# Patient Record
Sex: Male | Born: 1935
Health system: Southern US, Community
[De-identification: ages and names within clinical notes are randomized; demographics above are authoritative.]

## PROBLEM LIST (undated history)

## (undated) DIAGNOSIS — F32A Depression, unspecified: Secondary | ICD-10-CM

## (undated) DIAGNOSIS — I714 Abdominal aortic aneurysm, without rupture, unspecified: Secondary | ICD-10-CM

## (undated) DIAGNOSIS — I472 Ventricular tachycardia, unspecified: Secondary | ICD-10-CM

## (undated) DIAGNOSIS — K219 Gastro-esophageal reflux disease without esophagitis: Secondary | ICD-10-CM

## (undated) DIAGNOSIS — F0390 Unspecified dementia without behavioral disturbance: Secondary | ICD-10-CM

## (undated) DIAGNOSIS — R310 Gross hematuria: Secondary | ICD-10-CM

## (undated) DIAGNOSIS — I10 Essential (primary) hypertension: Secondary | ICD-10-CM

## (undated) DIAGNOSIS — K5731 Diverticulosis of large intestine without perforation or abscess with bleeding: Secondary | ICD-10-CM

## (undated) DIAGNOSIS — I729 Aneurysm of unspecified site: Secondary | ICD-10-CM

## (undated) DIAGNOSIS — F329 Major depressive disorder, single episode, unspecified: Secondary | ICD-10-CM

## (undated) DIAGNOSIS — E785 Hyperlipidemia, unspecified: Secondary | ICD-10-CM

## (undated) DIAGNOSIS — E119 Type 2 diabetes mellitus without complications: Secondary | ICD-10-CM

## (undated) DIAGNOSIS — I4891 Unspecified atrial fibrillation: Secondary | ICD-10-CM

## (undated) HISTORY — DX: Hyperlipidemia, unspecified: E78.5

## (undated) HISTORY — DX: Essential (primary) hypertension: I10

## (undated) HISTORY — DX: Ventricular tachycardia, unspecified: I47.20

## (undated) HISTORY — DX: Type 2 diabetes mellitus without complications: E11.9

## (undated) HISTORY — DX: Unspecified dementia, unspecified severity, without behavioral disturbance, psychotic disturbance, mood disturbance, and anxiety: F03.90

## (undated) HISTORY — DX: Gastro-esophageal reflux disease without esophagitis: K21.9

## (undated) HISTORY — DX: Unspecified atrial fibrillation: I48.91

## (undated) HISTORY — DX: Ventricular tachycardia: I47.2

---

## 2006-05-29 ENCOUNTER — Other Ambulatory Visit: Payer: Self-pay

## 2006-06-10 ENCOUNTER — Inpatient Hospital Stay: Payer: Self-pay | Admitting: General Practice

## 2006-08-20 ENCOUNTER — Ambulatory Visit: Payer: Self-pay | Admitting: Family Medicine

## 2006-08-24 ENCOUNTER — Ambulatory Visit: Payer: Self-pay | Admitting: Family Medicine

## 2006-09-11 ENCOUNTER — Ambulatory Visit: Payer: Self-pay | Admitting: Internal Medicine

## 2006-09-23 ENCOUNTER — Ambulatory Visit: Payer: Self-pay | Admitting: Family Medicine

## 2006-10-24 ENCOUNTER — Ambulatory Visit: Payer: Self-pay | Admitting: Family Medicine

## 2006-11-23 ENCOUNTER — Ambulatory Visit: Payer: Self-pay | Admitting: Family Medicine

## 2007-03-28 ENCOUNTER — Ambulatory Visit: Payer: Self-pay | Admitting: Family Medicine

## 2008-02-04 ENCOUNTER — Other Ambulatory Visit: Payer: Self-pay

## 2008-02-04 ENCOUNTER — Ambulatory Visit: Payer: Self-pay | Admitting: Ophthalmology

## 2008-02-16 ENCOUNTER — Ambulatory Visit: Payer: Self-pay | Admitting: Ophthalmology

## 2008-07-17 ENCOUNTER — Other Ambulatory Visit: Payer: Self-pay

## 2008-07-17 ENCOUNTER — Emergency Department: Payer: Self-pay | Admitting: Emergency Medicine

## 2009-10-14 ENCOUNTER — Inpatient Hospital Stay: Payer: Self-pay | Admitting: Internal Medicine

## 2009-11-21 ENCOUNTER — Ambulatory Visit: Payer: Self-pay | Admitting: Vascular Surgery

## 2009-11-28 ENCOUNTER — Inpatient Hospital Stay: Payer: Self-pay | Admitting: Vascular Surgery

## 2010-04-12 ENCOUNTER — Ambulatory Visit: Payer: Self-pay | Admitting: Vascular Surgery

## 2011-02-15 ENCOUNTER — Ambulatory Visit: Payer: Self-pay | Admitting: Vascular Surgery

## 2011-03-26 ENCOUNTER — Ambulatory Visit: Payer: Self-pay | Admitting: Family Medicine

## 2011-04-07 ENCOUNTER — Emergency Department: Payer: Self-pay | Admitting: Emergency Medicine

## 2011-09-10 ENCOUNTER — Emergency Department: Payer: Self-pay | Admitting: Unknown Physician Specialty

## 2011-12-07 ENCOUNTER — Ambulatory Visit: Payer: Self-pay | Admitting: Family Medicine

## 2011-12-26 DIAGNOSIS — R262 Difficulty in walking, not elsewhere classified: Secondary | ICD-10-CM | POA: Diagnosis not present

## 2011-12-26 DIAGNOSIS — M25569 Pain in unspecified knee: Secondary | ICD-10-CM | POA: Diagnosis not present

## 2011-12-26 DIAGNOSIS — M6281 Muscle weakness (generalized): Secondary | ICD-10-CM | POA: Diagnosis not present

## 2011-12-27 DIAGNOSIS — I4891 Unspecified atrial fibrillation: Secondary | ICD-10-CM | POA: Diagnosis not present

## 2011-12-28 DIAGNOSIS — M6281 Muscle weakness (generalized): Secondary | ICD-10-CM | POA: Diagnosis not present

## 2011-12-28 DIAGNOSIS — R262 Difficulty in walking, not elsewhere classified: Secondary | ICD-10-CM | POA: Diagnosis not present

## 2011-12-28 DIAGNOSIS — M25569 Pain in unspecified knee: Secondary | ICD-10-CM | POA: Diagnosis not present

## 2011-12-31 DIAGNOSIS — M6281 Muscle weakness (generalized): Secondary | ICD-10-CM | POA: Diagnosis not present

## 2011-12-31 DIAGNOSIS — M25569 Pain in unspecified knee: Secondary | ICD-10-CM | POA: Diagnosis not present

## 2011-12-31 DIAGNOSIS — R262 Difficulty in walking, not elsewhere classified: Secondary | ICD-10-CM | POA: Diagnosis not present

## 2012-01-02 DIAGNOSIS — M129 Arthropathy, unspecified: Secondary | ICD-10-CM | POA: Diagnosis not present

## 2012-01-02 DIAGNOSIS — I2510 Atherosclerotic heart disease of native coronary artery without angina pectoris: Secondary | ICD-10-CM | POA: Diagnosis not present

## 2012-01-02 DIAGNOSIS — IMO0002 Reserved for concepts with insufficient information to code with codable children: Secondary | ICD-10-CM | POA: Diagnosis not present

## 2012-01-02 DIAGNOSIS — E119 Type 2 diabetes mellitus without complications: Secondary | ICD-10-CM | POA: Diagnosis not present

## 2012-01-04 DIAGNOSIS — M6281 Muscle weakness (generalized): Secondary | ICD-10-CM | POA: Diagnosis not present

## 2012-01-04 DIAGNOSIS — R262 Difficulty in walking, not elsewhere classified: Secondary | ICD-10-CM | POA: Diagnosis not present

## 2012-01-04 DIAGNOSIS — M25569 Pain in unspecified knee: Secondary | ICD-10-CM | POA: Diagnosis not present

## 2012-01-07 DIAGNOSIS — M25569 Pain in unspecified knee: Secondary | ICD-10-CM | POA: Diagnosis not present

## 2012-01-07 DIAGNOSIS — M6281 Muscle weakness (generalized): Secondary | ICD-10-CM | POA: Diagnosis not present

## 2012-01-07 DIAGNOSIS — R262 Difficulty in walking, not elsewhere classified: Secondary | ICD-10-CM | POA: Diagnosis not present

## 2012-01-08 DIAGNOSIS — M25569 Pain in unspecified knee: Secondary | ICD-10-CM | POA: Diagnosis not present

## 2012-01-08 DIAGNOSIS — M6281 Muscle weakness (generalized): Secondary | ICD-10-CM | POA: Diagnosis not present

## 2012-01-08 DIAGNOSIS — R262 Difficulty in walking, not elsewhere classified: Secondary | ICD-10-CM | POA: Diagnosis not present

## 2012-01-09 DIAGNOSIS — IMO0002 Reserved for concepts with insufficient information to code with codable children: Secondary | ICD-10-CM | POA: Diagnosis not present

## 2012-01-09 DIAGNOSIS — M1990 Unspecified osteoarthritis, unspecified site: Secondary | ICD-10-CM | POA: Diagnosis not present

## 2012-01-10 ENCOUNTER — Inpatient Hospital Stay: Payer: Self-pay | Admitting: *Deleted

## 2012-01-10 DIAGNOSIS — I4891 Unspecified atrial fibrillation: Secondary | ICD-10-CM | POA: Diagnosis not present

## 2012-01-10 DIAGNOSIS — E785 Hyperlipidemia, unspecified: Secondary | ICD-10-CM | POA: Diagnosis not present

## 2012-01-10 DIAGNOSIS — E119 Type 2 diabetes mellitus without complications: Secondary | ICD-10-CM | POA: Diagnosis not present

## 2012-01-10 DIAGNOSIS — R58 Hemorrhage, not elsewhere classified: Secondary | ICD-10-CM | POA: Diagnosis not present

## 2012-01-10 DIAGNOSIS — I6529 Occlusion and stenosis of unspecified carotid artery: Secondary | ICD-10-CM | POA: Diagnosis not present

## 2012-01-10 DIAGNOSIS — S3981XA Other specified injuries of abdomen, initial encounter: Secondary | ICD-10-CM | POA: Diagnosis present

## 2012-01-10 DIAGNOSIS — M6281 Muscle weakness (generalized): Secondary | ICD-10-CM | POA: Diagnosis not present

## 2012-01-10 DIAGNOSIS — D649 Anemia, unspecified: Secondary | ICD-10-CM | POA: Diagnosis present

## 2012-01-10 DIAGNOSIS — S20219A Contusion of unspecified front wall of thorax, initial encounter: Secondary | ICD-10-CM | POA: Diagnosis not present

## 2012-01-10 DIAGNOSIS — Z7901 Long term (current) use of anticoagulants: Secondary | ICD-10-CM | POA: Diagnosis not present

## 2012-01-10 DIAGNOSIS — IMO0001 Reserved for inherently not codable concepts without codable children: Secondary | ICD-10-CM | POA: Diagnosis not present

## 2012-01-10 DIAGNOSIS — D689 Coagulation defect, unspecified: Secondary | ICD-10-CM | POA: Diagnosis present

## 2012-01-10 DIAGNOSIS — S301XXA Contusion of abdominal wall, initial encounter: Secondary | ICD-10-CM | POA: Diagnosis not present

## 2012-01-10 DIAGNOSIS — Z9181 History of falling: Secondary | ICD-10-CM | POA: Diagnosis not present

## 2012-01-10 DIAGNOSIS — S3011XA Contusion of abdominal wall, initial encounter: Secondary | ICD-10-CM | POA: Diagnosis not present

## 2012-01-10 DIAGNOSIS — I714 Abdominal aortic aneurysm, without rupture: Secondary | ICD-10-CM | POA: Diagnosis present

## 2012-01-10 DIAGNOSIS — R55 Syncope and collapse: Secondary | ICD-10-CM | POA: Diagnosis not present

## 2012-01-10 DIAGNOSIS — Z87891 Personal history of nicotine dependence: Secondary | ICD-10-CM | POA: Diagnosis not present

## 2012-01-10 DIAGNOSIS — Z823 Family history of stroke: Secondary | ICD-10-CM | POA: Diagnosis not present

## 2012-01-10 DIAGNOSIS — R279 Unspecified lack of coordination: Secondary | ICD-10-CM | POA: Diagnosis not present

## 2012-01-10 DIAGNOSIS — R791 Abnormal coagulation profile: Secondary | ICD-10-CM | POA: Diagnosis not present

## 2012-01-10 DIAGNOSIS — R262 Difficulty in walking, not elsewhere classified: Secondary | ICD-10-CM | POA: Diagnosis not present

## 2012-01-10 DIAGNOSIS — F068 Other specified mental disorders due to known physiological condition: Secondary | ICD-10-CM | POA: Diagnosis not present

## 2012-01-10 DIAGNOSIS — Z880 Allergy status to penicillin: Secondary | ICD-10-CM | POA: Diagnosis not present

## 2012-01-10 DIAGNOSIS — I10 Essential (primary) hypertension: Secondary | ICD-10-CM | POA: Diagnosis not present

## 2012-01-10 DIAGNOSIS — Z79899 Other long term (current) drug therapy: Secondary | ICD-10-CM | POA: Diagnosis not present

## 2012-01-10 LAB — COMPREHENSIVE METABOLIC PANEL
Albumin: 3.5 g/dL (ref 3.4–5.0)
Alkaline Phosphatase: 44 U/L — ABNORMAL LOW (ref 50–136)
Bilirubin,Total: 1 mg/dL (ref 0.2–1.0)
Calcium, Total: 9.1 mg/dL (ref 8.5–10.1)
EGFR (African American): >60
EGFR (Non-African Amer.): 57 — ABNORMAL LOW
Glucose: 190 mg/dL — ABNORMAL HIGH (ref 65–99)
Osmolality: 292 (ref 275–301)
SGOT(AST): 16 U/L (ref 15–37)
SGPT (ALT): 18 U/L

## 2012-01-10 LAB — URINALYSIS, COMPLETE
Hyaline Cast: 20
Leukocyte Esterase: NEGATIVE
Nitrite: NEGATIVE
Ph: 5 (ref 4.5–8.0)
Protein: NEGATIVE
RBC,UR: 4 /HPF (ref 0–5)
Specific Gravity: 1.02 (ref 1.003–1.030)

## 2012-01-10 LAB — CBC
HGB: 12.9 g/dL — ABNORMAL LOW (ref 13.0–18.0)
MCH: 34 pg (ref 26.0–34.0)
MCHC: 34 g/dL (ref 32.0–36.0)
MCV: 100 fL (ref 80–100)
Platelet: 195 10*3/uL (ref 150–440)
RBC: 3.78 10*6/uL — ABNORMAL LOW (ref 4.40–5.90)
RDW: 14.2 % (ref 11.5–14.5)
WBC: 18 10*3/uL — ABNORMAL HIGH (ref 3.8–10.6)

## 2012-01-10 LAB — PROTIME-INR
INR: 10.2
Prothrombin Time: 78.9 secs — ABNORMAL HIGH (ref 11.5–14.7)

## 2012-01-10 LAB — DIGOXIN LEVEL: Digoxin: 0.77 ng/mL

## 2012-01-10 LAB — TROPONIN I: Troponin-I: <0.02 ng/mL

## 2012-01-10 LAB — LIPASE, BLOOD: Lipase: 490 U/L — ABNORMAL HIGH (ref 73–393)

## 2012-01-11 LAB — BASIC METABOLIC PANEL
Anion Gap: 9 (ref 7–16)
BUN: 29 mg/dL — ABNORMAL HIGH (ref 7–18)
Calcium, Total: 8.9 mg/dL (ref 8.5–10.1)
Co2: 28 mmol/L (ref 21–32)
EGFR (African American): >60
EGFR (Non-African Amer.): >60
Glucose: 170 mg/dL — ABNORMAL HIGH (ref 65–99)

## 2012-01-11 LAB — CBC WITH DIFFERENTIAL/PLATELET
Basophil #: 0 10*3/uL (ref 0.0–0.1)
Eosinophil %: 0.5 %
Lymphocyte %: 13.4 %
MCHC: 33.7 g/dL (ref 32.0–36.0)
MCV: 99 fL (ref 80–100)
Monocyte %: 10.7 %
Neutrophil %: 75.2 %
Platelet: 145 10*3/uL — ABNORMAL LOW (ref 150–440)
RBC: 3.21 10*6/uL — ABNORMAL LOW (ref 4.40–5.90)
RDW: 13.8 % (ref 11.5–14.5)
WBC: 13.3 10*3/uL — ABNORMAL HIGH (ref 3.8–10.6)

## 2012-01-11 LAB — PROTIME-INR: Prothrombin Time: 30.7 secs — ABNORMAL HIGH (ref 11.5–14.7)

## 2012-01-13 LAB — CBC WITH DIFFERENTIAL/PLATELET
Basophil #: 0 10*3/uL (ref 0.0–0.1)
Basophil %: 0.3 %
Eosinophil %: 1.2 %
HCT: 31.7 % — ABNORMAL LOW (ref 40.0–52.0)
HGB: 10.7 g/dL — ABNORMAL LOW (ref 13.0–18.0)
Lymphocyte #: 2.1 10*3/uL (ref 1.0–3.6)
MCHC: 33.8 g/dL (ref 32.0–36.0)
MCV: 100 fL (ref 80–100)
Monocyte %: 11.9 %
Neutrophil #: 5.8 10*3/uL (ref 1.4–6.5)
Platelet: 170 10*3/uL (ref 150–440)
RDW: 14.1 % (ref 11.5–14.5)
WBC: 9.1 10*3/uL (ref 3.8–10.6)

## 2012-01-13 LAB — BASIC METABOLIC PANEL
BUN: 16 mg/dL (ref 7–18)
Calcium, Total: 8.7 mg/dL (ref 8.5–10.1)
Co2: 30 mmol/L (ref 21–32)
EGFR (African American): >60
Glucose: 124 mg/dL — ABNORMAL HIGH (ref 65–99)
Osmolality: 282 (ref 275–301)
Potassium: 3.4 mmol/L — ABNORMAL LOW (ref 3.5–5.1)

## 2012-01-13 LAB — PROTIME-INR
INR: 1.9
Prothrombin Time: 22.3 secs — ABNORMAL HIGH (ref 11.5–14.7)

## 2012-01-15 DIAGNOSIS — D649 Anemia, unspecified: Secondary | ICD-10-CM | POA: Diagnosis not present

## 2012-01-15 DIAGNOSIS — Z9181 History of falling: Secondary | ICD-10-CM | POA: Diagnosis not present

## 2012-01-15 DIAGNOSIS — I10 Essential (primary) hypertension: Secondary | ICD-10-CM | POA: Diagnosis not present

## 2012-01-15 DIAGNOSIS — M6281 Muscle weakness (generalized): Secondary | ICD-10-CM | POA: Diagnosis not present

## 2012-01-15 DIAGNOSIS — R279 Unspecified lack of coordination: Secondary | ICD-10-CM | POA: Diagnosis not present

## 2012-01-15 DIAGNOSIS — D689 Coagulation defect, unspecified: Secondary | ICD-10-CM | POA: Diagnosis not present

## 2012-01-15 DIAGNOSIS — S3011XA Contusion of abdominal wall, initial encounter: Secondary | ICD-10-CM | POA: Diagnosis not present

## 2012-01-15 DIAGNOSIS — IMO0001 Reserved for inherently not codable concepts without codable children: Secondary | ICD-10-CM | POA: Diagnosis not present

## 2012-01-15 DIAGNOSIS — R262 Difficulty in walking, not elsewhere classified: Secondary | ICD-10-CM | POA: Diagnosis not present

## 2012-01-15 DIAGNOSIS — M129 Arthropathy, unspecified: Secondary | ICD-10-CM | POA: Diagnosis not present

## 2012-01-15 DIAGNOSIS — S301XXA Contusion of abdominal wall, initial encounter: Secondary | ICD-10-CM | POA: Diagnosis not present

## 2012-01-15 DIAGNOSIS — I4891 Unspecified atrial fibrillation: Secondary | ICD-10-CM | POA: Diagnosis not present

## 2012-01-15 DIAGNOSIS — M5137 Other intervertebral disc degeneration, lumbosacral region: Secondary | ICD-10-CM | POA: Diagnosis not present

## 2012-01-15 DIAGNOSIS — I2510 Atherosclerotic heart disease of native coronary artery without angina pectoris: Secondary | ICD-10-CM | POA: Diagnosis not present

## 2012-01-15 DIAGNOSIS — M5126 Other intervertebral disc displacement, lumbar region: Secondary | ICD-10-CM | POA: Diagnosis not present

## 2012-01-15 DIAGNOSIS — T148XXA Other injury of unspecified body region, initial encounter: Secondary | ICD-10-CM | POA: Diagnosis not present

## 2012-01-15 DIAGNOSIS — F068 Other specified mental disorders due to known physiological condition: Secondary | ICD-10-CM | POA: Diagnosis not present

## 2012-01-15 DIAGNOSIS — M47817 Spondylosis without myelopathy or radiculopathy, lumbosacral region: Secondary | ICD-10-CM | POA: Diagnosis not present

## 2012-01-15 DIAGNOSIS — E119 Type 2 diabetes mellitus without complications: Secondary | ICD-10-CM | POA: Diagnosis not present

## 2012-01-15 DIAGNOSIS — R58 Hemorrhage, not elsewhere classified: Secondary | ICD-10-CM | POA: Diagnosis not present

## 2012-01-15 LAB — HEMOGLOBIN: HGB: 10.6 g/dL — ABNORMAL LOW (ref 13.0–18.0)

## 2012-01-21 DIAGNOSIS — M129 Arthropathy, unspecified: Secondary | ICD-10-CM | POA: Diagnosis not present

## 2012-01-21 DIAGNOSIS — I10 Essential (primary) hypertension: Secondary | ICD-10-CM | POA: Diagnosis not present

## 2012-01-21 DIAGNOSIS — I2510 Atherosclerotic heart disease of native coronary artery without angina pectoris: Secondary | ICD-10-CM | POA: Diagnosis not present

## 2012-01-21 DIAGNOSIS — E119 Type 2 diabetes mellitus without complications: Secondary | ICD-10-CM | POA: Diagnosis not present

## 2012-01-25 DIAGNOSIS — M47817 Spondylosis without myelopathy or radiculopathy, lumbosacral region: Secondary | ICD-10-CM | POA: Diagnosis not present

## 2012-01-25 DIAGNOSIS — I4891 Unspecified atrial fibrillation: Secondary | ICD-10-CM | POA: Diagnosis not present

## 2012-01-25 DIAGNOSIS — R262 Difficulty in walking, not elsewhere classified: Secondary | ICD-10-CM | POA: Diagnosis not present

## 2012-01-25 DIAGNOSIS — S301XXA Contusion of abdominal wall, initial encounter: Secondary | ICD-10-CM | POA: Diagnosis not present

## 2012-01-25 DIAGNOSIS — D689 Coagulation defect, unspecified: Secondary | ICD-10-CM | POA: Diagnosis not present

## 2012-01-25 DIAGNOSIS — F068 Other specified mental disorders due to known physiological condition: Secondary | ICD-10-CM | POA: Diagnosis not present

## 2012-01-25 DIAGNOSIS — M6281 Muscle weakness (generalized): Secondary | ICD-10-CM | POA: Diagnosis not present

## 2012-01-25 DIAGNOSIS — M5126 Other intervertebral disc displacement, lumbar region: Secondary | ICD-10-CM | POA: Diagnosis not present

## 2012-01-25 DIAGNOSIS — I10 Essential (primary) hypertension: Secondary | ICD-10-CM | POA: Diagnosis not present

## 2012-01-25 DIAGNOSIS — Z9181 History of falling: Secondary | ICD-10-CM | POA: Diagnosis not present

## 2012-01-25 DIAGNOSIS — R279 Unspecified lack of coordination: Secondary | ICD-10-CM | POA: Diagnosis not present

## 2012-02-11 ENCOUNTER — Ambulatory Visit: Payer: Self-pay | Admitting: Unknown Physician Specialty

## 2012-02-11 DIAGNOSIS — M47817 Spondylosis without myelopathy or radiculopathy, lumbosacral region: Secondary | ICD-10-CM | POA: Diagnosis not present

## 2012-02-11 DIAGNOSIS — M5126 Other intervertebral disc displacement, lumbar region: Secondary | ICD-10-CM | POA: Diagnosis not present

## 2012-02-13 DIAGNOSIS — IMO0002 Reserved for concepts with insufficient information to code with codable children: Secondary | ICD-10-CM | POA: Diagnosis not present

## 2012-02-13 DIAGNOSIS — M4800 Spinal stenosis, site unspecified: Secondary | ICD-10-CM | POA: Diagnosis not present

## 2012-02-14 DIAGNOSIS — M6281 Muscle weakness (generalized): Secondary | ICD-10-CM | POA: Diagnosis not present

## 2012-02-14 DIAGNOSIS — R262 Difficulty in walking, not elsewhere classified: Secondary | ICD-10-CM | POA: Diagnosis not present

## 2012-02-14 DIAGNOSIS — M25559 Pain in unspecified hip: Secondary | ICD-10-CM | POA: Diagnosis not present

## 2012-02-18 DIAGNOSIS — M6281 Muscle weakness (generalized): Secondary | ICD-10-CM | POA: Diagnosis not present

## 2012-02-18 DIAGNOSIS — R262 Difficulty in walking, not elsewhere classified: Secondary | ICD-10-CM | POA: Diagnosis not present

## 2012-02-18 DIAGNOSIS — M25559 Pain in unspecified hip: Secondary | ICD-10-CM | POA: Diagnosis not present

## 2012-02-20 DIAGNOSIS — M6281 Muscle weakness (generalized): Secondary | ICD-10-CM | POA: Diagnosis not present

## 2012-02-20 DIAGNOSIS — R262 Difficulty in walking, not elsewhere classified: Secondary | ICD-10-CM | POA: Diagnosis not present

## 2012-02-20 DIAGNOSIS — M25559 Pain in unspecified hip: Secondary | ICD-10-CM | POA: Diagnosis not present

## 2012-02-25 DIAGNOSIS — R109 Unspecified abdominal pain: Secondary | ICD-10-CM | POA: Diagnosis not present

## 2012-02-25 DIAGNOSIS — M129 Arthropathy, unspecified: Secondary | ICD-10-CM | POA: Diagnosis not present

## 2012-02-25 DIAGNOSIS — I2510 Atherosclerotic heart disease of native coronary artery without angina pectoris: Secondary | ICD-10-CM | POA: Diagnosis not present

## 2012-02-25 DIAGNOSIS — M549 Dorsalgia, unspecified: Secondary | ICD-10-CM | POA: Diagnosis not present

## 2012-02-25 DIAGNOSIS — M6281 Muscle weakness (generalized): Secondary | ICD-10-CM | POA: Diagnosis not present

## 2012-02-25 DIAGNOSIS — R262 Difficulty in walking, not elsewhere classified: Secondary | ICD-10-CM | POA: Diagnosis not present

## 2012-02-25 DIAGNOSIS — M25559 Pain in unspecified hip: Secondary | ICD-10-CM | POA: Diagnosis not present

## 2012-02-27 DIAGNOSIS — M6281 Muscle weakness (generalized): Secondary | ICD-10-CM | POA: Diagnosis not present

## 2012-02-27 DIAGNOSIS — M25559 Pain in unspecified hip: Secondary | ICD-10-CM | POA: Diagnosis not present

## 2012-02-27 DIAGNOSIS — R262 Difficulty in walking, not elsewhere classified: Secondary | ICD-10-CM | POA: Diagnosis not present

## 2012-02-28 DIAGNOSIS — I4891 Unspecified atrial fibrillation: Secondary | ICD-10-CM | POA: Diagnosis not present

## 2012-02-28 DIAGNOSIS — N289 Disorder of kidney and ureter, unspecified: Secondary | ICD-10-CM | POA: Diagnosis not present

## 2012-02-28 DIAGNOSIS — R109 Unspecified abdominal pain: Secondary | ICD-10-CM | POA: Diagnosis not present

## 2012-02-29 ENCOUNTER — Inpatient Hospital Stay: Payer: Self-pay | Admitting: Internal Medicine

## 2012-02-29 DIAGNOSIS — N1330 Unspecified hydronephrosis: Secondary | ICD-10-CM | POA: Diagnosis not present

## 2012-02-29 DIAGNOSIS — R339 Retention of urine, unspecified: Secondary | ICD-10-CM | POA: Diagnosis not present

## 2012-02-29 DIAGNOSIS — Z66 Do not resuscitate: Secondary | ICD-10-CM | POA: Diagnosis present

## 2012-02-29 DIAGNOSIS — Z880 Allergy status to penicillin: Secondary | ICD-10-CM | POA: Diagnosis not present

## 2012-02-29 DIAGNOSIS — I714 Abdominal aortic aneurysm, without rupture: Secondary | ICD-10-CM | POA: Diagnosis not present

## 2012-02-29 DIAGNOSIS — F0390 Unspecified dementia without behavioral disturbance: Secondary | ICD-10-CM | POA: Diagnosis present

## 2012-02-29 DIAGNOSIS — Z7982 Long term (current) use of aspirin: Secondary | ICD-10-CM | POA: Diagnosis not present

## 2012-02-29 DIAGNOSIS — K4090 Unilateral inguinal hernia, without obstruction or gangrene, not specified as recurrent: Secondary | ICD-10-CM | POA: Diagnosis present

## 2012-02-29 DIAGNOSIS — N12 Tubulo-interstitial nephritis, not specified as acute or chronic: Secondary | ICD-10-CM | POA: Diagnosis present

## 2012-02-29 DIAGNOSIS — E785 Hyperlipidemia, unspecified: Secondary | ICD-10-CM | POA: Diagnosis not present

## 2012-02-29 DIAGNOSIS — Z9181 History of falling: Secondary | ICD-10-CM | POA: Diagnosis not present

## 2012-02-29 DIAGNOSIS — R6889 Other general symptoms and signs: Secondary | ICD-10-CM | POA: Diagnosis not present

## 2012-02-29 DIAGNOSIS — D689 Coagulation defect, unspecified: Secondary | ICD-10-CM | POA: Diagnosis not present

## 2012-02-29 DIAGNOSIS — N390 Urinary tract infection, site not specified: Secondary | ICD-10-CM | POA: Diagnosis not present

## 2012-02-29 DIAGNOSIS — E119 Type 2 diabetes mellitus without complications: Secondary | ICD-10-CM | POA: Diagnosis not present

## 2012-02-29 DIAGNOSIS — D72829 Elevated white blood cell count, unspecified: Secondary | ICD-10-CM | POA: Diagnosis not present

## 2012-02-29 DIAGNOSIS — Z823 Family history of stroke: Secondary | ICD-10-CM | POA: Diagnosis not present

## 2012-02-29 DIAGNOSIS — M6281 Muscle weakness (generalized): Secondary | ICD-10-CM | POA: Diagnosis not present

## 2012-02-29 DIAGNOSIS — N179 Acute kidney failure, unspecified: Secondary | ICD-10-CM | POA: Diagnosis present

## 2012-02-29 DIAGNOSIS — B952 Enterococcus as the cause of diseases classified elsewhere: Secondary | ICD-10-CM | POA: Diagnosis not present

## 2012-02-29 DIAGNOSIS — N139 Obstructive and reflux uropathy, unspecified: Secondary | ICD-10-CM | POA: Diagnosis not present

## 2012-02-29 DIAGNOSIS — M25559 Pain in unspecified hip: Secondary | ICD-10-CM | POA: Diagnosis not present

## 2012-02-29 DIAGNOSIS — Z96659 Presence of unspecified artificial knee joint: Secondary | ICD-10-CM | POA: Diagnosis not present

## 2012-02-29 DIAGNOSIS — N10 Acute tubulo-interstitial nephritis: Secondary | ICD-10-CM | POA: Diagnosis not present

## 2012-02-29 DIAGNOSIS — K7689 Other specified diseases of liver: Secondary | ICD-10-CM | POA: Diagnosis not present

## 2012-02-29 DIAGNOSIS — N320 Bladder-neck obstruction: Secondary | ICD-10-CM | POA: Diagnosis present

## 2012-02-29 DIAGNOSIS — Z87891 Personal history of nicotine dependence: Secondary | ICD-10-CM | POA: Diagnosis not present

## 2012-02-29 DIAGNOSIS — I10 Essential (primary) hypertension: Secondary | ICD-10-CM | POA: Diagnosis not present

## 2012-02-29 DIAGNOSIS — R109 Unspecified abdominal pain: Secondary | ICD-10-CM | POA: Diagnosis not present

## 2012-02-29 DIAGNOSIS — Z79899 Other long term (current) drug therapy: Secondary | ICD-10-CM | POA: Diagnosis not present

## 2012-02-29 DIAGNOSIS — S301XXA Contusion of abdominal wall, initial encounter: Secondary | ICD-10-CM | POA: Diagnosis not present

## 2012-02-29 DIAGNOSIS — I4891 Unspecified atrial fibrillation: Secondary | ICD-10-CM | POA: Diagnosis not present

## 2012-02-29 DIAGNOSIS — F068 Other specified mental disorders due to known physiological condition: Secondary | ICD-10-CM | POA: Diagnosis not present

## 2012-02-29 DIAGNOSIS — R262 Difficulty in walking, not elsewhere classified: Secondary | ICD-10-CM | POA: Diagnosis not present

## 2012-02-29 LAB — CBC
HCT: 46.5 % (ref 40.0–52.0)
MCH: 33.3 pg (ref 26.0–34.0)
MCHC: 33.3 g/dL (ref 32.0–36.0)
MCV: 100 fL (ref 80–100)
Platelet: 161 10*3/uL (ref 150–440)
RBC: 4.65 10*6/uL (ref 4.40–5.90)
RDW: 15.1 % — ABNORMAL HIGH (ref 11.5–14.5)
WBC: 14.8 10*3/uL — ABNORMAL HIGH (ref 3.8–10.6)

## 2012-02-29 LAB — COMPREHENSIVE METABOLIC PANEL
Albumin: 2.9 g/dL — ABNORMAL LOW (ref 3.4–5.0)
Alkaline Phosphatase: 59 U/L (ref 50–136)
BUN: 54 mg/dL — ABNORMAL HIGH (ref 7–18)
Bilirubin,Total: 0.4 mg/dL (ref 0.2–1.0)
Co2: 21 mmol/L (ref 21–32)
Creatinine: 2.89 mg/dL — ABNORMAL HIGH (ref 0.60–1.30)
EGFR (African American): 27 — ABNORMAL LOW
Glucose: 235 mg/dL — ABNORMAL HIGH (ref 65–99)
Potassium: 4 mmol/L (ref 3.5–5.1)
Sodium: 140 mmol/L (ref 136–145)
Total Protein: 6.4 g/dL (ref 6.4–8.2)

## 2012-02-29 LAB — URINALYSIS, COMPLETE
Bacteria: NONE SEEN
Bilirubin,UR: NEGATIVE
Ketone: NEGATIVE
Nitrite: NEGATIVE
Ph: 5 (ref 4.5–8.0)
Protein: NEGATIVE
Specific Gravity: 1.009 (ref 1.003–1.030)
Squamous Epithelial: NONE SEEN

## 2012-02-29 LAB — PROTIME-INR
INR: 1.2
Prothrombin Time: 16 secs — ABNORMAL HIGH (ref 11.5–14.7)

## 2012-02-29 LAB — CK: CK, Total: 16 U/L — ABNORMAL LOW (ref 35–232)

## 2012-03-01 ENCOUNTER — Ambulatory Visit: Payer: Self-pay | Admitting: Urology

## 2012-03-01 LAB — CBC WITH DIFFERENTIAL/PLATELET
Basophil #: 0 10*3/uL (ref 0.0–0.1)
Basophil %: 0 %
Eosinophil #: 0.2 10*3/uL (ref 0.0–0.7)
Eosinophil %: 1.4 %
HCT: 43.4 % (ref 40.0–52.0)
HGB: 14.4 g/dL (ref 13.0–18.0)
MCH: 33.3 pg (ref 26.0–34.0)
MCHC: 33.2 g/dL (ref 32.0–36.0)
Monocyte #: 1.2 10*3/uL — ABNORMAL HIGH (ref 0.0–0.7)
Monocyte %: 10.7 %
Neutrophil #: 8.5 10*3/uL — ABNORMAL HIGH (ref 1.4–6.5)
Neutrophil %: 77.6 %
Platelet: 137 10*3/uL — ABNORMAL LOW (ref 150–440)
RBC: 4.33 10*6/uL — ABNORMAL LOW (ref 4.40–5.90)

## 2012-03-01 LAB — BASIC METABOLIC PANEL
BUN: 30 mg/dL — ABNORMAL HIGH (ref 7–18)
EGFR (African American): >60
EGFR (Non-African Amer.): 53 — ABNORMAL LOW
Glucose: 137 mg/dL — ABNORMAL HIGH (ref 65–99)
Potassium: 3.8 mmol/L (ref 3.5–5.1)
Sodium: 147 mmol/L — ABNORMAL HIGH (ref 136–145)

## 2012-03-01 LAB — MAGNESIUM: Magnesium: 1.6 mg/dL — ABNORMAL LOW

## 2012-03-02 LAB — BASIC METABOLIC PANEL
Anion Gap: 12 (ref 7–16)
Calcium, Total: 8.1 mg/dL — ABNORMAL LOW (ref 8.5–10.1)
Chloride: 109 mmol/L — ABNORMAL HIGH (ref 98–107)
Co2: 24 mmol/L (ref 21–32)
Creatinine: 0.8 mg/dL (ref 0.60–1.30)
EGFR (African American): >60
Glucose: 123 mg/dL — ABNORMAL HIGH (ref 65–99)
Potassium: 3.1 mmol/L — ABNORMAL LOW (ref 3.5–5.1)
Sodium: 145 mmol/L (ref 136–145)

## 2012-03-02 LAB — CBC WITH DIFFERENTIAL/PLATELET
Basophil #: 0 10*3/uL (ref 0.0–0.1)
Basophil %: 0.2 %
Eosinophil #: 0.3 10*3/uL (ref 0.0–0.7)
Eosinophil %: 2.9 %
Lymphocyte #: 1.3 10*3/uL (ref 1.0–3.6)
Lymphocyte %: 12.8 %
MCV: 98 fL (ref 80–100)
Monocyte %: 10.6 %
Neutrophil #: 7.3 10*3/uL — ABNORMAL HIGH (ref 1.4–6.5)
Platelet: 125 10*3/uL — ABNORMAL LOW (ref 150–440)
RDW: 14.6 % — ABNORMAL HIGH (ref 11.5–14.5)
WBC: 10 10*3/uL (ref 3.8–10.6)

## 2012-03-02 LAB — URINALYSIS, COMPLETE
Bilirubin,UR: NEGATIVE
Glucose,UR: NEGATIVE mg/dL (ref 0–75)
Ketone: NEGATIVE
Ph: 5 (ref 4.5–8.0)
RBC,UR: 71 /HPF (ref 0–5)
Squamous Epithelial: <1
WBC UR: 77 /HPF (ref 0–5)

## 2012-03-02 LAB — MAGNESIUM: Magnesium: 1.2 mg/dL — ABNORMAL LOW

## 2012-03-04 DIAGNOSIS — R6889 Other general symptoms and signs: Secondary | ICD-10-CM | POA: Diagnosis not present

## 2012-03-04 DIAGNOSIS — D72829 Elevated white blood cell count, unspecified: Secondary | ICD-10-CM | POA: Diagnosis not present

## 2012-03-04 DIAGNOSIS — F0390 Unspecified dementia without behavioral disturbance: Secondary | ICD-10-CM | POA: Diagnosis present

## 2012-03-04 DIAGNOSIS — B952 Enterococcus as the cause of diseases classified elsewhere: Secondary | ICD-10-CM | POA: Diagnosis not present

## 2012-03-04 DIAGNOSIS — F068 Other specified mental disorders due to known physiological condition: Secondary | ICD-10-CM | POA: Diagnosis not present

## 2012-03-04 DIAGNOSIS — Z66 Do not resuscitate: Secondary | ICD-10-CM | POA: Diagnosis present

## 2012-03-04 DIAGNOSIS — Z880 Allergy status to penicillin: Secondary | ICD-10-CM | POA: Diagnosis not present

## 2012-03-04 DIAGNOSIS — Z79899 Other long term (current) drug therapy: Secondary | ICD-10-CM | POA: Diagnosis not present

## 2012-03-04 DIAGNOSIS — Z823 Family history of stroke: Secondary | ICD-10-CM | POA: Diagnosis not present

## 2012-03-04 DIAGNOSIS — N3289 Other specified disorders of bladder: Secondary | ICD-10-CM | POA: Diagnosis not present

## 2012-03-04 DIAGNOSIS — T83511A Infection and inflammatory reaction due to indwelling urethral catheter, initial encounter: Secondary | ICD-10-CM | POA: Diagnosis present

## 2012-03-04 DIAGNOSIS — Z9181 History of falling: Secondary | ICD-10-CM | POA: Diagnosis not present

## 2012-03-04 DIAGNOSIS — S301XXA Contusion of abdominal wall, initial encounter: Secondary | ICD-10-CM | POA: Diagnosis not present

## 2012-03-04 DIAGNOSIS — Z96659 Presence of unspecified artificial knee joint: Secondary | ICD-10-CM | POA: Diagnosis not present

## 2012-03-04 DIAGNOSIS — I10 Essential (primary) hypertension: Secondary | ICD-10-CM | POA: Diagnosis not present

## 2012-03-04 DIAGNOSIS — E785 Hyperlipidemia, unspecified: Secondary | ICD-10-CM | POA: Diagnosis present

## 2012-03-04 DIAGNOSIS — R339 Retention of urine, unspecified: Secondary | ICD-10-CM | POA: Diagnosis present

## 2012-03-04 DIAGNOSIS — I4891 Unspecified atrial fibrillation: Secondary | ICD-10-CM | POA: Diagnosis not present

## 2012-03-04 DIAGNOSIS — K219 Gastro-esophageal reflux disease without esophagitis: Secondary | ICD-10-CM | POA: Diagnosis present

## 2012-03-04 DIAGNOSIS — N139 Obstructive and reflux uropathy, unspecified: Secondary | ICD-10-CM | POA: Diagnosis present

## 2012-03-04 DIAGNOSIS — D689 Coagulation defect, unspecified: Secondary | ICD-10-CM | POA: Diagnosis not present

## 2012-03-04 DIAGNOSIS — E119 Type 2 diabetes mellitus without complications: Secondary | ICD-10-CM | POA: Diagnosis not present

## 2012-03-04 DIAGNOSIS — T8389XA Other specified complication of genitourinary prosthetic devices, implants and grafts, initial encounter: Secondary | ICD-10-CM | POA: Diagnosis not present

## 2012-03-04 DIAGNOSIS — R109 Unspecified abdominal pain: Secondary | ICD-10-CM | POA: Diagnosis not present

## 2012-03-04 DIAGNOSIS — N138 Other obstructive and reflux uropathy: Secondary | ICD-10-CM | POA: Diagnosis present

## 2012-03-04 DIAGNOSIS — R262 Difficulty in walking, not elsewhere classified: Secondary | ICD-10-CM | POA: Diagnosis not present

## 2012-03-04 DIAGNOSIS — N179 Acute kidney failure, unspecified: Secondary | ICD-10-CM | POA: Diagnosis not present

## 2012-03-04 DIAGNOSIS — Z87440 Personal history of urinary (tract) infections: Secondary | ICD-10-CM | POA: Diagnosis not present

## 2012-03-04 DIAGNOSIS — N390 Urinary tract infection, site not specified: Secondary | ICD-10-CM | POA: Diagnosis present

## 2012-03-04 DIAGNOSIS — Z7982 Long term (current) use of aspirin: Secondary | ICD-10-CM | POA: Diagnosis not present

## 2012-03-04 DIAGNOSIS — M6281 Muscle weakness (generalized): Secondary | ICD-10-CM | POA: Diagnosis not present

## 2012-03-04 DIAGNOSIS — N1330 Unspecified hydronephrosis: Secondary | ICD-10-CM | POA: Diagnosis not present

## 2012-03-06 LAB — CULTURE, BLOOD (SINGLE)

## 2012-03-09 ENCOUNTER — Ambulatory Visit: Payer: Self-pay | Admitting: Urology

## 2012-03-09 ENCOUNTER — Inpatient Hospital Stay: Payer: Self-pay | Admitting: *Deleted

## 2012-03-09 DIAGNOSIS — Z66 Do not resuscitate: Secondary | ICD-10-CM | POA: Diagnosis present

## 2012-03-09 DIAGNOSIS — R109 Unspecified abdominal pain: Secondary | ICD-10-CM | POA: Diagnosis not present

## 2012-03-09 DIAGNOSIS — Z96659 Presence of unspecified artificial knee joint: Secondary | ICD-10-CM | POA: Diagnosis not present

## 2012-03-09 DIAGNOSIS — N139 Obstructive and reflux uropathy, unspecified: Secondary | ICD-10-CM | POA: Diagnosis present

## 2012-03-09 DIAGNOSIS — D689 Coagulation defect, unspecified: Secondary | ICD-10-CM | POA: Diagnosis not present

## 2012-03-09 DIAGNOSIS — N1330 Unspecified hydronephrosis: Secondary | ICD-10-CM | POA: Diagnosis not present

## 2012-03-09 DIAGNOSIS — N3289 Other specified disorders of bladder: Secondary | ICD-10-CM | POA: Diagnosis not present

## 2012-03-09 DIAGNOSIS — N390 Urinary tract infection, site not specified: Secondary | ICD-10-CM | POA: Diagnosis not present

## 2012-03-09 DIAGNOSIS — Z79899 Other long term (current) drug therapy: Secondary | ICD-10-CM | POA: Diagnosis not present

## 2012-03-09 DIAGNOSIS — T83511A Infection and inflammatory reaction due to indwelling urethral catheter, initial encounter: Secondary | ICD-10-CM | POA: Diagnosis present

## 2012-03-09 DIAGNOSIS — R339 Retention of urine, unspecified: Secondary | ICD-10-CM | POA: Diagnosis not present

## 2012-03-09 DIAGNOSIS — S301XXA Contusion of abdominal wall, initial encounter: Secondary | ICD-10-CM | POA: Diagnosis not present

## 2012-03-09 DIAGNOSIS — Z823 Family history of stroke: Secondary | ICD-10-CM | POA: Diagnosis not present

## 2012-03-09 DIAGNOSIS — R6889 Other general symptoms and signs: Secondary | ICD-10-CM | POA: Diagnosis not present

## 2012-03-09 DIAGNOSIS — T8389XA Other specified complication of genitourinary prosthetic devices, implants and grafts, initial encounter: Secondary | ICD-10-CM | POA: Diagnosis not present

## 2012-03-09 DIAGNOSIS — F068 Other specified mental disorders due to known physiological condition: Secondary | ICD-10-CM | POA: Diagnosis not present

## 2012-03-09 DIAGNOSIS — N138 Other obstructive and reflux uropathy: Secondary | ICD-10-CM | POA: Diagnosis present

## 2012-03-09 DIAGNOSIS — Z7982 Long term (current) use of aspirin: Secondary | ICD-10-CM | POA: Diagnosis not present

## 2012-03-09 DIAGNOSIS — Z9181 History of falling: Secondary | ICD-10-CM | POA: Diagnosis not present

## 2012-03-09 DIAGNOSIS — E785 Hyperlipidemia, unspecified: Secondary | ICD-10-CM | POA: Diagnosis present

## 2012-03-09 DIAGNOSIS — F0390 Unspecified dementia without behavioral disturbance: Secondary | ICD-10-CM | POA: Diagnosis not present

## 2012-03-09 DIAGNOSIS — I10 Essential (primary) hypertension: Secondary | ICD-10-CM | POA: Diagnosis not present

## 2012-03-09 DIAGNOSIS — Z87440 Personal history of urinary (tract) infections: Secondary | ICD-10-CM | POA: Diagnosis not present

## 2012-03-09 DIAGNOSIS — K219 Gastro-esophageal reflux disease without esophagitis: Secondary | ICD-10-CM | POA: Diagnosis present

## 2012-03-09 DIAGNOSIS — E119 Type 2 diabetes mellitus without complications: Secondary | ICD-10-CM | POA: Diagnosis not present

## 2012-03-09 DIAGNOSIS — Z880 Allergy status to penicillin: Secondary | ICD-10-CM | POA: Diagnosis not present

## 2012-03-09 DIAGNOSIS — R262 Difficulty in walking, not elsewhere classified: Secondary | ICD-10-CM | POA: Diagnosis not present

## 2012-03-09 DIAGNOSIS — I4891 Unspecified atrial fibrillation: Secondary | ICD-10-CM | POA: Diagnosis not present

## 2012-03-09 LAB — URINALYSIS, COMPLETE
Bilirubin,UR: NEGATIVE
Glucose,UR: NEGATIVE mg/dL (ref 0–75)
Nitrite: NEGATIVE
Specific Gravity: 1.01 (ref 1.003–1.030)
Squamous Epithelial: NONE SEEN
WBC UR: 320 /HPF (ref 0–5)

## 2012-03-09 LAB — COMPREHENSIVE METABOLIC PANEL
Alkaline Phosphatase: 65 U/L (ref 50–136)
Anion Gap: 6 — ABNORMAL LOW (ref 7–16)
Calcium, Total: 9.1 mg/dL (ref 8.5–10.1)
Co2: 26 mmol/L (ref 21–32)
Creatinine: 0.95 mg/dL (ref 0.60–1.30)
EGFR (Non-African Amer.): >60
Potassium: 4.1 mmol/L (ref 3.5–5.1)
SGOT(AST): 22 U/L (ref 15–37)
SGPT (ALT): 37 U/L

## 2012-03-09 LAB — LIPASE, BLOOD: Lipase: 148 U/L (ref 73–393)

## 2012-03-09 LAB — CBC
MCH: 33 pg (ref 26.0–34.0)
MCHC: 33.7 g/dL (ref 32.0–36.0)
Platelet: 182 10*3/uL (ref 150–440)
RBC: 4.46 10*6/uL (ref 4.40–5.90)
RDW: 14.9 % — ABNORMAL HIGH (ref 11.5–14.5)
WBC: 14 10*3/uL — ABNORMAL HIGH (ref 3.8–10.6)

## 2012-03-09 LAB — CK TOTAL AND CKMB (NOT AT ARMC)
CK, Total: 21 U/L — ABNORMAL LOW (ref 35–232)
CK-MB: <0.5 ng/mL — ABNORMAL LOW (ref 0.5–3.6)

## 2012-03-10 LAB — CBC WITH DIFFERENTIAL/PLATELET
Basophil %: 0.2 %
Eosinophil #: 0.1 10*3/uL (ref 0.0–0.7)
Eosinophil %: 0.7 %
HCT: 41.1 % (ref 40.0–52.0)
HGB: 13.9 g/dL (ref 13.0–18.0)
Lymphocyte #: 1.7 10*3/uL (ref 1.0–3.6)
Lymphocyte %: 14 %
MCV: 97 fL (ref 80–100)
Monocyte %: 8.7 %
Neutrophil #: 9.2 10*3/uL — ABNORMAL HIGH (ref 1.4–6.5)
Platelet: 184 10*3/uL (ref 150–440)
RBC: 4.23 10*6/uL — ABNORMAL LOW (ref 4.40–5.90)

## 2012-03-10 LAB — BASIC METABOLIC PANEL
Anion Gap: 12 (ref 7–16)
BUN: 16 mg/dL (ref 7–18)
Chloride: 103 mmol/L (ref 98–107)
Creatinine: 0.91 mg/dL (ref 0.60–1.30)
EGFR (African American): >60
EGFR (Non-African Amer.): >60
Osmolality: 285 (ref 275–301)

## 2012-03-10 LAB — URINALYSIS, COMPLETE
Glucose,UR: NEGATIVE mg/dL (ref 0–75)
Ketone: NEGATIVE
Nitrite: NEGATIVE
Protein: 30
RBC,UR: 146 /HPF (ref 0–5)
Squamous Epithelial: NONE SEEN
WBC UR: 314 /HPF (ref 0–5)

## 2012-03-10 LAB — DIGOXIN LEVEL: Digoxin: 1.08 ng/mL

## 2012-03-11 DIAGNOSIS — I4891 Unspecified atrial fibrillation: Secondary | ICD-10-CM | POA: Diagnosis not present

## 2012-03-11 DIAGNOSIS — R6889 Other general symptoms and signs: Secondary | ICD-10-CM | POA: Diagnosis not present

## 2012-03-11 DIAGNOSIS — R319 Hematuria, unspecified: Secondary | ICD-10-CM | POA: Diagnosis not present

## 2012-03-11 DIAGNOSIS — G3184 Mild cognitive impairment, so stated: Secondary | ICD-10-CM | POA: Diagnosis not present

## 2012-03-11 DIAGNOSIS — Z79899 Other long term (current) drug therapy: Secondary | ICD-10-CM | POA: Diagnosis not present

## 2012-03-11 DIAGNOSIS — E669 Obesity, unspecified: Secondary | ICD-10-CM | POA: Diagnosis not present

## 2012-03-11 DIAGNOSIS — Z7982 Long term (current) use of aspirin: Secondary | ICD-10-CM | POA: Diagnosis not present

## 2012-03-11 DIAGNOSIS — I714 Abdominal aortic aneurysm, without rupture: Secondary | ICD-10-CM | POA: Diagnosis not present

## 2012-03-11 DIAGNOSIS — R197 Diarrhea, unspecified: Secondary | ICD-10-CM | POA: Diagnosis not present

## 2012-03-11 DIAGNOSIS — R338 Other retention of urine: Secondary | ICD-10-CM | POA: Diagnosis not present

## 2012-03-11 DIAGNOSIS — M6281 Muscle weakness (generalized): Secondary | ICD-10-CM | POA: Diagnosis not present

## 2012-03-11 DIAGNOSIS — E785 Hyperlipidemia, unspecified: Secondary | ICD-10-CM | POA: Diagnosis not present

## 2012-03-11 DIAGNOSIS — N320 Bladder-neck obstruction: Secondary | ICD-10-CM | POA: Diagnosis not present

## 2012-03-11 DIAGNOSIS — E119 Type 2 diabetes mellitus without complications: Secondary | ICD-10-CM | POA: Diagnosis not present

## 2012-03-11 DIAGNOSIS — I472 Ventricular tachycardia: Secondary | ICD-10-CM | POA: Diagnosis not present

## 2012-03-11 DIAGNOSIS — N400 Enlarged prostate without lower urinary tract symptoms: Secondary | ICD-10-CM | POA: Diagnosis not present

## 2012-03-11 DIAGNOSIS — N390 Urinary tract infection, site not specified: Secondary | ICD-10-CM | POA: Diagnosis not present

## 2012-03-11 DIAGNOSIS — N1330 Unspecified hydronephrosis: Secondary | ICD-10-CM | POA: Diagnosis not present

## 2012-03-11 DIAGNOSIS — R262 Difficulty in walking, not elsewhere classified: Secondary | ICD-10-CM | POA: Diagnosis not present

## 2012-03-11 DIAGNOSIS — I499 Cardiac arrhythmia, unspecified: Secondary | ICD-10-CM | POA: Diagnosis not present

## 2012-03-11 DIAGNOSIS — N139 Obstructive and reflux uropathy, unspecified: Secondary | ICD-10-CM | POA: Diagnosis not present

## 2012-03-11 DIAGNOSIS — R339 Retention of urine, unspecified: Secondary | ICD-10-CM | POA: Diagnosis not present

## 2012-03-11 DIAGNOSIS — I10 Essential (primary) hypertension: Secondary | ICD-10-CM | POA: Diagnosis not present

## 2012-03-11 LAB — CBC WITH DIFFERENTIAL/PLATELET
Basophil #: 0 10*3/uL (ref 0.0–0.1)
Eosinophil %: 1.6 %
HGB: 13.2 g/dL (ref 13.0–18.0)
Lymphocyte %: 13.4 %
MCH: 32.5 pg (ref 26.0–34.0)
Monocyte %: 7.9 %
Neutrophil #: 8.3 10*3/uL — ABNORMAL HIGH (ref 1.4–6.5)
Neutrophil %: 76.8 %
Platelet: 156 10*3/uL (ref 150–440)
RBC: 4.05 10*6/uL — ABNORMAL LOW (ref 4.40–5.90)
RDW: 14.7 % — ABNORMAL HIGH (ref 11.5–14.5)
WBC: 10.8 10*3/uL — ABNORMAL HIGH (ref 3.8–10.6)

## 2012-03-11 LAB — BASIC METABOLIC PANEL
Anion Gap: 12 (ref 7–16)
BUN: 15 mg/dL (ref 7–18)
Chloride: 105 mmol/L (ref 98–107)
Creatinine: 1 mg/dL (ref 0.60–1.30)
EGFR (African American): >60
EGFR (Non-African Amer.): >60
Glucose: 233 mg/dL — ABNORMAL HIGH (ref 65–99)
Osmolality: 295 (ref 275–301)

## 2012-03-13 DIAGNOSIS — G3184 Mild cognitive impairment, so stated: Secondary | ICD-10-CM | POA: Diagnosis not present

## 2012-03-13 DIAGNOSIS — N400 Enlarged prostate without lower urinary tract symptoms: Secondary | ICD-10-CM | POA: Diagnosis not present

## 2012-03-13 DIAGNOSIS — E119 Type 2 diabetes mellitus without complications: Secondary | ICD-10-CM

## 2012-03-13 DIAGNOSIS — I4891 Unspecified atrial fibrillation: Secondary | ICD-10-CM | POA: Diagnosis not present

## 2012-03-14 LAB — URINE CULTURE

## 2012-03-15 LAB — CULTURE, BLOOD (SINGLE)

## 2012-03-18 ENCOUNTER — Other Ambulatory Visit: Payer: Self-pay

## 2012-03-24 DIAGNOSIS — I714 Abdominal aortic aneurysm, without rupture: Secondary | ICD-10-CM | POA: Diagnosis not present

## 2012-03-24 DIAGNOSIS — I10 Essential (primary) hypertension: Secondary | ICD-10-CM | POA: Diagnosis not present

## 2012-03-24 DIAGNOSIS — E669 Obesity, unspecified: Secondary | ICD-10-CM | POA: Diagnosis not present

## 2012-03-24 DIAGNOSIS — I499 Cardiac arrhythmia, unspecified: Secondary | ICD-10-CM | POA: Diagnosis not present

## 2012-03-25 DIAGNOSIS — R197 Diarrhea, unspecified: Secondary | ICD-10-CM | POA: Diagnosis not present

## 2012-04-01 DIAGNOSIS — R319 Hematuria, unspecified: Secondary | ICD-10-CM

## 2012-04-04 DIAGNOSIS — R338 Other retention of urine: Secondary | ICD-10-CM | POA: Diagnosis not present

## 2012-04-04 DIAGNOSIS — N1330 Unspecified hydronephrosis: Secondary | ICD-10-CM | POA: Diagnosis not present

## 2012-04-07 ENCOUNTER — Ambulatory Visit: Payer: Self-pay | Admitting: Urology

## 2012-04-07 DIAGNOSIS — N1330 Unspecified hydronephrosis: Secondary | ICD-10-CM | POA: Diagnosis not present

## 2012-04-09 ENCOUNTER — Emergency Department: Payer: Self-pay | Admitting: Emergency Medicine

## 2012-04-09 ENCOUNTER — Telehealth: Payer: Self-pay | Admitting: Internal Medicine

## 2012-04-09 DIAGNOSIS — R319 Hematuria, unspecified: Secondary | ICD-10-CM | POA: Diagnosis not present

## 2012-04-09 DIAGNOSIS — R339 Retention of urine, unspecified: Secondary | ICD-10-CM | POA: Diagnosis not present

## 2012-04-09 LAB — BASIC METABOLIC PANEL
Anion Gap: 10 (ref 7–16)
BUN: 10 mg/dL (ref 7–18)
Calcium, Total: 8.9 mg/dL (ref 8.5–10.1)
Co2: 27 mmol/L (ref 21–32)
EGFR (African American): >60
EGFR (Non-African Amer.): >60
Glucose: 134 mg/dL — ABNORMAL HIGH (ref 65–99)
Osmolality: 282 (ref 275–301)

## 2012-04-09 LAB — CBC
HGB: 13.7 g/dL (ref 13.0–18.0)
MCHC: 33.2 g/dL (ref 32.0–36.0)
Platelet: 160 10*3/uL (ref 150–440)
RBC: 4.39 10*6/uL — ABNORMAL LOW (ref 4.40–5.90)
WBC: 8.7 10*3/uL (ref 3.8–10.6)

## 2012-04-09 NOTE — Telephone Encounter (Signed)
Triage Record Num: [id number] Operator: Aundra Millet Patient Name: Benjamin Grant Call Date & Time: 04/09/2012 5:24:04AM Patient Phone: [phone] PCP: Tillman Abide Caller Name: Jacelyn Pi Relationship to Patient: Unknown Patient Gender: Male PCP Fax : [phone] Patient DOB: 12-10-1930 [facility] Name[facility] Reason for Call: Caller: [place]; PCP: Tillman Abide I.; CB#: [phone]; Seen at Urologist with renal scan 04/07/2012 and urinary catheter removed and I/O ordered prn . Pt has hx of urinary retention. On 04/07/2012 11p-7a shift abd distended and c/o of discomfort. Dr Marland Kitchen [place] "/ Urologist was contacted and adv to insert [facility] - pt had relief from discomfort and had 2700cc total output for shift. - Durng day shift 04/08/2012 clear urine and no known problems. Tonight having discomfort bladder area again with distention with small amt yellow urine. Per facilty orders LPN irrigated FC with NS and passed clot through penis and then had with blood tinged urine of 1000 cc. Pt has no pain now. Urine is blood tinged with tiny blood clots. T 97.1 P 108 R 20 BP 122/70 O2 sat 98 % - LPN noted that the [facility] tubing was coming out of top of brief and wonders if tubing was pulled on.Pt does take Coumadin. RN reached Call Provider DISP for recent invasive prodedure and more bleeding per Bloody Urine protocol - RN called Dr Alwyn Ren and he ordered for facilty to call pt's Urologist now and obtain PT/INR and RN gave orders to Marie/ LPN . Protocol(s) Used: Bloody Urine Recommended Outcome per Protocol: Call Provider Immediately Reason for Outcome: Recent surgery or invasive procedure (catherization, cystoscopy, ureteroscopy) and more bleeding OR bleeding lasting longer than expected as defined in provider specified discharge information Care Advice: ~ Continue to follow your provider's post-procedure instructions for home. ~ SYMPTOM / CONDITION MANAGEMENT 04/09/2012  6:19:00AM Page 1 of 1 CAN_TriageRpt_V2

## 2012-04-09 NOTE — Telephone Encounter (Signed)
Discussed with staff Was admitted to [facility] with Dr Lonna Cobb managing care---expects continuous bladder irrigation Will have to reconsider the coumadin despite appropriate indication

## 2012-04-16 ENCOUNTER — Other Ambulatory Visit: Payer: Self-pay

## 2012-04-18 DIAGNOSIS — N401 Enlarged prostate with lower urinary tract symptoms: Secondary | ICD-10-CM | POA: Diagnosis not present

## 2012-04-21 DIAGNOSIS — I4891 Unspecified atrial fibrillation: Secondary | ICD-10-CM | POA: Diagnosis not present

## 2012-04-24 DIAGNOSIS — I10 Essential (primary) hypertension: Secondary | ICD-10-CM | POA: Diagnosis not present

## 2012-04-24 DIAGNOSIS — G3184 Mild cognitive impairment, so stated: Secondary | ICD-10-CM | POA: Diagnosis not present

## 2012-04-24 DIAGNOSIS — N401 Enlarged prostate with lower urinary tract symptoms: Secondary | ICD-10-CM

## 2012-04-24 DIAGNOSIS — E785 Hyperlipidemia, unspecified: Secondary | ICD-10-CM | POA: Diagnosis not present

## 2012-04-24 DIAGNOSIS — E119 Type 2 diabetes mellitus without complications: Secondary | ICD-10-CM

## 2012-04-24 DIAGNOSIS — I4891 Unspecified atrial fibrillation: Secondary | ICD-10-CM

## 2012-05-10 ENCOUNTER — Emergency Department: Payer: Self-pay | Admitting: Emergency Medicine

## 2012-05-10 DIAGNOSIS — E119 Type 2 diabetes mellitus without complications: Secondary | ICD-10-CM | POA: Diagnosis not present

## 2012-05-10 DIAGNOSIS — I10 Essential (primary) hypertension: Secondary | ICD-10-CM | POA: Diagnosis not present

## 2012-05-10 DIAGNOSIS — R6889 Other general symptoms and signs: Secondary | ICD-10-CM | POA: Diagnosis not present

## 2012-05-10 DIAGNOSIS — T83091A Other mechanical complication of indwelling urethral catheter, initial encounter: Secondary | ICD-10-CM | POA: Diagnosis not present

## 2012-05-10 DIAGNOSIS — R339 Retention of urine, unspecified: Secondary | ICD-10-CM | POA: Diagnosis not present

## 2012-05-10 LAB — URINALYSIS, COMPLETE
Glucose,UR: NEGATIVE mg/dL (ref 0–75)
Ketone: NEGATIVE
Specific Gravity: 1.011 (ref 1.003–1.030)

## 2012-05-13 ENCOUNTER — Telehealth: Payer: Self-pay | Admitting: Internal Medicine

## 2012-05-13 NOTE — Telephone Encounter (Signed)
Catheter replaced in ER and sent back to facility

## 2012-05-13 NOTE — Telephone Encounter (Signed)
Triage Record Num: [id number] Operator: Kelle Darting Patient Name: Benjamin Grant Call Date & Time: 05/10/2012 7:25:49PM Patient Phone: [phone] PCP: Tillman Abide Patient Gender: Male PCP Fax : [phone] Patient DOB: 07-03-1930 [facility] Name[facility] Reason for Call: Caller: Cathy/LPN; PCP: Tillman Abide I.; CB#: [phone]; Call regarding Being sent out for bleeding from penis, distended abd.; Onset: 05/10/12 at 1900; Sx notes: Pt. had foley cath. but was not draining much today, abd. was very distended and had a lot of pain when irrigating foley, foley cath was removed and new one inserted, pain with insertion, had blood coming from the penis but no urine drainage; VS: Temp. 99.9;HR: 100;RR: 20;SaO2: 98% RA;B/P: 150/90; Code Status: DNR; Staff has already called 911 for pt. transportation to ED for eval.; Also calling family regarding same. Protocol(s) Used: Office Note Recommended Outcome per Protocol: Information Noted and Sent to Office Reason for Outcome: Caller information to office Care Advice: ~ 05/10/2012 7:36:47PM Page 1 of 1 CAN_TriageRpt_V2

## 2012-05-18 DIAGNOSIS — R6889 Other general symptoms and signs: Secondary | ICD-10-CM | POA: Diagnosis not present

## 2012-05-19 ENCOUNTER — Emergency Department: Payer: Self-pay | Admitting: *Deleted

## 2012-05-19 DIAGNOSIS — Z7982 Long term (current) use of aspirin: Secondary | ICD-10-CM | POA: Diagnosis not present

## 2012-05-19 DIAGNOSIS — E119 Type 2 diabetes mellitus without complications: Secondary | ICD-10-CM | POA: Diagnosis not present

## 2012-05-19 DIAGNOSIS — R6889 Other general symptoms and signs: Secondary | ICD-10-CM | POA: Diagnosis not present

## 2012-05-19 DIAGNOSIS — I10 Essential (primary) hypertension: Secondary | ICD-10-CM | POA: Diagnosis not present

## 2012-05-19 DIAGNOSIS — R339 Retention of urine, unspecified: Secondary | ICD-10-CM | POA: Diagnosis not present

## 2012-05-19 DIAGNOSIS — N390 Urinary tract infection, site not specified: Secondary | ICD-10-CM | POA: Diagnosis not present

## 2012-05-19 DIAGNOSIS — Z79899 Other long term (current) drug therapy: Secondary | ICD-10-CM | POA: Diagnosis not present

## 2012-05-19 DIAGNOSIS — T83091A Other mechanical complication of indwelling urethral catheter, initial encounter: Secondary | ICD-10-CM | POA: Diagnosis not present

## 2012-05-19 LAB — URINALYSIS, COMPLETE

## 2012-05-20 ENCOUNTER — Telehealth: Payer: Self-pay | Admitting: Internal Medicine

## 2012-05-20 DIAGNOSIS — N401 Enlarged prostate with lower urinary tract symptoms: Secondary | ICD-10-CM | POA: Diagnosis not present

## 2012-05-20 DIAGNOSIS — R338 Other retention of urine: Secondary | ICD-10-CM | POA: Diagnosis not present

## 2012-05-20 NOTE — Telephone Encounter (Signed)
Will need to speak to Dr Lonna Cobb about ongoing catheter issues

## 2012-05-20 NOTE — Telephone Encounter (Signed)
Triage Record Num: [id number] Operator: Edgar Frisk Patient Name: Benjamin Grant Call Date & Time: 05/19/2012 12:22:35AM Patient Phone: [phone] PCP: Tillman Abide Patient Gender: Male PCP Fax : [phone] Patient DOB: June 30, 1930 [facility] Name[facility] Reason for Call: Caller: Cathy/LPN; PCP: Tillman Abide I.; CB#: [phone]; Call regarding Cannot reinsert catheter, abdomen distended, calling 911 Calling for orders has already sent pt out 911 for abd distention, f/u will not flush. Orders given Protocol(s) Used: Office Note Recommended Outcome per Protocol: Information Noted and Sent to Office Reason for Outcome: Caller information to office Care Advice: ~ 05/19/2012 12:29:30AM Page 1 of 1 CAN_TriageRpt_V2

## 2012-05-22 LAB — URINE CULTURE

## 2012-05-30 ENCOUNTER — Ambulatory Visit: Payer: Medicare Other | Admitting: Internal Medicine

## 2012-06-04 ENCOUNTER — Ambulatory Visit: Payer: Self-pay | Admitting: Urology

## 2012-06-04 DIAGNOSIS — Z96659 Presence of unspecified artificial knee joint: Secondary | ICD-10-CM | POA: Diagnosis not present

## 2012-06-04 DIAGNOSIS — N400 Enlarged prostate without lower urinary tract symptoms: Secondary | ICD-10-CM | POA: Diagnosis not present

## 2012-06-04 DIAGNOSIS — Z87891 Personal history of nicotine dependence: Secondary | ICD-10-CM | POA: Diagnosis not present

## 2012-06-04 DIAGNOSIS — Z01810 Encounter for preprocedural cardiovascular examination: Secondary | ICD-10-CM | POA: Diagnosis not present

## 2012-06-04 DIAGNOSIS — I4891 Unspecified atrial fibrillation: Secondary | ICD-10-CM | POA: Diagnosis not present

## 2012-06-04 DIAGNOSIS — E119 Type 2 diabetes mellitus without complications: Secondary | ICD-10-CM | POA: Diagnosis not present

## 2012-06-04 DIAGNOSIS — Z7982 Long term (current) use of aspirin: Secondary | ICD-10-CM | POA: Diagnosis not present

## 2012-06-04 DIAGNOSIS — R339 Retention of urine, unspecified: Secondary | ICD-10-CM | POA: Diagnosis not present

## 2012-06-04 DIAGNOSIS — N419 Inflammatory disease of prostate, unspecified: Secondary | ICD-10-CM | POA: Diagnosis not present

## 2012-06-04 DIAGNOSIS — R609 Edema, unspecified: Secondary | ICD-10-CM | POA: Diagnosis not present

## 2012-06-04 DIAGNOSIS — Z01812 Encounter for preprocedural laboratory examination: Secondary | ICD-10-CM | POA: Diagnosis not present

## 2012-06-04 DIAGNOSIS — G4730 Sleep apnea, unspecified: Secondary | ICD-10-CM | POA: Diagnosis not present

## 2012-06-04 DIAGNOSIS — Z79899 Other long term (current) drug therapy: Secondary | ICD-10-CM | POA: Diagnosis not present

## 2012-06-04 LAB — CBC WITH DIFFERENTIAL/PLATELET
Basophil #: 0 10*3/uL (ref 0.0–0.1)
Eosinophil #: 0.3 10*3/uL (ref 0.0–0.7)
Eosinophil %: 2.6 %
HGB: 13.7 g/dL (ref 13.0–18.0)
Lymphocyte #: 2.4 10*3/uL (ref 1.0–3.6)
MCH: 31 pg (ref 26.0–34.0)
MCHC: 33.3 g/dL (ref 32.0–36.0)
MCV: 93 fL (ref 80–100)
Monocyte #: 1 x10 3/mm (ref 0.2–1.0)
Monocyte %: 9.4 %
Neutrophil %: 64.5 %
RBC: 4.41 10*6/uL (ref 4.40–5.90)
RDW: 15.9 % — ABNORMAL HIGH (ref 11.5–14.5)
WBC: 10.5 10*3/uL (ref 3.8–10.6)

## 2012-06-04 LAB — BASIC METABOLIC PANEL
BUN: 14 mg/dL (ref 7–18)
Calcium, Total: 9.5 mg/dL (ref 8.5–10.1)
Chloride: 104 mmol/L (ref 98–107)
Co2: 27 mmol/L (ref 21–32)
EGFR (African American): >60
Glucose: 159 mg/dL — ABNORMAL HIGH (ref 65–99)
Osmolality: 283 (ref 275–301)

## 2012-06-04 LAB — APTT: Activated PTT: 28.7 secs (ref 23.6–35.9)

## 2012-06-04 LAB — PROTIME-INR
INR: 0.9
Prothrombin Time: 12.6 secs (ref 11.5–14.7)

## 2012-06-05 DIAGNOSIS — N390 Urinary tract infection, site not specified: Secondary | ICD-10-CM | POA: Diagnosis not present

## 2012-06-06 ENCOUNTER — Ambulatory Visit: Payer: Self-pay | Admitting: Internal Medicine

## 2012-06-06 DIAGNOSIS — R059 Cough, unspecified: Secondary | ICD-10-CM | POA: Diagnosis not present

## 2012-06-06 DIAGNOSIS — Z01818 Encounter for other preprocedural examination: Secondary | ICD-10-CM | POA: Diagnosis not present

## 2012-06-10 DIAGNOSIS — N139 Obstructive and reflux uropathy, unspecified: Secondary | ICD-10-CM | POA: Diagnosis not present

## 2012-06-10 DIAGNOSIS — E119 Type 2 diabetes mellitus without complications: Secondary | ICD-10-CM

## 2012-06-10 DIAGNOSIS — I4891 Unspecified atrial fibrillation: Secondary | ICD-10-CM | POA: Diagnosis not present

## 2012-06-10 DIAGNOSIS — G3184 Mild cognitive impairment, so stated: Secondary | ICD-10-CM | POA: Diagnosis not present

## 2012-06-11 ENCOUNTER — Ambulatory Visit: Payer: Self-pay | Admitting: Urology

## 2012-06-11 DIAGNOSIS — Z79899 Other long term (current) drug therapy: Secondary | ICD-10-CM | POA: Diagnosis not present

## 2012-06-11 DIAGNOSIS — G4730 Sleep apnea, unspecified: Secondary | ICD-10-CM | POA: Diagnosis not present

## 2012-06-11 DIAGNOSIS — E119 Type 2 diabetes mellitus without complications: Secondary | ICD-10-CM | POA: Diagnosis not present

## 2012-06-11 DIAGNOSIS — R338 Other retention of urine: Secondary | ICD-10-CM | POA: Diagnosis not present

## 2012-06-11 DIAGNOSIS — R609 Edema, unspecified: Secondary | ICD-10-CM | POA: Diagnosis not present

## 2012-06-11 DIAGNOSIS — I4891 Unspecified atrial fibrillation: Secondary | ICD-10-CM | POA: Diagnosis not present

## 2012-06-11 DIAGNOSIS — N400 Enlarged prostate without lower urinary tract symptoms: Secondary | ICD-10-CM | POA: Diagnosis not present

## 2012-06-11 DIAGNOSIS — N419 Inflammatory disease of prostate, unspecified: Secondary | ICD-10-CM | POA: Diagnosis not present

## 2012-06-11 DIAGNOSIS — R339 Retention of urine, unspecified: Secondary | ICD-10-CM | POA: Diagnosis not present

## 2012-06-11 DIAGNOSIS — Z96659 Presence of unspecified artificial knee joint: Secondary | ICD-10-CM | POA: Diagnosis not present

## 2012-06-11 DIAGNOSIS — Z7982 Long term (current) use of aspirin: Secondary | ICD-10-CM | POA: Diagnosis not present

## 2012-06-11 DIAGNOSIS — Z87891 Personal history of nicotine dependence: Secondary | ICD-10-CM | POA: Diagnosis not present

## 2012-06-11 DIAGNOSIS — N401 Enlarged prostate with lower urinary tract symptoms: Secondary | ICD-10-CM | POA: Diagnosis not present

## 2012-06-11 DIAGNOSIS — N138 Other obstructive and reflux uropathy: Secondary | ICD-10-CM | POA: Diagnosis not present

## 2012-06-23 ENCOUNTER — Telehealth: Payer: Self-pay | Admitting: Internal Medicine

## 2012-06-23 DIAGNOSIS — H109 Unspecified conjunctivitis: Secondary | ICD-10-CM

## 2012-06-23 NOTE — Telephone Encounter (Signed)
I saw him today at [facility] and his is improved

## 2012-06-23 NOTE — Telephone Encounter (Signed)
Triage Record Num: [id number] Operator: Tarri Glenn Patient Name: Benjamin Grant Call Date & Time: 06/21/2012 5:30:51PM Patient Phone: [phone] PCP: Tillman Abide Patient Gender: Male PCP Fax : [phone] Patient DOB: 14-Jul-1930 [facility] Name[facility] Reason for Call: Caller: Lamonte Richer; PCP: Tillman Abide; CB#: [phone]; Call regarding Eye redness possible pink eye; Jasmine December, RN, from [facility] is calling about right eye redness. Patient also reports vision is blurry from right eye. Onset 06/21/12. Patient has mucousy colored discharge from right eye. Afebrile. All emergent s/s r/o with exception to sudden loss or change in vision and not previously evaluated per Eye: Pain or Vision Change protocol. Orders given to start Polytrim eye drops 2 drops both eyes QID x5 days per standing orders. Protocol(s) Used: Eye: Infection or Irritation Protocol(s) Used: Eye: Pain or Vision Change Recommended Outcome per Protocol: See ED Immediately Override Outcome if Used in Protocol: Provide Home/Self Care RN Reason for Override Outcome: Rx Standing Orders Used. Reason for Outcome: Sudden decrease in visual acuity Sudden loss or change in vision (double or blurred vision, increased light sensitivity, partial loss of visual field) AND not previously evaluated Care Advice: ~ 06/21/2012 5:42:49PM Page 1 of 1 CAN_TriageRpt_V2

## 2012-06-30 DIAGNOSIS — N390 Urinary tract infection, site not specified: Secondary | ICD-10-CM | POA: Diagnosis not present

## 2012-06-30 DIAGNOSIS — N401 Enlarged prostate with lower urinary tract symptoms: Secondary | ICD-10-CM | POA: Diagnosis not present

## 2012-06-30 DIAGNOSIS — R338 Other retention of urine: Secondary | ICD-10-CM | POA: Diagnosis not present

## 2012-07-13 ENCOUNTER — Emergency Department: Payer: Self-pay | Admitting: Emergency Medicine

## 2012-07-13 DIAGNOSIS — K7689 Other specified diseases of liver: Secondary | ICD-10-CM | POA: Diagnosis not present

## 2012-07-13 DIAGNOSIS — R3916 Straining to void: Secondary | ICD-10-CM | POA: Diagnosis not present

## 2012-07-13 DIAGNOSIS — K625 Hemorrhage of anus and rectum: Secondary | ICD-10-CM | POA: Diagnosis not present

## 2012-07-13 DIAGNOSIS — E119 Type 2 diabetes mellitus without complications: Secondary | ICD-10-CM | POA: Diagnosis not present

## 2012-07-13 DIAGNOSIS — R6889 Other general symptoms and signs: Secondary | ICD-10-CM | POA: Diagnosis not present

## 2012-07-13 DIAGNOSIS — N390 Urinary tract infection, site not specified: Secondary | ICD-10-CM | POA: Diagnosis not present

## 2012-07-13 DIAGNOSIS — K5730 Diverticulosis of large intestine without perforation or abscess without bleeding: Secondary | ICD-10-CM | POA: Diagnosis not present

## 2012-07-13 LAB — CBC
HCT: 41 % (ref 40.0–52.0)
HGB: 13.5 g/dL (ref 13.0–18.0)
MCHC: 32.8 g/dL (ref 32.0–36.0)
MCV: 95 fL (ref 80–100)
RBC: 4.31 10*6/uL — ABNORMAL LOW (ref 4.40–5.90)
RDW: 15.4 % — ABNORMAL HIGH (ref 11.5–14.5)

## 2012-07-13 LAB — URINALYSIS, COMPLETE
Bilirubin,UR: NEGATIVE
Glucose,UR: >=500 mg/dL (ref 0–75)
Ketone: NEGATIVE
Nitrite: NEGATIVE
Protein: 30
Specific Gravity: 1.022 (ref 1.003–1.030)
Squamous Epithelial: NONE SEEN
WBC UR: 492 /HPF (ref 0–5)

## 2012-07-13 LAB — COMPREHENSIVE METABOLIC PANEL
Albumin: 3.2 g/dL — ABNORMAL LOW (ref 3.4–5.0)
Alkaline Phosphatase: 76 U/L (ref 50–136)
Anion Gap: 12 (ref 7–16)
BUN: 20 mg/dL — ABNORMAL HIGH (ref 7–18)
Calcium, Total: 8.9 mg/dL (ref 8.5–10.1)
Creatinine: 1.14 mg/dL (ref 0.60–1.30)
EGFR (African American): >60
Glucose: 375 mg/dL — ABNORMAL HIGH (ref 65–99)
Osmolality: 292 (ref 275–301)
Potassium: 4.5 mmol/L (ref 3.5–5.1)
Sodium: 137 mmol/L (ref 136–145)

## 2012-07-13 LAB — PROTIME-INR
INR: 0.9
Prothrombin Time: 12.5 secs (ref 11.5–14.7)

## 2012-07-14 ENCOUNTER — Telehealth: Payer: Self-pay | Admitting: Internal Medicine

## 2012-07-14 DIAGNOSIS — K5732 Diverticulitis of large intestine without perforation or abscess without bleeding: Secondary | ICD-10-CM

## 2012-07-14 DIAGNOSIS — R6889 Other general symptoms and signs: Secondary | ICD-10-CM | POA: Diagnosis not present

## 2012-07-14 DIAGNOSIS — K7689 Other specified diseases of liver: Secondary | ICD-10-CM | POA: Diagnosis not present

## 2012-07-14 NOTE — Telephone Encounter (Signed)
Triage Record Num: [id number] Operator: Karenann Cai Patient Name: Benjamin Grant Call Date & Time: 07/13/2012 7:02:06PM Patient Phone: [phone] PCP: Tillman Abide Patient Gender: Male PCP Fax : [phone] Patient DOB: 1930/08/26 Practice Name[facility] Reason for Call: Caller: Sharon/RN; PCP: Tillman Abide; CB#: [phone]; call regarding arge bloody stool, no abdominal pain and history of diverticulosis. Afebrile. Facility RN reports that "large volume of blood in stool and in commode, no clots". RN advised that patient be evaluated within the next 4 hours: ER visit. Protocol(s) Used: Diarrhea or Other Change in Bowel Habits Protocol(s) Used: Gastrointestinal Bleeding Recommended Outcome per Protocol: See Provider within 4 hours Reason for Outcome: Rectal bleeding (blood in or on the stool, more than scant; black tarry stools) One or more episodes of rectal bleeding (more than scant) and no symptoms of hypovolemia Care Advice: ~ 07/13/2012 7:12:37PM Page 1 of 1 CAN_TriageRpt_V2

## 2012-07-14 NOTE — Telephone Encounter (Signed)
Please call and get update on patient.  Then forward to Dr. Alphonsus Sias after notifying me of the update as a FYI.

## 2012-07-15 NOTE — Telephone Encounter (Signed)
Noted  

## 2012-07-15 NOTE — Telephone Encounter (Signed)
Dr. Dayton Martes looked at patient after ER visit, pt is doing good, went to ER diagnosed with diverticulitis and on 2 abx per [facility] from the 3rd floor.

## 2012-07-24 DIAGNOSIS — N400 Enlarged prostate without lower urinary tract symptoms: Secondary | ICD-10-CM

## 2012-07-24 DIAGNOSIS — G3184 Mild cognitive impairment, so stated: Secondary | ICD-10-CM

## 2012-07-24 DIAGNOSIS — I4891 Unspecified atrial fibrillation: Secondary | ICD-10-CM

## 2012-07-24 DIAGNOSIS — E119 Type 2 diabetes mellitus without complications: Secondary | ICD-10-CM

## 2012-09-12 DIAGNOSIS — N390 Urinary tract infection, site not specified: Secondary | ICD-10-CM | POA: Diagnosis not present

## 2012-09-12 DIAGNOSIS — R350 Frequency of micturition: Secondary | ICD-10-CM | POA: Diagnosis not present

## 2012-09-12 DIAGNOSIS — N529 Male erectile dysfunction, unspecified: Secondary | ICD-10-CM | POA: Diagnosis not present

## 2012-09-12 DIAGNOSIS — R972 Elevated prostate specific antigen [PSA]: Secondary | ICD-10-CM | POA: Diagnosis not present

## 2012-09-12 DIAGNOSIS — N401 Enlarged prostate with lower urinary tract symptoms: Secondary | ICD-10-CM | POA: Diagnosis not present

## 2012-09-12 DIAGNOSIS — N3941 Urge incontinence: Secondary | ICD-10-CM | POA: Diagnosis not present

## 2012-09-23 DIAGNOSIS — I4891 Unspecified atrial fibrillation: Secondary | ICD-10-CM | POA: Diagnosis not present

## 2012-09-23 DIAGNOSIS — E119 Type 2 diabetes mellitus without complications: Secondary | ICD-10-CM

## 2012-09-23 DIAGNOSIS — N400 Enlarged prostate without lower urinary tract symptoms: Secondary | ICD-10-CM

## 2012-09-23 DIAGNOSIS — G3184 Mild cognitive impairment, so stated: Secondary | ICD-10-CM

## 2012-10-06 DIAGNOSIS — N3941 Urge incontinence: Secondary | ICD-10-CM | POA: Diagnosis not present

## 2012-10-06 DIAGNOSIS — N3020 Other chronic cystitis without hematuria: Secondary | ICD-10-CM | POA: Diagnosis not present

## 2012-10-06 DIAGNOSIS — R350 Frequency of micturition: Secondary | ICD-10-CM | POA: Diagnosis not present

## 2012-10-06 DIAGNOSIS — N401 Enlarged prostate with lower urinary tract symptoms: Secondary | ICD-10-CM | POA: Diagnosis not present

## 2012-10-20 DIAGNOSIS — R209 Unspecified disturbances of skin sensation: Secondary | ICD-10-CM | POA: Diagnosis not present

## 2012-10-23 DIAGNOSIS — D649 Anemia, unspecified: Secondary | ICD-10-CM | POA: Diagnosis not present

## 2012-10-23 DIAGNOSIS — E890 Postprocedural hypothyroidism: Secondary | ICD-10-CM | POA: Diagnosis not present

## 2012-10-23 DIAGNOSIS — D510 Vitamin B12 deficiency anemia due to intrinsic factor deficiency: Secondary | ICD-10-CM | POA: Diagnosis not present

## 2012-10-27 DIAGNOSIS — E785 Hyperlipidemia, unspecified: Secondary | ICD-10-CM | POA: Diagnosis not present

## 2012-10-27 DIAGNOSIS — I10 Essential (primary) hypertension: Secondary | ICD-10-CM | POA: Diagnosis not present

## 2012-10-27 DIAGNOSIS — E119 Type 2 diabetes mellitus without complications: Secondary | ICD-10-CM | POA: Diagnosis not present

## 2012-10-28 DIAGNOSIS — E1149 Type 2 diabetes mellitus with other diabetic neurological complication: Secondary | ICD-10-CM

## 2012-12-04 DIAGNOSIS — F068 Other specified mental disorders due to known physiological condition: Secondary | ICD-10-CM

## 2012-12-04 DIAGNOSIS — N400 Enlarged prostate without lower urinary tract symptoms: Secondary | ICD-10-CM

## 2012-12-04 DIAGNOSIS — I4891 Unspecified atrial fibrillation: Secondary | ICD-10-CM | POA: Diagnosis not present

## 2012-12-04 DIAGNOSIS — E119 Type 2 diabetes mellitus without complications: Secondary | ICD-10-CM

## 2012-12-09 IMAGING — CT CT ABD-PELV W/ CM
1 of 4 series · 11 of 32 positions shown, 17 images · IV contrast (isovue)
Comparison: None

REASON FOR EXAM: (1) abd pain; (2) pel pain
COMMENTS:

PROCEDURE:     CT  - CT ABDOMEN / PELVIS  W  - March 09, 2012  [DATE]
RESULT:     History: Abdominal pain
TECHNIQUE: Multiple axial images of the abdomen and pelvis were performed
from the lung bases to the pubic symphysis, with p.o. contrast and with 100
ml of Isovue 300 intravenous contrast.

[Series 2: 3mm soft tissue · axial · 0.74mm/px · z∈[-568,-168]mm · 11 of 161 slices shown, 17 images]
[im 14/161  soft-tissue]
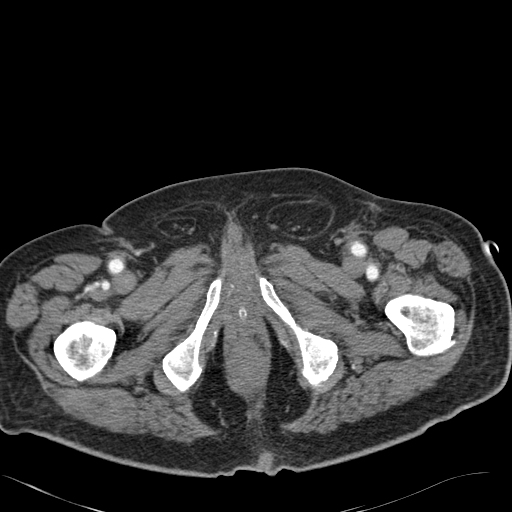
[im 14/161  bone]
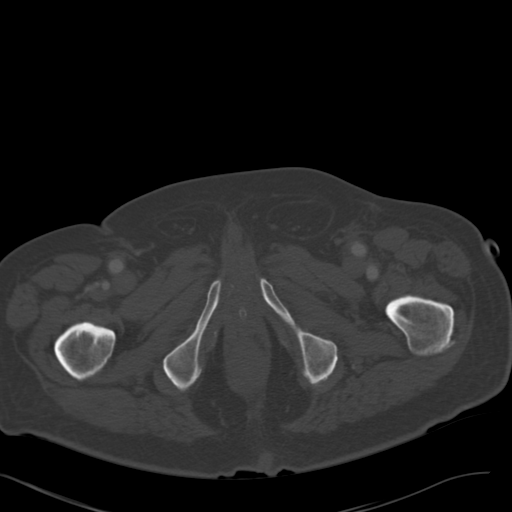
[im 27/161  soft-tissue]
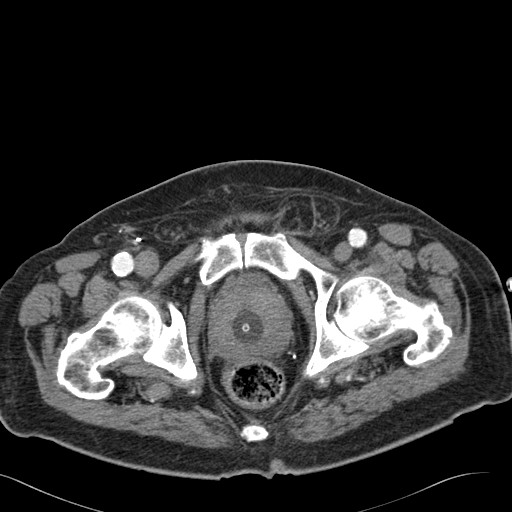
[im 41/161  soft-tissue]
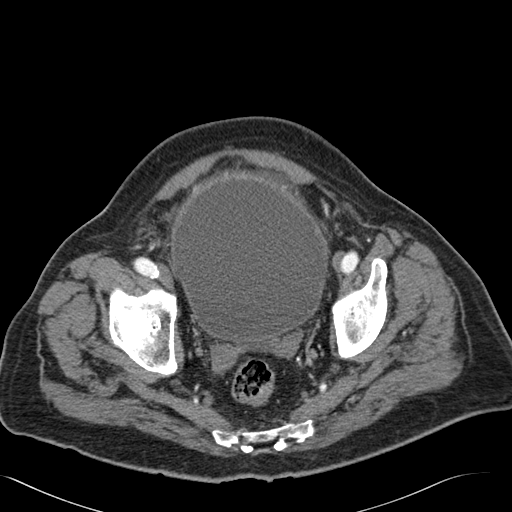
[im 54/161  soft-tissue]
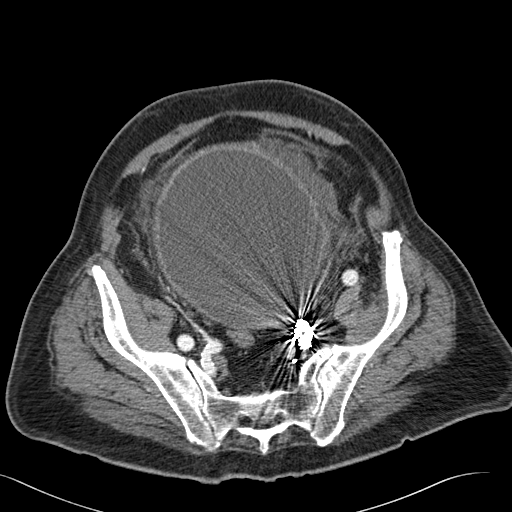
[im 67/161  soft-tissue]
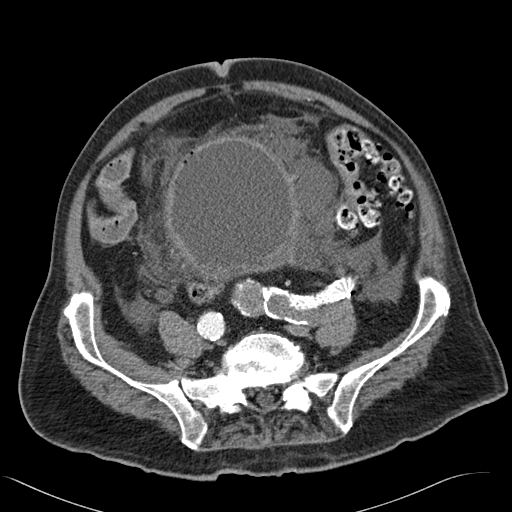
[im 81/161  soft-tissue]
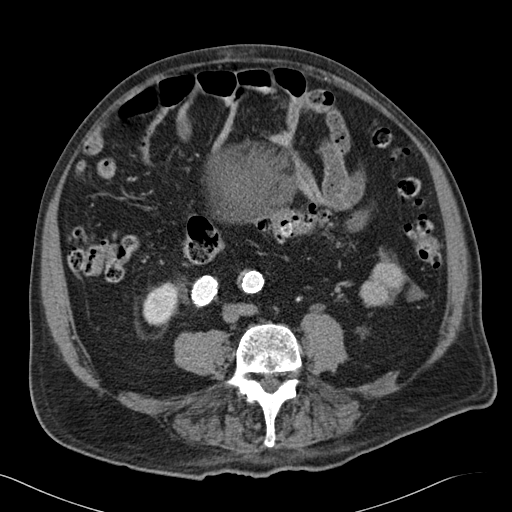
[im 94/161  soft-tissue]
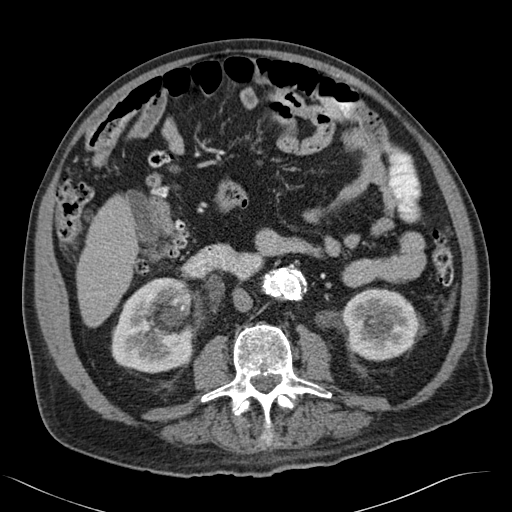
[im 107/161  soft-tissue]
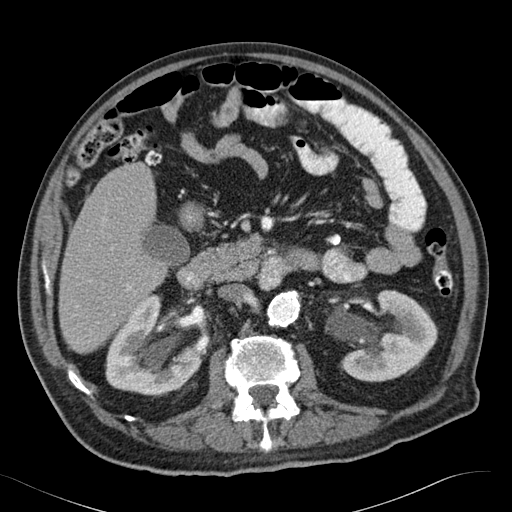
[im 107/161  lung]
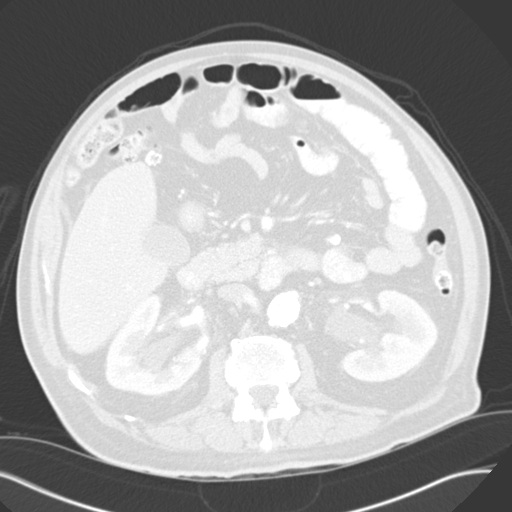
[im 121/161  soft-tissue]
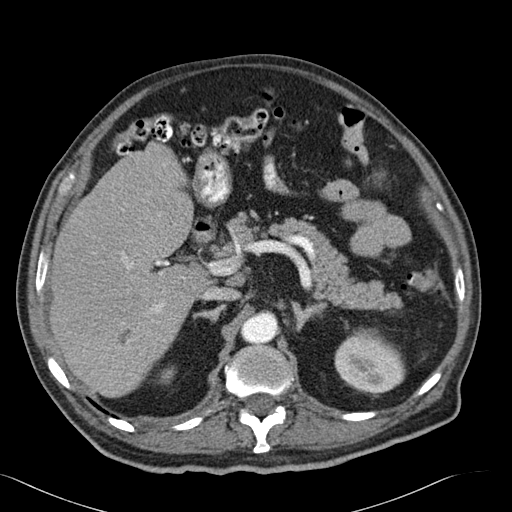
[im 121/161  lung]
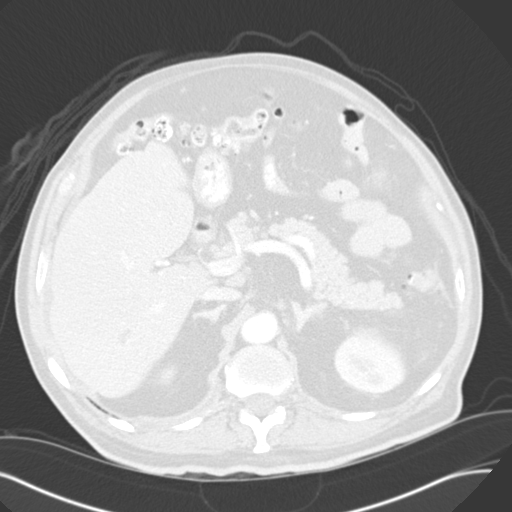
[im 121/161  bone]
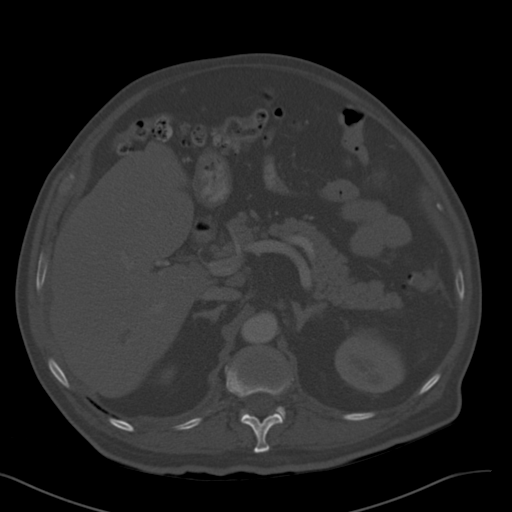
[im 134/161  soft-tissue]
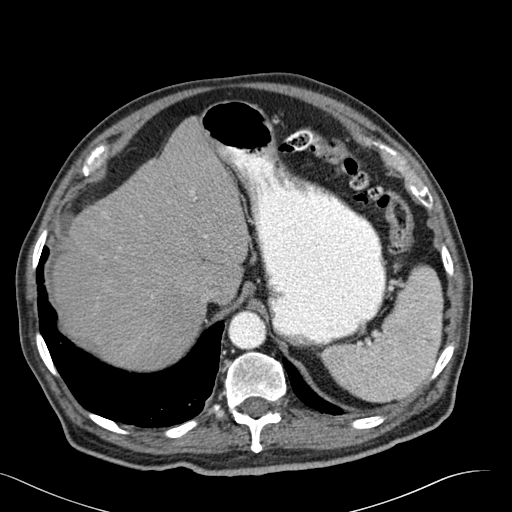
[im 134/161  lung]
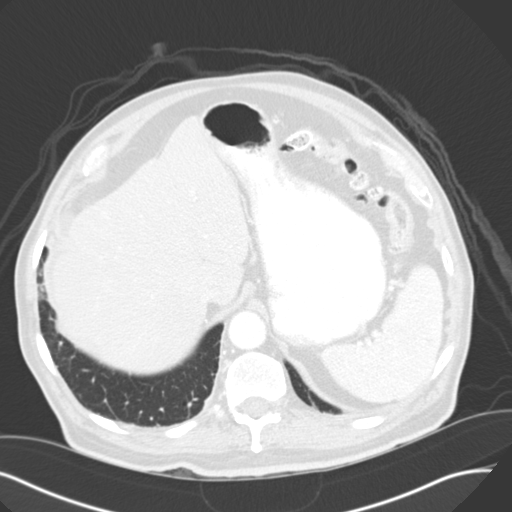
[im 147/161  soft-tissue]
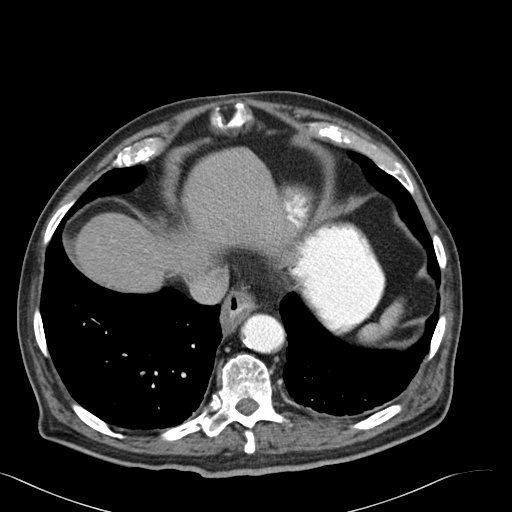
[im 147/161  lung]
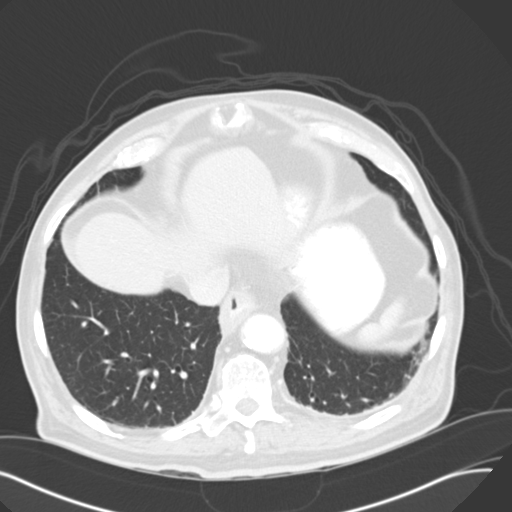

[11 of 32 positions shown; findings below may reference images not displayed]

FINDINGS: The lung bases are clear. There is no pneumothorax. The heart size is
normal.

The liver demonstrates no focal abnormality. There is no intrahepatic or
extrahepatic biliary ductal dilatation. The gallbladder is unremarkable. The
spleen demonstrates no focal abnormality. The adrenal glands and pancreas
are normal.

The bladder is severely distended. There are a few locules of air which
appear to be within versus outside the bladder wall concerning for bladder
wall injury. There is a bladder wall defect along the dome of the bladder
anteriorly most concerning for perforation. There is a malpositioned Foley
catheter present with the balloon inflated within the prostatic urethra.
There is moderate bilateral hydronephrosis.

The stomach, duodenum, small intestine, and large intestine demonstrate no
contrast extravasation or dilatation. There is diverticulosis without
evidence of diverticulitis. There is no pneumoperitoneum, pneumatosis, or
portal venous gas. There is no abdominal or pelvic free fluid. There is no
lymphadenopathy.

There is an aortobiiliac stent graft noted.

The osseous structures are unremarkable.
IMPRESSION: 1. The bladder is severely distended. There are a few locules of air which
appear to be within versus outside the bladder wall concerning for bladder
wall injury. There is a bladder wall defect along the dome of the bladder
anteriorly most concerning for perforation.  Recommend urology consultation.

There is a malpositioned Foley catheter present with the balloon inflated
within the prostatic urethra.

2. Moderate bilateral hydronephrosis likely secondary to severe bladder
distention versus distal stricture.

## 2013-01-13 ENCOUNTER — Telehealth: Payer: Self-pay | Admitting: *Deleted

## 2013-01-13 NOTE — Telephone Encounter (Signed)
Pt's step son in law states that pt's CPAP machine is very old and needs to be replaced.  He spoke with [facility] and they told him that he needs script for CPAP, documentation stating that he needs it, and documentation stating that he has a machine, but it's not working.  I advised him that pt may need to see a pulmonologist for this, but that I would send the note to you.  Please advise.

## 2013-01-14 NOTE — Telephone Encounter (Signed)
Left message for step son in-law with results, advised him to call if any questions.

## 2013-01-14 NOTE — Telephone Encounter (Signed)
Please let him know that Crystal the social worker at [facility] is working on this. I can do any certification that is needed but sometimes the durable medical equipment certification is complicated.

## 2013-03-09 DIAGNOSIS — F068 Other specified mental disorders due to known physiological condition: Secondary | ICD-10-CM

## 2013-03-09 DIAGNOSIS — N400 Enlarged prostate without lower urinary tract symptoms: Secondary | ICD-10-CM

## 2013-03-09 DIAGNOSIS — E1149 Type 2 diabetes mellitus with other diabetic neurological complication: Secondary | ICD-10-CM | POA: Diagnosis not present

## 2013-03-09 DIAGNOSIS — I4891 Unspecified atrial fibrillation: Secondary | ICD-10-CM | POA: Diagnosis not present

## 2013-03-26 DIAGNOSIS — I10 Essential (primary) hypertension: Secondary | ICD-10-CM | POA: Diagnosis not present

## 2013-03-26 DIAGNOSIS — M5430 Sciatica, unspecified side: Secondary | ICD-10-CM | POA: Diagnosis not present

## 2013-03-26 DIAGNOSIS — I714 Abdominal aortic aneurysm, without rupture: Secondary | ICD-10-CM | POA: Diagnosis not present

## 2013-03-26 DIAGNOSIS — M7989 Other specified soft tissue disorders: Secondary | ICD-10-CM | POA: Diagnosis not present

## 2013-05-07 DIAGNOSIS — D649 Anemia, unspecified: Secondary | ICD-10-CM | POA: Diagnosis not present

## 2013-05-07 DIAGNOSIS — Z79899 Other long term (current) drug therapy: Secondary | ICD-10-CM | POA: Diagnosis not present

## 2013-05-07 DIAGNOSIS — I10 Essential (primary) hypertension: Secondary | ICD-10-CM | POA: Diagnosis not present

## 2013-05-07 DIAGNOSIS — E119 Type 2 diabetes mellitus without complications: Secondary | ICD-10-CM | POA: Diagnosis not present

## 2013-05-07 DIAGNOSIS — E785 Hyperlipidemia, unspecified: Secondary | ICD-10-CM | POA: Diagnosis not present

## 2013-05-11 DIAGNOSIS — I4891 Unspecified atrial fibrillation: Secondary | ICD-10-CM

## 2013-05-11 DIAGNOSIS — E1149 Type 2 diabetes mellitus with other diabetic neurological complication: Secondary | ICD-10-CM | POA: Diagnosis not present

## 2013-05-11 DIAGNOSIS — N400 Enlarged prostate without lower urinary tract symptoms: Secondary | ICD-10-CM

## 2013-05-11 DIAGNOSIS — F068 Other specified mental disorders due to known physiological condition: Secondary | ICD-10-CM

## 2013-05-13 DIAGNOSIS — F329 Major depressive disorder, single episode, unspecified: Secondary | ICD-10-CM

## 2013-07-14 DIAGNOSIS — H2510 Age-related nuclear cataract, unspecified eye: Secondary | ICD-10-CM | POA: Diagnosis not present

## 2013-07-20 DIAGNOSIS — N400 Enlarged prostate without lower urinary tract symptoms: Secondary | ICD-10-CM

## 2013-07-20 DIAGNOSIS — E1149 Type 2 diabetes mellitus with other diabetic neurological complication: Secondary | ICD-10-CM

## 2013-07-20 DIAGNOSIS — I4891 Unspecified atrial fibrillation: Secondary | ICD-10-CM

## 2013-07-20 DIAGNOSIS — F068 Other specified mental disorders due to known physiological condition: Secondary | ICD-10-CM

## 2013-09-03 DIAGNOSIS — J209 Acute bronchitis, unspecified: Secondary | ICD-10-CM | POA: Diagnosis not present

## 2013-09-29 DIAGNOSIS — L570 Actinic keratosis: Secondary | ICD-10-CM | POA: Diagnosis not present

## 2013-10-28 DIAGNOSIS — E119 Type 2 diabetes mellitus without complications: Secondary | ICD-10-CM

## 2013-10-28 DIAGNOSIS — I4891 Unspecified atrial fibrillation: Secondary | ICD-10-CM

## 2013-10-28 DIAGNOSIS — N390 Urinary tract infection, site not specified: Secondary | ICD-10-CM | POA: Diagnosis not present

## 2013-10-28 DIAGNOSIS — N400 Enlarged prostate without lower urinary tract symptoms: Secondary | ICD-10-CM

## 2013-10-28 DIAGNOSIS — N052 Unspecified nephritic syndrome with diffuse membranous glomerulonephritis: Secondary | ICD-10-CM | POA: Diagnosis not present

## 2013-11-02 DIAGNOSIS — I10 Essential (primary) hypertension: Secondary | ICD-10-CM | POA: Diagnosis not present

## 2013-11-02 DIAGNOSIS — IMO0001 Reserved for inherently not codable concepts without codable children: Secondary | ICD-10-CM | POA: Diagnosis not present

## 2013-11-02 DIAGNOSIS — D649 Anemia, unspecified: Secondary | ICD-10-CM | POA: Diagnosis not present

## 2013-11-02 DIAGNOSIS — E785 Hyperlipidemia, unspecified: Secondary | ICD-10-CM | POA: Diagnosis not present

## 2013-11-02 DIAGNOSIS — I4891 Unspecified atrial fibrillation: Secondary | ICD-10-CM | POA: Diagnosis not present

## 2013-11-26 DIAGNOSIS — K921 Melena: Secondary | ICD-10-CM | POA: Diagnosis not present

## 2013-11-26 DIAGNOSIS — K625 Hemorrhage of anus and rectum: Secondary | ICD-10-CM | POA: Diagnosis not present

## 2013-11-26 DIAGNOSIS — E119 Type 2 diabetes mellitus without complications: Secondary | ICD-10-CM | POA: Diagnosis not present

## 2013-11-26 DIAGNOSIS — R9431 Abnormal electrocardiogram [ECG] [EKG]: Secondary | ICD-10-CM | POA: Diagnosis not present

## 2013-11-26 DIAGNOSIS — I10 Essential (primary) hypertension: Secondary | ICD-10-CM | POA: Diagnosis not present

## 2013-11-26 DIAGNOSIS — I4891 Unspecified atrial fibrillation: Secondary | ICD-10-CM | POA: Diagnosis not present

## 2013-11-26 DIAGNOSIS — K922 Gastrointestinal hemorrhage, unspecified: Secondary | ICD-10-CM | POA: Diagnosis not present

## 2013-11-26 LAB — CBC WITH DIFFERENTIAL/PLATELET
Basophil %: 1.1 %
Eosinophil #: 0.2 10*3/uL (ref 0.0–0.7)
HCT: 38.6 % — ABNORMAL LOW (ref 40.0–52.0)
HCT: 37.3 % — ABNORMAL LOW (ref 40.0–52.0)
HGB: 13.6 g/dL (ref 13.0–18.0)
Lymphocyte #: 2 10*3/uL (ref 1.0–3.6)
MCHC: 35.5 g/dL (ref 32.0–36.0)
MCV: 92 fL (ref 80–100)
Monocyte %: 10.2 %
Monocyte %: 10.3 %
Neutrophil #: 4.7 10*3/uL (ref 1.4–6.5)
Neutrophil %: 61.1 %
RBC: 4.12 10*6/uL — ABNORMAL LOW (ref 4.40–5.90)
RBC: 4.04 10*6/uL — ABNORMAL LOW (ref 4.40–5.90)
RDW: 14.5 % (ref 11.5–14.5)
RDW: 14.4 % (ref 11.5–14.5)
WBC: 8 10*3/uL (ref 3.8–10.6)
WBC: 7.8 10*3/uL (ref 3.8–10.6)

## 2013-11-26 LAB — BASIC METABOLIC PANEL
Anion Gap: 8 (ref 7–16)
Calcium, Total: 8.8 mg/dL (ref 8.5–10.1)
Chloride: 101 mmol/L (ref 98–107)
Co2: 26 mmol/L (ref 21–32)
Creatinine: 0.68 mg/dL (ref 0.60–1.30)
EGFR (African American): >60
Glucose: 196 mg/dL — ABNORMAL HIGH (ref 65–99)
Osmolality: 276 (ref 275–301)
Potassium: 4.7 mmol/L (ref 3.5–5.1)

## 2013-11-26 LAB — HEMATOCRIT: HCT: 36.2 % — ABNORMAL LOW (ref 40.0–52.0)

## 2013-11-26 LAB — HEMOGLOBIN: HGB: 12.7 g/dL — ABNORMAL LOW (ref 13.0–18.0)

## 2013-11-27 ENCOUNTER — Inpatient Hospital Stay: Payer: Self-pay | Admitting: Family Medicine

## 2013-11-27 DIAGNOSIS — Z66 Do not resuscitate: Secondary | ICD-10-CM | POA: Diagnosis present

## 2013-11-27 DIAGNOSIS — Z96659 Presence of unspecified artificial knee joint: Secondary | ICD-10-CM | POA: Diagnosis not present

## 2013-11-27 DIAGNOSIS — I4891 Unspecified atrial fibrillation: Secondary | ICD-10-CM | POA: Diagnosis not present

## 2013-11-27 DIAGNOSIS — K648 Other hemorrhoids: Secondary | ICD-10-CM | POA: Diagnosis present

## 2013-11-27 DIAGNOSIS — K922 Gastrointestinal hemorrhage, unspecified: Secondary | ICD-10-CM | POA: Diagnosis not present

## 2013-11-27 DIAGNOSIS — Z823 Family history of stroke: Secondary | ICD-10-CM | POA: Diagnosis not present

## 2013-11-27 DIAGNOSIS — E119 Type 2 diabetes mellitus without complications: Secondary | ICD-10-CM | POA: Diagnosis not present

## 2013-11-27 DIAGNOSIS — I10 Essential (primary) hypertension: Secondary | ICD-10-CM | POA: Diagnosis not present

## 2013-11-27 DIAGNOSIS — R9431 Abnormal electrocardiogram [ECG] [EKG]: Secondary | ICD-10-CM | POA: Diagnosis not present

## 2013-11-27 DIAGNOSIS — Z794 Long term (current) use of insulin: Secondary | ICD-10-CM | POA: Diagnosis not present

## 2013-11-27 DIAGNOSIS — F0390 Unspecified dementia without behavioral disturbance: Secondary | ICD-10-CM | POA: Diagnosis not present

## 2013-11-27 DIAGNOSIS — E876 Hypokalemia: Secondary | ICD-10-CM | POA: Diagnosis not present

## 2013-11-27 DIAGNOSIS — D62 Acute posthemorrhagic anemia: Secondary | ICD-10-CM | POA: Diagnosis not present

## 2013-11-27 DIAGNOSIS — K625 Hemorrhage of anus and rectum: Secondary | ICD-10-CM | POA: Diagnosis not present

## 2013-11-27 DIAGNOSIS — Z7982 Long term (current) use of aspirin: Secondary | ICD-10-CM | POA: Diagnosis not present

## 2013-11-27 DIAGNOSIS — Z880 Allergy status to penicillin: Secondary | ICD-10-CM | POA: Diagnosis not present

## 2013-11-27 DIAGNOSIS — K5731 Diverticulosis of large intestine without perforation or abscess with bleeding: Secondary | ICD-10-CM | POA: Diagnosis present

## 2013-11-27 DIAGNOSIS — D649 Anemia, unspecified: Secondary | ICD-10-CM | POA: Diagnosis not present

## 2013-11-27 DIAGNOSIS — Z79899 Other long term (current) drug therapy: Secondary | ICD-10-CM | POA: Diagnosis not present

## 2013-11-27 LAB — COMPREHENSIVE METABOLIC PANEL
Albumin: 3.1 g/dL — ABNORMAL LOW (ref 3.4–5.0)
Alkaline Phosphatase: 54 U/L
Bilirubin,Total: 0.6 mg/dL (ref 0.2–1.0)
Calcium, Total: 8.1 mg/dL — ABNORMAL LOW (ref 8.5–10.1)
EGFR (African American): >60
Glucose: 150 mg/dL — ABNORMAL HIGH (ref 65–99)
SGOT(AST): 20 U/L (ref 15–37)
SGPT (ALT): 19 U/L (ref 12–78)
Sodium: 140 mmol/L (ref 136–145)
Total Protein: 5.8 g/dL — ABNORMAL LOW (ref 6.4–8.2)

## 2013-11-27 LAB — CBC WITH DIFFERENTIAL/PLATELET
Basophil #: 0 10*3/uL (ref 0.0–0.1)
Basophil %: 0.4 %
Eosinophil #: 0.2 10*3/uL (ref 0.0–0.7)
Eosinophil #: 0.1 10*3/uL (ref 0.0–0.7)
Eosinophil %: 1.7 %
HCT: 34.4 % — ABNORMAL LOW (ref 40.0–52.0)
HCT: 38.6 % — ABNORMAL LOW (ref 40.0–52.0)
HGB: 12.5 g/dL — ABNORMAL LOW (ref 13.0–18.0)
Lymphocyte #: 1.9 10*3/uL (ref 1.0–3.6)
Lymphocyte #: 1.9 10*3/uL (ref 1.0–3.6)
MCH: 33.6 pg (ref 26.0–34.0)
MCH: 32.3 pg (ref 26.0–34.0)
MCHC: 36.2 g/dL — ABNORMAL HIGH (ref 32.0–36.0)
Monocyte #: 0.8 x10 3/mm (ref 0.2–1.0)
Monocyte %: 9.8 %
Neutrophil #: 4.2 10*3/uL (ref 1.4–6.5)
Neutrophil #: 5 10*3/uL (ref 1.4–6.5)
Neutrophil %: 59.3 %
Neutrophil %: 63.6 %
Platelet: 128 10*3/uL — ABNORMAL LOW (ref 150–440)
Platelet: 153 10*3/uL (ref 150–440)
RBC: 3.71 10*6/uL — ABNORMAL LOW (ref 4.40–5.90)
RBC: 4.1 10*6/uL — ABNORMAL LOW (ref 4.40–5.90)
RDW: 14.5 % (ref 11.5–14.5)
WBC: 7.9 10*3/uL (ref 3.8–10.6)

## 2013-11-27 LAB — MAGNESIUM: Magnesium: 1.3 mg/dL — ABNORMAL LOW

## 2013-11-28 LAB — POTASSIUM: Potassium: 3.9 mmol/L (ref 3.5–5.1)

## 2013-11-29 LAB — CBC WITH DIFFERENTIAL/PLATELET
Basophil #: 0 10*3/uL (ref 0.0–0.1)
Eosinophil #: 0.2 10*3/uL (ref 0.0–0.7)
HCT: 30.2 % — ABNORMAL LOW (ref 40.0–52.0)
HGB: 10.6 g/dL — ABNORMAL LOW (ref 13.0–18.0)
Lymphocyte #: 2 10*3/uL (ref 1.0–3.6)
MCHC: 35.3 g/dL (ref 32.0–36.0)
MCV: 94 fL (ref 80–100)
Monocyte #: 0.8 x10 3/mm (ref 0.2–1.0)
Neutrophil #: 4.1 10*3/uL (ref 1.4–6.5)
Neutrophil %: 58 %
Platelet: 139 10*3/uL — ABNORMAL LOW (ref 150–440)
RBC: 3.22 10*6/uL — ABNORMAL LOW (ref 4.40–5.90)
RDW: 14.3 % (ref 11.5–14.5)

## 2013-11-30 LAB — CBC WITH DIFFERENTIAL/PLATELET
Basophil #: 0.1 10*3/uL (ref 0.0–0.1)
Basophil %: 0.8 %
Eosinophil #: 0.2 10*3/uL (ref 0.0–0.7)
Eosinophil %: 2.7 %
HCT: 31.5 % — ABNORMAL LOW (ref 40.0–52.0)
HGB: 11.1 g/dL — ABNORMAL LOW (ref 13.0–18.0)
Lymphocyte #: 1.7 10*3/uL (ref 1.0–3.6)
MCH: 33.1 pg (ref 26.0–34.0)
MCHC: 35.3 g/dL (ref 32.0–36.0)
Neutrophil #: 4.2 10*3/uL (ref 1.4–6.5)
Neutrophil %: 59.9 %
RBC: 3.35 10*6/uL — ABNORMAL LOW (ref 4.40–5.90)

## 2013-12-29 DIAGNOSIS — N401 Enlarged prostate with lower urinary tract symptoms: Secondary | ICD-10-CM

## 2013-12-29 DIAGNOSIS — IMO0001 Reserved for inherently not codable concepts without codable children: Secondary | ICD-10-CM | POA: Diagnosis not present

## 2013-12-29 DIAGNOSIS — N052 Unspecified nephritic syndrome with diffuse membranous glomerulonephritis: Secondary | ICD-10-CM | POA: Diagnosis not present

## 2013-12-29 DIAGNOSIS — E1165 Type 2 diabetes mellitus with hyperglycemia: Secondary | ICD-10-CM

## 2013-12-29 DIAGNOSIS — I498 Other specified cardiac arrhythmias: Secondary | ICD-10-CM

## 2014-01-14 DIAGNOSIS — R0602 Shortness of breath: Secondary | ICD-10-CM | POA: Diagnosis not present

## 2014-01-14 DIAGNOSIS — R5381 Other malaise: Secondary | ICD-10-CM | POA: Diagnosis not present

## 2014-01-14 DIAGNOSIS — J189 Pneumonia, unspecified organism: Secondary | ICD-10-CM | POA: Diagnosis not present

## 2014-01-14 DIAGNOSIS — J988 Other specified respiratory disorders: Secondary | ICD-10-CM | POA: Diagnosis not present

## 2014-01-14 DIAGNOSIS — R52 Pain, unspecified: Secondary | ICD-10-CM | POA: Diagnosis not present

## 2014-01-14 DIAGNOSIS — R0902 Hypoxemia: Secondary | ICD-10-CM | POA: Diagnosis not present

## 2014-01-14 LAB — CBC WITH DIFFERENTIAL/PLATELET
Basophil #: 0 10*3/uL (ref 0.0–0.1)
Basophil %: 0.5 %
Eosinophil #: 0.1 10*3/uL (ref 0.0–0.7)
Eosinophil %: 1 %
HCT: 37.9 % — AB (ref 40.0–52.0)
HGB: 12.6 g/dL — AB (ref 13.0–18.0)
Lymphocyte #: 0.8 10*3/uL — ABNORMAL LOW (ref 1.0–3.6)
Lymphocyte %: 10.7 %
MCH: 30 pg (ref 26.0–34.0)
MCHC: 33.2 g/dL (ref 32.0–36.0)
MCV: 90 fL (ref 80–100)
MONO ABS: 0.7 x10 3/mm (ref 0.2–1.0)
MONOS PCT: 10.2 %
NEUTROS PCT: 77.6 %
Neutrophil #: 5.4 10*3/uL (ref 1.4–6.5)
Platelet: 160 10*3/uL (ref 150–440)
RBC: 4.21 10*6/uL — AB (ref 4.40–5.90)
RDW: 15.1 % — ABNORMAL HIGH (ref 11.5–14.5)
WBC: 7 10*3/uL (ref 3.8–10.6)

## 2014-01-14 LAB — BASIC METABOLIC PANEL
Anion Gap: 7 (ref 7–16)
BUN: 14 mg/dL (ref 7–18)
CHLORIDE: 103 mmol/L (ref 98–107)
Calcium, Total: 8.8 mg/dL (ref 8.5–10.1)
Co2: 25 mmol/L (ref 21–32)
Creatinine: 0.98 mg/dL (ref 0.60–1.30)
EGFR (African American): >60
EGFR (Non-African Amer.): >60
GLUCOSE: 192 mg/dL — AB (ref 65–99)
Osmolality: 276 (ref 275–301)
POTASSIUM: 3.6 mmol/L (ref 3.5–5.1)
SODIUM: 135 mmol/L — AB (ref 136–145)

## 2014-01-14 LAB — TROPONIN I: Troponin-I: <0.02 ng/mL

## 2014-01-15 ENCOUNTER — Telehealth: Payer: Self-pay | Admitting: Family Medicine

## 2014-01-15 ENCOUNTER — Inpatient Hospital Stay: Payer: Self-pay | Admitting: Internal Medicine

## 2014-01-15 DIAGNOSIS — E119 Type 2 diabetes mellitus without complications: Secondary | ICD-10-CM | POA: Diagnosis not present

## 2014-01-15 DIAGNOSIS — I4891 Unspecified atrial fibrillation: Secondary | ICD-10-CM | POA: Diagnosis present

## 2014-01-15 DIAGNOSIS — I714 Abdominal aortic aneurysm, without rupture, unspecified: Secondary | ICD-10-CM | POA: Diagnosis present

## 2014-01-15 DIAGNOSIS — Z66 Do not resuscitate: Secondary | ICD-10-CM | POA: Diagnosis present

## 2014-01-15 DIAGNOSIS — R5383 Other fatigue: Secondary | ICD-10-CM | POA: Diagnosis not present

## 2014-01-15 DIAGNOSIS — Z96659 Presence of unspecified artificial knee joint: Secondary | ICD-10-CM | POA: Diagnosis not present

## 2014-01-15 DIAGNOSIS — R0902 Hypoxemia: Secondary | ICD-10-CM | POA: Diagnosis not present

## 2014-01-15 DIAGNOSIS — Z794 Long term (current) use of insulin: Secondary | ICD-10-CM | POA: Diagnosis not present

## 2014-01-15 DIAGNOSIS — R5381 Other malaise: Secondary | ICD-10-CM | POA: Diagnosis not present

## 2014-01-15 DIAGNOSIS — J189 Pneumonia, unspecified organism: Secondary | ICD-10-CM | POA: Diagnosis not present

## 2014-01-15 DIAGNOSIS — I10 Essential (primary) hypertension: Secondary | ICD-10-CM | POA: Diagnosis not present

## 2014-01-15 DIAGNOSIS — R059 Cough, unspecified: Secondary | ICD-10-CM | POA: Diagnosis not present

## 2014-01-15 DIAGNOSIS — R0602 Shortness of breath: Secondary | ICD-10-CM | POA: Diagnosis not present

## 2014-01-15 DIAGNOSIS — K219 Gastro-esophageal reflux disease without esophagitis: Secondary | ICD-10-CM | POA: Diagnosis not present

## 2014-01-15 LAB — URINALYSIS, COMPLETE
BILIRUBIN, UR: NEGATIVE
Bacteria: NONE SEEN
Blood: NEGATIVE
Glucose,UR: 50 mg/dL (ref 0–75)
Ketone: NEGATIVE
LEUKOCYTE ESTERASE: NEGATIVE
NITRITE: NEGATIVE
Ph: 5 (ref 4.5–8.0)
Protein: 30
RBC,UR: 1 /HPF (ref 0–5)
SPECIFIC GRAVITY: 1.024 (ref 1.003–1.030)
SQUAMOUS EPITHELIAL: NONE SEEN

## 2014-01-15 NOTE — Telephone Encounter (Signed)
[facility] Triage Call Report Triage Record Num: [id number] Operator: Ashok Croon Patient Name: Benjamin Grant Call Date & Time: 01/14/2014 8:02:02PM Patient Phone: [phone] PCP: Viviana Simpler Patient Gender: Male PCP Fax : [phone] Patient DOB: 05-29-1930 Practice Name: [facility] Reason for Call: Caller: Teresa/RN; PCP: Viviana Simpler (Family Practice); CB#: [phone]; Call regarding Fever went up after Tylenol, Now at 101. 5 and cough; Onset 01/13/2014; Lungs clear are clear, Several residents have an uppper respiratory illness, however, this resident is the only one with a significant fever. O2 Sats are 89- 92. Emergent s/sx of Flu-Like Symptoms, Sudden change in mental status identified. RN will call the family as they usually do not want [name] sent out. Advised that there have been many deaths from the flu this year, strongly encourage sending to the ER for evaluation. Will place f/u call for ~ 30 minutes, if family refuses for patient to be sent out will follow standing orders for Flu and Fever. Called to Dr. Linna Darner, as Dr. Silvio Pate prefers pts be treated in house if at all possible. Dr. Linna Darner stressed it is very important that patient be sent to ER with these s/sx. Called back to f/u with Helene Kelp at 20:36 - Per Helene Kelp patient is being picked up by EMS at this moment to be transferred to ER for evaluation. Protocol(s) Used: Cough - Adult Protocol(s) Used: Flu-Like Symptoms Recommended Outcome per Protocol: See ED Immediately Reason for Outcome: Sudden onset of flu-like symptoms Sudden change in mental status Care Advice: ~ IMMEDIATE ACTION 01/14/2014 8:38:49PM Page 1 of 1 CAN_TriageRpt_V2

## 2014-01-15 NOTE — Telephone Encounter (Signed)
Will follow up at [facility] 

## 2014-01-16 DIAGNOSIS — J189 Pneumonia, unspecified organism: Secondary | ICD-10-CM | POA: Diagnosis not present

## 2014-01-16 DIAGNOSIS — I10 Essential (primary) hypertension: Secondary | ICD-10-CM | POA: Diagnosis not present

## 2014-01-16 DIAGNOSIS — K219 Gastro-esophageal reflux disease without esophagitis: Secondary | ICD-10-CM | POA: Diagnosis not present

## 2014-01-16 DIAGNOSIS — E119 Type 2 diabetes mellitus without complications: Secondary | ICD-10-CM | POA: Diagnosis not present

## 2014-01-16 LAB — BASIC METABOLIC PANEL
Anion Gap: 6 — ABNORMAL LOW (ref 7–16)
BUN: 14 mg/dL (ref 7–18)
CALCIUM: 8.5 mg/dL (ref 8.5–10.1)
CHLORIDE: 99 mmol/L (ref 98–107)
Co2: 26 mmol/L (ref 21–32)
Creatinine: 0.87 mg/dL (ref 0.60–1.30)
EGFR (Non-African Amer.): >60
GLUCOSE: 153 mg/dL — AB (ref 65–99)
OSMOLALITY: 266 (ref 275–301)
Potassium: 3.1 mmol/L — ABNORMAL LOW (ref 3.5–5.1)
SODIUM: 131 mmol/L — AB (ref 136–145)

## 2014-01-16 LAB — CBC WITH DIFFERENTIAL/PLATELET
Basophil #: 0 10*3/uL (ref 0.0–0.1)
Basophil %: 0.2 %
EOS ABS: 0 10*3/uL (ref 0.0–0.7)
Eosinophil %: 0 %
HCT: 36.5 % — ABNORMAL LOW (ref 40.0–52.0)
HGB: 12.3 g/dL — ABNORMAL LOW (ref 13.0–18.0)
LYMPHS ABS: 0.6 10*3/uL — AB (ref 1.0–3.6)
LYMPHS PCT: 7.5 %
MCH: 29.8 pg (ref 26.0–34.0)
MCHC: 33.7 g/dL (ref 32.0–36.0)
MCV: 89 fL (ref 80–100)
MONOS PCT: 7 %
Monocyte #: 0.6 x10 3/mm (ref 0.2–1.0)
NEUTROS PCT: 85.3 %
Neutrophil #: 7.3 10*3/uL — ABNORMAL HIGH (ref 1.4–6.5)
Platelet: 145 10*3/uL — ABNORMAL LOW (ref 150–440)
RBC: 4.12 10*6/uL — ABNORMAL LOW (ref 4.40–5.90)
RDW: 14.9 % — ABNORMAL HIGH (ref 11.5–14.5)
WBC: 8.6 10*3/uL (ref 3.8–10.6)

## 2014-01-17 DIAGNOSIS — E119 Type 2 diabetes mellitus without complications: Secondary | ICD-10-CM | POA: Diagnosis not present

## 2014-01-17 DIAGNOSIS — I10 Essential (primary) hypertension: Secondary | ICD-10-CM | POA: Diagnosis not present

## 2014-01-17 DIAGNOSIS — J189 Pneumonia, unspecified organism: Secondary | ICD-10-CM | POA: Diagnosis not present

## 2014-01-17 DIAGNOSIS — K219 Gastro-esophageal reflux disease without esophagitis: Secondary | ICD-10-CM | POA: Diagnosis not present

## 2014-01-17 LAB — BASIC METABOLIC PANEL
Anion Gap: 3 — ABNORMAL LOW (ref 7–16)
BUN: 18 mg/dL (ref 7–18)
CALCIUM: 8.7 mg/dL (ref 8.5–10.1)
Chloride: 100 mmol/L (ref 98–107)
Co2: 28 mmol/L (ref 21–32)
Creatinine: 0.94 mg/dL (ref 0.60–1.30)
EGFR (Non-African Amer.): >60
GLUCOSE: 245 mg/dL — AB (ref 65–99)
Osmolality: 273 (ref 275–301)
POTASSIUM: 3.9 mmol/L (ref 3.5–5.1)
Sodium: 131 mmol/L — ABNORMAL LOW (ref 136–145)

## 2014-01-18 DIAGNOSIS — K219 Gastro-esophageal reflux disease without esophagitis: Secondary | ICD-10-CM | POA: Diagnosis not present

## 2014-01-18 DIAGNOSIS — I10 Essential (primary) hypertension: Secondary | ICD-10-CM | POA: Diagnosis not present

## 2014-01-18 DIAGNOSIS — E119 Type 2 diabetes mellitus without complications: Secondary | ICD-10-CM | POA: Diagnosis not present

## 2014-01-18 DIAGNOSIS — J189 Pneumonia, unspecified organism: Secondary | ICD-10-CM | POA: Diagnosis not present

## 2014-01-20 LAB — CULTURE, BLOOD (SINGLE)

## 2014-02-24 DIAGNOSIS — E119 Type 2 diabetes mellitus without complications: Secondary | ICD-10-CM | POA: Diagnosis not present

## 2014-02-24 DIAGNOSIS — N052 Unspecified nephritic syndrome with diffuse membranous glomerulonephritis: Secondary | ICD-10-CM | POA: Diagnosis not present

## 2014-02-24 DIAGNOSIS — N138 Other obstructive and reflux uropathy: Secondary | ICD-10-CM | POA: Diagnosis not present

## 2014-02-24 DIAGNOSIS — N401 Enlarged prostate with lower urinary tract symptoms: Secondary | ICD-10-CM | POA: Diagnosis not present

## 2014-02-24 DIAGNOSIS — I4891 Unspecified atrial fibrillation: Secondary | ICD-10-CM

## 2014-03-10 DIAGNOSIS — C4499 Other specified malignant neoplasm of skin, unspecified: Secondary | ICD-10-CM | POA: Diagnosis not present

## 2014-04-01 DIAGNOSIS — I714 Abdominal aortic aneurysm, without rupture, unspecified: Secondary | ICD-10-CM | POA: Diagnosis not present

## 2014-04-01 DIAGNOSIS — I10 Essential (primary) hypertension: Secondary | ICD-10-CM | POA: Diagnosis not present

## 2014-04-01 DIAGNOSIS — M79609 Pain in unspecified limb: Secondary | ICD-10-CM | POA: Diagnosis not present

## 2014-04-01 DIAGNOSIS — I739 Peripheral vascular disease, unspecified: Secondary | ICD-10-CM | POA: Diagnosis not present

## 2014-04-06 DIAGNOSIS — Z85828 Personal history of other malignant neoplasm of skin: Secondary | ICD-10-CM | POA: Diagnosis not present

## 2014-04-06 DIAGNOSIS — L578 Other skin changes due to chronic exposure to nonionizing radiation: Secondary | ICD-10-CM | POA: Diagnosis not present

## 2014-04-06 DIAGNOSIS — L820 Inflamed seborrheic keratosis: Secondary | ICD-10-CM | POA: Diagnosis not present

## 2014-04-06 DIAGNOSIS — L570 Actinic keratosis: Secondary | ICD-10-CM | POA: Diagnosis not present

## 2014-04-27 DIAGNOSIS — IMO0001 Reserved for inherently not codable concepts without codable children: Secondary | ICD-10-CM

## 2014-04-27 DIAGNOSIS — F3289 Other specified depressive episodes: Secondary | ICD-10-CM

## 2014-04-27 DIAGNOSIS — N052 Unspecified nephritic syndrome with diffuse membranous glomerulonephritis: Secondary | ICD-10-CM

## 2014-04-27 DIAGNOSIS — F329 Major depressive disorder, single episode, unspecified: Secondary | ICD-10-CM

## 2014-04-27 DIAGNOSIS — E782 Mixed hyperlipidemia: Secondary | ICD-10-CM

## 2014-04-27 DIAGNOSIS — R413 Other amnesia: Secondary | ICD-10-CM

## 2014-04-27 DIAGNOSIS — I4891 Unspecified atrial fibrillation: Secondary | ICD-10-CM | POA: Diagnosis not present

## 2014-04-27 DIAGNOSIS — I10 Essential (primary) hypertension: Secondary | ICD-10-CM | POA: Diagnosis not present

## 2014-04-27 DIAGNOSIS — E1165 Type 2 diabetes mellitus with hyperglycemia: Secondary | ICD-10-CM

## 2014-04-27 DIAGNOSIS — N400 Enlarged prostate without lower urinary tract symptoms: Secondary | ICD-10-CM

## 2014-05-03 DIAGNOSIS — E785 Hyperlipidemia, unspecified: Secondary | ICD-10-CM | POA: Diagnosis not present

## 2014-05-03 DIAGNOSIS — D649 Anemia, unspecified: Secondary | ICD-10-CM | POA: Diagnosis not present

## 2014-05-03 DIAGNOSIS — E119 Type 2 diabetes mellitus without complications: Secondary | ICD-10-CM | POA: Diagnosis not present

## 2014-05-03 DIAGNOSIS — Z79899 Other long term (current) drug therapy: Secondary | ICD-10-CM | POA: Diagnosis not present

## 2014-05-03 DIAGNOSIS — I10 Essential (primary) hypertension: Secondary | ICD-10-CM | POA: Diagnosis not present

## 2014-06-07 DIAGNOSIS — D239 Other benign neoplasm of skin, unspecified: Secondary | ICD-10-CM | POA: Diagnosis not present

## 2014-06-07 DIAGNOSIS — L570 Actinic keratosis: Secondary | ICD-10-CM | POA: Diagnosis not present

## 2014-06-07 DIAGNOSIS — L820 Inflamed seborrheic keratosis: Secondary | ICD-10-CM | POA: Diagnosis not present

## 2014-06-07 DIAGNOSIS — Z85828 Personal history of other malignant neoplasm of skin: Secondary | ICD-10-CM | POA: Diagnosis not present

## 2014-06-30 DIAGNOSIS — IMO0001 Reserved for inherently not codable concepts without codable children: Secondary | ICD-10-CM | POA: Diagnosis not present

## 2014-06-30 DIAGNOSIS — E782 Mixed hyperlipidemia: Secondary | ICD-10-CM

## 2014-06-30 DIAGNOSIS — E1165 Type 2 diabetes mellitus with hyperglycemia: Secondary | ICD-10-CM

## 2014-06-30 DIAGNOSIS — N400 Enlarged prostate without lower urinary tract symptoms: Secondary | ICD-10-CM

## 2014-06-30 DIAGNOSIS — F329 Major depressive disorder, single episode, unspecified: Secondary | ICD-10-CM

## 2014-06-30 DIAGNOSIS — N052 Unspecified nephritic syndrome with diffuse membranous glomerulonephritis: Secondary | ICD-10-CM

## 2014-06-30 DIAGNOSIS — I10 Essential (primary) hypertension: Secondary | ICD-10-CM

## 2014-06-30 DIAGNOSIS — I4891 Unspecified atrial fibrillation: Secondary | ICD-10-CM

## 2014-06-30 DIAGNOSIS — F0280 Dementia in other diseases classified elsewhere without behavioral disturbance: Secondary | ICD-10-CM

## 2014-06-30 DIAGNOSIS — F3289 Other specified depressive episodes: Secondary | ICD-10-CM

## 2014-06-30 DIAGNOSIS — G309 Alzheimer's disease, unspecified: Secondary | ICD-10-CM

## 2014-09-08 DIAGNOSIS — I4891 Unspecified atrial fibrillation: Secondary | ICD-10-CM | POA: Diagnosis not present

## 2014-09-08 DIAGNOSIS — E782 Mixed hyperlipidemia: Secondary | ICD-10-CM

## 2014-09-08 DIAGNOSIS — N052 Unspecified nephritic syndrome with diffuse membranous glomerulonephritis: Secondary | ICD-10-CM

## 2014-09-08 DIAGNOSIS — I10 Essential (primary) hypertension: Secondary | ICD-10-CM

## 2014-09-08 DIAGNOSIS — E119 Type 2 diabetes mellitus without complications: Secondary | ICD-10-CM

## 2014-09-08 DIAGNOSIS — R413 Other amnesia: Secondary | ICD-10-CM | POA: Diagnosis not present

## 2014-09-08 DIAGNOSIS — F329 Major depressive disorder, single episode, unspecified: Secondary | ICD-10-CM

## 2014-09-08 DIAGNOSIS — N400 Enlarged prostate without lower urinary tract symptoms: Secondary | ICD-10-CM

## 2014-09-08 DIAGNOSIS — F3289 Other specified depressive episodes: Secondary | ICD-10-CM

## 2014-09-08 DIAGNOSIS — B029 Zoster without complications: Secondary | ICD-10-CM

## 2014-09-27 DIAGNOSIS — B0222 Postherpetic trigeminal neuralgia: Secondary | ICD-10-CM | POA: Diagnosis not present

## 2014-10-07 DIAGNOSIS — H26492 Other secondary cataract, left eye: Secondary | ICD-10-CM | POA: Diagnosis not present

## 2014-10-07 LAB — HM DIABETES EYE EXAM

## 2014-10-20 ENCOUNTER — Encounter: Payer: Self-pay | Admitting: Internal Medicine

## 2014-10-25 DIAGNOSIS — I10 Essential (primary) hypertension: Secondary | ICD-10-CM | POA: Diagnosis not present

## 2014-10-25 DIAGNOSIS — Z79899 Other long term (current) drug therapy: Secondary | ICD-10-CM | POA: Diagnosis not present

## 2014-10-25 DIAGNOSIS — E785 Hyperlipidemia, unspecified: Secondary | ICD-10-CM | POA: Diagnosis not present

## 2014-10-25 DIAGNOSIS — E119 Type 2 diabetes mellitus without complications: Secondary | ICD-10-CM | POA: Diagnosis not present

## 2014-10-25 DIAGNOSIS — D649 Anemia, unspecified: Secondary | ICD-10-CM | POA: Diagnosis not present

## 2014-10-27 DIAGNOSIS — K219 Gastro-esophageal reflux disease without esophagitis: Secondary | ICD-10-CM | POA: Diagnosis not present

## 2014-10-27 DIAGNOSIS — I10 Essential (primary) hypertension: Secondary | ICD-10-CM | POA: Diagnosis not present

## 2014-10-27 DIAGNOSIS — E785 Hyperlipidemia, unspecified: Secondary | ICD-10-CM | POA: Diagnosis not present

## 2014-10-27 DIAGNOSIS — E1165 Type 2 diabetes mellitus with hyperglycemia: Secondary | ICD-10-CM

## 2014-10-27 DIAGNOSIS — F0390 Unspecified dementia without behavioral disturbance: Secondary | ICD-10-CM

## 2014-10-27 DIAGNOSIS — F323 Major depressive disorder, single episode, severe with psychotic features: Secondary | ICD-10-CM

## 2014-10-27 DIAGNOSIS — I4891 Unspecified atrial fibrillation: Secondary | ICD-10-CM

## 2014-10-27 DIAGNOSIS — N401 Enlarged prostate with lower urinary tract symptoms: Secondary | ICD-10-CM | POA: Diagnosis not present

## 2014-10-27 DIAGNOSIS — B029 Zoster without complications: Secondary | ICD-10-CM | POA: Diagnosis not present

## 2014-12-06 DIAGNOSIS — L570 Actinic keratosis: Secondary | ICD-10-CM | POA: Diagnosis not present

## 2014-12-06 DIAGNOSIS — L820 Inflamed seborrheic keratosis: Secondary | ICD-10-CM | POA: Diagnosis not present

## 2014-12-06 DIAGNOSIS — L578 Other skin changes due to chronic exposure to nonionizing radiation: Secondary | ICD-10-CM | POA: Diagnosis not present

## 2014-12-06 DIAGNOSIS — Z85828 Personal history of other malignant neoplasm of skin: Secondary | ICD-10-CM | POA: Diagnosis not present

## 2014-12-06 DIAGNOSIS — L821 Other seborrheic keratosis: Secondary | ICD-10-CM | POA: Diagnosis not present

## 2014-12-29 DIAGNOSIS — E785 Hyperlipidemia, unspecified: Secondary | ICD-10-CM | POA: Diagnosis not present

## 2014-12-29 DIAGNOSIS — N401 Enlarged prostate with lower urinary tract symptoms: Secondary | ICD-10-CM

## 2014-12-29 DIAGNOSIS — I4891 Unspecified atrial fibrillation: Secondary | ICD-10-CM

## 2014-12-29 DIAGNOSIS — G3184 Mild cognitive impairment, so stated: Secondary | ICD-10-CM | POA: Diagnosis not present

## 2014-12-29 DIAGNOSIS — F323 Major depressive disorder, single episode, severe with psychotic features: Secondary | ICD-10-CM | POA: Diagnosis not present

## 2014-12-29 DIAGNOSIS — I10 Essential (primary) hypertension: Secondary | ICD-10-CM

## 2014-12-29 DIAGNOSIS — E1165 Type 2 diabetes mellitus with hyperglycemia: Secondary | ICD-10-CM

## 2014-12-29 DIAGNOSIS — K219 Gastro-esophageal reflux disease without esophagitis: Secondary | ICD-10-CM | POA: Diagnosis not present

## 2015-02-23 DIAGNOSIS — N401 Enlarged prostate with lower urinary tract symptoms: Secondary | ICD-10-CM | POA: Diagnosis not present

## 2015-02-23 DIAGNOSIS — K219 Gastro-esophageal reflux disease without esophagitis: Secondary | ICD-10-CM

## 2015-02-23 DIAGNOSIS — I4891 Unspecified atrial fibrillation: Secondary | ICD-10-CM

## 2015-02-23 DIAGNOSIS — E11638 Type 2 diabetes mellitus with other oral complications: Secondary | ICD-10-CM | POA: Diagnosis not present

## 2015-02-23 DIAGNOSIS — E785 Hyperlipidemia, unspecified: Secondary | ICD-10-CM | POA: Diagnosis not present

## 2015-02-23 DIAGNOSIS — F323 Major depressive disorder, single episode, severe with psychotic features: Secondary | ICD-10-CM

## 2015-02-23 DIAGNOSIS — I10 Essential (primary) hypertension: Secondary | ICD-10-CM | POA: Diagnosis not present

## 2015-02-23 DIAGNOSIS — G3184 Mild cognitive impairment, so stated: Secondary | ICD-10-CM

## 2015-03-17 DIAGNOSIS — J029 Acute pharyngitis, unspecified: Secondary | ICD-10-CM | POA: Diagnosis not present

## 2015-04-06 DIAGNOSIS — I714 Abdominal aortic aneurysm, without rupture: Secondary | ICD-10-CM | POA: Diagnosis not present

## 2015-04-06 DIAGNOSIS — E119 Type 2 diabetes mellitus without complications: Secondary | ICD-10-CM | POA: Diagnosis not present

## 2015-04-06 DIAGNOSIS — E669 Obesity, unspecified: Secondary | ICD-10-CM | POA: Diagnosis not present

## 2015-04-06 DIAGNOSIS — E785 Hyperlipidemia, unspecified: Secondary | ICD-10-CM | POA: Diagnosis not present

## 2015-04-06 DIAGNOSIS — I10 Essential (primary) hypertension: Secondary | ICD-10-CM | POA: Diagnosis not present

## 2015-04-06 DIAGNOSIS — M79609 Pain in unspecified limb: Secondary | ICD-10-CM | POA: Diagnosis not present

## 2015-04-15 NOTE — H&P (Signed)
PATIENT NAME:  Benjamin Grant, Benjamin Grant MR#:  [id number] DATE OF BIRTH:  12/23/1930  DATE OF ADMISSION:  11/26/2013  PRIMARY CARE PHYSICIAN: Viviana Simpler, MD  REQUESTING PHYSICIAN: Dr. Beather Arbour  CHIEF COMPLAINT: GI bleed.   HISTORY OF PRESENT ILLNESS: The patient is an 81 year old male with a known history of A-fib, diabetes, and hypertension who is being admitted for bright red blood per rectum. The patient lives at [facility] memory care center where he was reported to have 1 bloody bowel movement this morning before going for breakfast. He also reports having similar bloody movement several years ago and it stopped. The patient was concerned and decided to come to the Emergency Department. While in the ED, he is still worried about his bright red blood per rectum and he is being admitted for evaluation and management.   PAST MEDICAL HISTORY: 1.  A-fib. 2.  Diabetes. 3.  Hypertension.  4.  Abdominal aortic aneurysm.  5.  GI bleed in the past. 6.  Abdominal distention with post-obstructive renal failure and bladder outlet obstruction.   PAST SURGICAL HISTORY: Right knee replacement.   ALLERGIES: PENICILLIN.   SOCIAL HISTORY: He lives at [facility] memory care unit. No smoking, alcohol, or drug use. He lives with his wife.   FAMILY HISTORY: Mother died of old age. Father died in his 51s due to CVA.   HOME MEDICATIONS: 1.  Aspirin 81 mg p.o. daily.  2.  Cardizem CD 180 mg p.o. daily. 3.  Citalopram 10 mg p.o. daily.  4.  Digoxin 0.125 mg p.o. daily. 5.  Donepezil 5 mg p.o. daily.  6.  Famotidine 20 mg p.o. b.i.d.  7.  Flomax 0.4 mg p.o. daily. 8.  Januvia 100 mg p.o. daily.  9.  Insulin Lantus 14 units subcu daily.  10.  Meclizine 25 mg p.o. 1/2 tablet every 6 hours as needed.  11.  Metformin 500 mg p.o. 2 tablets daily.  12.  Metoprolol ER 100 mg p.o. daily. 13.  Namenda 5 mg p.o. b.i.d.  14.  Proscar 500 mg p.o. daily.  15.  Simvastatin 20 mg p.o. daily.   REVIEW OF  SYSTEMS: CONSTITUTIONAL: No fever, fatigue, weakness.  EYES: No blurred or double vision.  ENT: No tinnitus or ear pain.  RESPIRATORY: No cough, wheezing, or hemoptysis.  CARDIOVASCULAR: No chest pain, orthopnea, or edema.  GASTROINTESTINAL: No nausea or vomiting. Positive for bright red blood per rectum.  GENITOURINARY: No dysuria or hematuria.  ENDOCRINE: No polyuria or nocturia.  HEMATOLOGY: No anemia or easy bruising.  SKIN: No rash or lesion.  MUSCULOSKELETAL: No arthritis or muscle cramp.  NEUROLOGIC: No tingling, numbness, or weakness.  PSYCHIATRIC: No history of anxiety or depression.   PHYSICAL EXAMINATION: VITAL SIGNS: Temperature 98, heart rate 66 per minute, respirations 18 per minute, blood pressure 153/81 mmHg, and he is saturating 97% on room air.  GENERAL: The patient is a 81 year old male lying in the bed comfortably without any acute distress.  EYES: Pupils equal, round, and reactive to light and accommodation. No scleral icterus. Extraocular muscles intact.  HEENT: Head atraumatic, normocephalic. Oropharynx and nasopharynx clear.  NECK: Supple. No jugular venous distention. No thyroid enlargement or tenderness. LUNGS: Clear to auscultation bilaterally. No wheezing, rales, rhonchi, or crepitation.  CARDIOVASCULAR: S1, S2 normal. No murmurs, rubs, or gallop.  ABDOMEN: Soft, nontender, and nondistended. Bowel sounds present. No organomegaly or masses. Obesity present. EXTREMITIES: No pedal edema, cyanosis, or clubbing.  NEUROLOGIC: Nonfocal examination. Cranial nerves II through XII  intact. Muscle strength 5/5 in all extremities. Sensation intact.  PSYCHIATRIC: The patient is alert and oriented x3.  SKIN: No obvious rash, lesion, or ulcer.   LABORATORY AND DIAGNOSTICS: Normal BMP. Normal CBC except hematocrit of 37.3 and platelets 134.   EKG shows normal sinus rhythm, left anterior fascicular block. No major ST-T changes.  IMPRESSION AND PLAN: 1.  Bright red blood  per rectum, likely diverticular bleed. We will place him under observation, hold his aspirin, and monitor hemoglobin and hematocrit every 8 hours, and if no significant drop or further bleed possible discharge tomorrow.  2.  Chronic atrial fibrillation. Now seems to be in sinus rhythm. Will continue digoxin, Cardizem, and metoprolol.  3.  Hypertension. We will continue home medication as above. 4.  Diabetes. We will hold his home medication and insulin, place him on sliding scale insulin for now as will be putting him on clear liquid diet only and advance as tolerated. His home medication can be resumed on discharge.   CODE STATUS: FULL.  If the patient bleeds further, consider gastroenterology consult at that point.  TOTAL TIME TAKING CARE OF THIS PATIENT: 55 minutes. ____________________________ Lucina Mellow. Manuella Ghazi, MD vss:sb D: 11/26/2013 13:58:51 ET T: 11/26/2013 14:28:18 ET JOB#: [id number]  cc: Danyel Griess S. Manuella Ghazi, MD, <Dictator> Venia Carbon, MD Lucina Mellow [facility] MD ELECTRONICALLY SIGNED 11/27/2013 18:12

## 2015-04-15 NOTE — Consult Note (Signed)
PATIENT NAME:  Benjamin Grant, Benjamin Grant MR#:  [id number] DATE OF BIRTH:  Dec 17, 1930  DATE OF CONSULTATION:  11/27/2013 DATE OF ADMISSION:  11/26/2013  CONSULTING SERVICE:  Gastroenterology CONSULTING PHYSICIAN:  Lucilla Lame, MD  REASON FOR CONSULTATION: Lower GI bleed.   HISTORY OF PRESENT ILLNESS: This patient is an 81 year old gentleman who has memory loss and lives at a memory center. The patient does report that he recalls having a colonoscopy, which was done in 2010 by Dr. Dionne Milo that showed internal hemorrhoids and diverticulosis. He had that colonoscopy at that time for lower GI bleeding. He also reports that he has had a history of diverticulosis for many years. The patient now reports that he came in with bright red blood per rectum, and he was concerned about that. There is no more bright red blood per rectum, and he states that his bowel movements are now darker, older blood. His hemoglobin on admission was 13.6. It is now 13.2 this morning. There is no report of any abdominal pain associated with his rectal bleeding. He also denies any nausea or vomiting, fevers or chills. I am now being asked to see the patient for his lower GI bleeding.   PAST MEDICAL HISTORY: Afib, diabetes, hypertension, abdominal aortic aneurysm, GI bleed in the past, aortic dissection with post obstructive renal failure and bladder outlet obstruction.   PAST SURGICAL HISTORY: Right knee replacement.   ALLERGIES: PENICILLIN.   SOCIAL HISTORY: No smoking, tobacco or drug use.   FAMILY HISTORY: Noncontributory.   HOME MEDICATIONS: Aspirin, Cardizem, citalopram, digoxin, donepezil, famotidine, Flomax, Januvia,  insulin, meclizine, metformin, metoprolol, Namenda, Proscar and simvastatin.   REVIEW OF SYSTEMS:  A ten-point review of systems negative except what was stated above.   PHYSICAL EXAMINATION: GENERAL: The patient sitting up on the bed, in no apparent distress, answering questions appropriately. He reports  that he does have some trouble with his memory.  VITAL SIGNS: Temperature 98, pulse 62, respirations 18, blood pressure 131/81, pulse oximetry 98%. The patient alert and orientated.  HEENT: Normocephalic, atraumatic. Extraocular motor intact. Pupils equally round and reactive to light and accommodation without JVD, without lymphadenopathy.  NECK: Supple without JVD.  LUNGS: Clear to auscultation bilaterally.  HEART: Regular rate and rhythm without murmurs, rubs or gallops.  ABDOMEN: Soft, nontender, nondistended without hepatosplenomegaly, without rebound, without guarding.  EXTREMITIES: Without cyanosis, clubbing or edema.  NEUROLOGICAL: Grossly intact.  SKIN: Without any rashes or lesions.   ANCILLARY SERVICES: Hemoglobin as stated above.   IMPRESSION AND PLAN:  This patient is an 81 year old gentleman who has a history of a colonoscopy in 2010 with rectal bleeding, at that time. The patient had some diverticulosis and some internal hemorrhoids. The patient states that his bright red blood has stopped, and now he is just passing some old blood. His hemoglobin is stable, and we will continue to watch him. I do not believe that this gentleman needs an urgent colonoscopy at this time. The patient has been explained that if he continues to bleed or has a drop in his hemoglobin, then we can reconsider doing a colonoscopy on him.   Thank you very much for involving me in the care of this patient. If you have any questions, please do not hesitate to call.     ____________________________ Lucilla Lame, MD dw:dmm D: 11/27/2013 19:31:21 ET T: 11/27/2013 21:00:07 ET JOB#: [id number]  cc: Lucilla Lame, MD, <Dictator> Lucilla Lame MD ELECTRONICALLY SIGNED 11/29/2013 16:06

## 2015-04-15 NOTE — Consult Note (Signed)
Brief Consult Note: Diagnosis: Patient with lower GI bleed.   Patient was seen by consultant.   Consult note dictated.   Comments: The patient has a history of lower GI bleeding with a colonoscopy by Dr. Leta Speller showing hemorrhoids and diverticulosis. His Hb is improved and his blood is turning darker consistant with old blood. Will follow with you and scope if repeat bleeding of Hb drop.  Electronic Signatures: Lucilla Lame (MD)  (Signed 05-Dec-14 17:45)  Authored: Brief Consult Note   Last Updated: 05-Dec-14 17:45 by Lucilla Lame (MD)

## 2015-04-15 NOTE — Discharge Summary (Signed)
PATIENT NAME:  Benjamin Grant, PEPITONE MR#:  [id number] DATE OF BIRTH:  [birth year]  DATE OF ADMISSION:  11/27/2013 DATE OF DISCHARGE:  11/30/2013  REASON FOR ADMISSION:  Bright red blood per rectum, GI bleeding.   DISCHARGE DIAGNOSES: 1.  Lower gastrointestinal bleeding, likely diverticular in nature.  2.  Acute blood loss anemia.  3.  Hypokalemia, now resolved.  4.  Hypomagnesemia, now resolved.  5.  Hypertension.  6.  Chronic atrial fibrillation, rate controlled.  7.  Diabetes type 2, well controlled.  8.  Dementia.   MEDICATIONS AT DISCHARGE:  1.  Donepezil 5 mg once a day.  2.  Januvia 100 mg once a day.  3.  Metformin 500 mg 2 tablets once a day.  4.  Metformin 500 mg 2 tablets once a day.  5.  Cardizem 180 mg  every 24 hours extended release.  6.  Proscar 5 mg once a day.  7.  Flomax 0.4 mg once a day.  8.  Metoprolol succinate 100 mg ER composition once a day.  9.  Simvastatin 20 mg at bedtime.  10.  Digoxin 125 mcg once a day.  11.  Citalopram 10 mg once a day.  12.  Lantus  units once a day.  13.  Namenda 5 mg twice daily.  14.  Meclizine 12.5 mg every 6 hours as needed for dizziness.  15.  Aspirin 81 mg once a day. This medication needs to be held for 2 weeks to prevent bleeding.  16.  Lisinopril 10 mg once a day.  17.  Protonix 40 mg once a day.   FOLLOWUP: Dr. Lucilla Lame just as needed if bleeding recurs, and Dr. Viviana Simpler in 1 to 2 weeks.   DIET: The patient is to be on bland diet for 6 more days and then changed to diverticular diet.   HOSPITAL COURSE: This is a very nice 81 year old gentleman who has history of atrial fibrillation, diabetes, hypertension, mild dementia, admitted to the hospital with bright red blood per rectum. He lives in [facility] with his wife, and he had 1 bloody bowel movement. Here in the hospital, he had multiple bowel movements that were mostly red or maroon. At first, he was able to sustain his hemoglobin very well.  His first hemoglobin was 13.6, then 13.3, then went down to 12.7, and then down to 10.6. His diet was kept as a clear liquid diet and advanced to soft diet on the 5th, but then on the 7th had drop of his hemoglobin, for which we put him put him back on clear liquid diet. At this moment, the patient is able to be discharged on a bland diet for 6 days. There is no significant bleeding at this moment. GI was consulted, and Dr. Allen Norris did not feel like there was a need to do any procedures at this moment, just monitor his hemoglobin. Since his hemoglobin has been stable, we are going to be able to discharge him and follow up outpatient.   The patient needs to stay on a bland diet, then change to diverticular diet in 6 days, and that includes no seeds, no nuts.    Overall, he has been stable. His atrial fibrillation has been rate controlled. The patient is scheduled to continue his home medications. His blood pressure has been also a little bit elevated, but could be for the stress of being in the hospital. No major changes in his medications have been done.  The patient has been told to hold his aspirin for 2 weeks before starting again to prevent any further bleeding. Other medical problems have been stable.   TIME SPENT: I spent about 45 minutes with this patient's discharge.    Follow up with Dr. Silvio Pate in 2 weeks.  ____________________________ Power Sink, MD rsg:dmm D: 11/30/2013 15:28:55 ET T: 11/30/2013 20:53:12 ET JOB#: [id number]  cc: [place] Sink, MD, <Dictator> Lareen Mullings America Brown MD ELECTRONICALLY SIGNED 12/03/2013 18:12

## 2015-04-15 NOTE — Consult Note (Signed)
Chief Complaint:  Subjective/Chief Complaint Pt deneis any further rectal bleeding, but has noticed dark purple stools.  Denies abdominal pain, nausea, or vomiting.   VITAL SIGNS/ANCILLARY NOTES: **Vital Signs.:   08-Dec-14 04:18  Vital Signs Type Routine  Temperature Temperature (F) 98.3  Celsius 36.8  Temperature Source oral  Pulse Pulse 57  Respirations Respirations 20  Systolic BP Systolic BP 616  Diastolic BP (mmHg) Diastolic BP (mmHg) 80  Mean BP 102  Pulse Ox % Pulse Ox % 97  Pulse Ox Activity Level  At rest  Oxygen Delivery Non-invasive ventilation (CPAP/BIPAP)    04:19  Vital Signs Type Sitting  Temperature Temperature (F) 98.3  Celsius 36.8  Temperature Source oral  Pulse Pulse 58  Systolic BP Systolic BP 073  Diastolic BP (mmHg) Diastolic BP (mmHg) 73  Mean BP 90  Pulse Ox % Pulse Ox % 97    04:20  Vital Signs Type Standing  Temperature Temperature (F) 98.3  Celsius 36.8  Temperature Source oral  Pulse Pulse 67  Systolic BP Systolic BP 710  Diastolic BP (mmHg) Diastolic BP (mmHg) 80  Mean BP 98  Pulse Ox % Pulse Ox % 97    08:03  Vital Signs Type Pre Medication  Pulse Pulse 58  Systolic BP Systolic BP 626  Diastolic BP (mmHg) Diastolic BP (mmHg) 80  Mean BP 108  Pulse Ox % Pulse Ox % 97  Pulse Ox Activity Level  At rest  Oxygen Delivery Room Air/ 21 %   Brief Assessment:  GEN well developed, well nourished, Pleasant & cooperative, A/Ox2   Cardiac Regular  Irregular   Respiratory normal resp effort   Gastrointestinal details normal Soft  Nontender  Nondistended   EXTR negative cyanosis/clubbing, negative edema   Additional Physical Exam Skin: pink, warm, dry   Lab Results: Routine Hem:  08-Dec-14 05:17   WBC (CBC) 7.0  RBC (CBC)  3.35  Hemoglobin (CBC)  11.1  Hematocrit (CBC)  31.5  Platelet Count (CBC) 157  MCV 94  MCH 33.1  MCHC 35.3  RDW 14.4  Neutrophil % 59.9  Lymphocyte % 25.0  Monocyte % 11.6  Eosinophil % 2.7  Basophil %  0.8  Neutrophil # 4.2  Lymphocyte # 1.7  Monocyte # 0.8  Eosinophil # 0.2  Basophil # 0.1 (Result(s) reported on 30 Nov 2013 at 06:49AM.)   Assessment/Plan:  Assessment/Plan:  Assessment Lower GI bleed:  Likely diverticular vs hemorrhoidal.  No further bleeding noted.  No intervention at this time. Anemia: Hgb trended down a bit, but overall stable   Plan 1) Continue to monitor for bleeding 2) continue supportive measures Please call if you have any questions or concerns   Electronic Signatures: Andria Meuse (NP)  (Signed 08-Dec-14 11:06)  Authored: Chief Complaint, VITAL SIGNS/ANCILLARY NOTES, Brief Assessment, Lab Results, Assessment/Plan   Last Updated: 08-Dec-14 11:06 by Andria Meuse (NP)

## 2015-04-15 NOTE — Consult Note (Signed)
Chief Complaint:  Subjective/Chief Complaint Patient with darker stools. Hb had dropped over the last 24 hours. No complaints except wanting to go home. No abd pain.   VITAL SIGNS/ANCILLARY NOTES: **Vital Signs.:   07-Dec-14 09:33  Vital Signs Type Pre Medication  Temperature Temperature (F) 98.8  Celsius 37.1  Temperature Source oral  Pulse Pulse 74  Respirations Respirations 20  Systolic BP Systolic BP 626  Diastolic BP (mmHg) Diastolic BP (mmHg) 80  Mean BP 103  Pulse Ox % Pulse Ox % 97  Pulse Ox Activity Level  At rest  Oxygen Delivery Room Air/ 21 %   Brief Assessment:  GEN well developed, well nourished, no acute distress   Respiratory normal resp effort  no use of accessory muscles   Gastrointestinal Normal   Gastrointestinal details normal Soft  Nontender   Lab Results: Routine Hem:  07-Dec-14 04:29   WBC (CBC) 7.1  RBC (CBC)  3.22  Hemoglobin (CBC)  10.6  Hematocrit (CBC)  30.2  Platelet Count (CBC)  139  MCV 94  MCH 33.0  MCHC 35.3  RDW 14.3  Neutrophil % 58.0  Lymphocyte % 28.0  Monocyte % 10.6  Eosinophil % 2.8  Basophil % 0.6  Neutrophil # 4.1  Lymphocyte # 2.0  Monocyte # 0.8  Eosinophil # 0.2  Basophil # 0.0 (Result(s) reported on 29 Nov 2013 at 05:09AM.)   Assessment/Plan:  Assessment/Plan:  Assessment Loer GI bleed.   Plan Less bleeding but patient had a drop in Hb. History of hemorrhoidal and diverticular bleeds in the past. If continue to drop Hb then a repeat colonoscopy will be considered. Will hold off on a colonoscopy at this time.   Electronic Signatures: Lucilla Lame (MD)  (Signed 07-Dec-14 12:03)  Authored: Chief Complaint, VITAL SIGNS/ANCILLARY NOTES, Brief Assessment, Lab Results, Assessment/Plan   Last Updated: 07-Dec-14 12:03 by Lucilla Lame (MD)

## 2015-04-16 NOTE — H&P (Signed)
PATIENT NAME:  Benjamin Grant, Benjamin Grant MR#:  [id number] DATE OF BIRTH:  [birth year]  DATE OF ADMISSION:  01/15/2014  PRIMARY CARE PHYSICIAN:  Dr. Silvio Pate.  REFERRING PHYSICIAN:  Dr. Beather Arbour.   CHIEF COMPLAINT:  Productive cough and chills for 3 to 4 days.   HISTORY OF PRESENT ILLNESS:  The patient is an 81 year old male with a known history of chronic atrial fibrillation, diabetes mellitus, hypertension and history of abdominal aortic aneurysm who resides at [facility] is brought in to the ER with a chief complaint of productive cough associated with chills for 3 to 4 days.  The patient has been coughing and bringing up a yellowish phlegm.  Has been having chills and rigors.  Denies any chest pain or shortness of breath.  The patient is a poor historian with memory deficits.   PAST MEDICAL HISTORY:  Chronic atrial fibrillation, diabetes mellitus, hypertension, abdominal aortic aneurysm, GI bleed in the past, abdominal distention with post obstructive renal failure and bladder outlet obstruction in the past.   PAST SURGICAL HISTORY:  Right knee replacement.   ALLERGIES:  PENICILLIN.   PSYCHOSOCIAL HISTORY:  He resides at [facility].  No smoking, alcohol or illicit drug usage.   FAMILY HISTORY:  Mother died of old age.  Father died in his 82s due to CVA.   HOME MEDICATIONS:  Tylenol 1000 mg by mouth every 12 hours, simvastatin 20 mg once daily, Robitussin as needed, Protonix 40 mg once daily, Proscar 5 mg by mouth once daily, Namenda 5 mg by mouth 2 times a day, metoprolol succinate 100 mg by mouth once daily, metformin 500 mg 2 tablets by mouth daily, Lantus 40 units subcutaneous once daily, Januvia 100 mg once daily, donepezil 5 mg by mouth once daily, digoxin 0.125 mg by mouth once daily,   LABORATORY AND IMAGING STUDIES:  Troponin less than 0.02.  WBC 7.0, hemoglobin 12.6, hematocrit 37.2, platelets 160, glucose 192, BUN 14, creatinine 0.98, sodium 135.  The rest of the  Chem-8 is normal.  Chest x-ray PA and lateral views has revealed left-sided air space disease most consistent with pneumonia, recommend radiographic follow-up , cardiomegaly and chronic interstitial thickening correlate with smoking history.     REVIEW OF SYSTEMS: CONSTITUTIONAL:  Denies any fever or fatigue, but complaining of chills and rigors.  EYES:  Denies blurry vision, double vision.  EARS, NOSE, THROAT:  Denies epistaxis or discharge.  RESPIRATION:  Complaining of cough which is productive in nature.  No COPD.  CARDIOVASCULAR:  No chest pain, palpitations.  GASTROINTESTINAL:  Denies nausea, vomiting, diarrhea.  GENITOURINARY:  No dysuria or hematuria.  ENDOCRINE:  Denies polyuria, nocturia.  Has diabetes mellitus.  HEMATOLOGIC AND LYMPHATIC:  No edema, easy bruising, bleeding.  INTEGUMENTARY:  No acne, rash, lesions.  MUSCULOSKELETAL:  No joint pain in the neck and back. NEUROLOGIC:  No vertigo or ataxia.  PSYCHIATRIC:  No ADD or OCD.  Lives in a memory unit.   PHYSICAL EXAMINATION:  VITAL SIGNS:  Temperature 97.9, pulse 69, respirations 20, blood pressure 153/74, pulse ox 91% on 2 liters. GENERAL APPEARANCE:  Not under acute distress.  Moderately built and nourished.  HEENT:  Normocephalic, atraumatic.  Pupils are equally reacting to light and accommodation.  No scleral icterus.  No conjunctival injection.  No sinus tenderness.  No postnasal drip.  NECK:  Supple.  No JVD.  No thyromegaly.  LUNGS:  Moderate air entry.  Positive rales and rhonchi on the left side.  CARDIOVASCULAR:  Irregularly irregular.  GASTROINTESTINAL:  Soft.  Bowel sounds are positive, obese, nontender, nondistended.  No hepatosplenomegaly.  No masses felt.  NEUROLOGIC:  Awake and alert, oriented x 1 to 2, following verbal commands.  Motor and sensory are intact.  Reflexes are 2+.  EXTREMITIES:  No edema.  No cyanosis.  No clubbing.   ASSESSMENT AND PLAN:  An 81 year old male with cough with yellowish phlegm  associated with shortness of breath, found to be hypoxic at 90% on room air will be admitted with the following assessment and plan.  1.  Acute left lower lobe pneumonia.  We will admit him to med/surg floor.  Blood cultures were obtained.  Sputum cultures were ordered.  The patient will be on IV antibiotics Levaquin and meropenem.  2.  History of hypertension.  We will resume his blood pressure medication once blood pressure is stable.. 3.  History of gastroesophageal reflux disease.  The patient will be on gastrointestinal prophylaxis with Protonix.  4.  Chronic history of atrial fibrillation.  The patient's heart rate is controlled at this time.  We will resume his home medications after med reconciliation.  5.  Diabetes mellitus.  The patient will be on sliding scale insulin and resume home meds.  6.  We will provide gastrointestinal and deep vein thrombosis prophylaxis.  7.  THE PATIENT'S CODE STATUS IS DO NOT RESUSCITATE.   Plan of care was discussed in detail with the patient.    Total time spent on admission is 45 minutes.     ____________________________ Nicholes Mango, MD ag:ea D: 01/15/2014 02:32:50 ET T: 01/15/2014 03:13:43 ET JOB#: [id number]  cc: Nicholes Mango, MD, <Dictator> Venia Carbon, MD  Nicholes Mango MD ELECTRONICALLY SIGNED 01/31/2014 1:59

## 2015-04-16 NOTE — Discharge Summary (Signed)
PATIENT NAME:  Benjamin Grant, Benjamin Grant MR#:  [id number] DATE OF BIRTH:  [birth year]  DATE OF ADMISSION:  01/15/2014 DATE OF DISCHARGE:  01/18/2014  DISCHARGE DIAGNOSES: 1. Pneumonia.  2. Hypertension.  3. Diabetes mellitus type 2.  4. Chronic atrial fibrillation.  5. Gastroesophageal reflux disease.  DISCHARGE MEDICATIONS:   1. Levaquin 750 mg daily for five days.  2. Prednisone 20 mg 3 tablets daily for three days, 2 tablets daily for two days, 1 tablet daily for two days.  3. The patient is on Lantus 14 units once a day.  4. Robitussin cough and cold   every four hours as needed.  5. Protonix 40 mg p.o. daily.  6. Aspirin 81 mg p.o. daily.  7. Namenda 5 mg p.o. b.i.d.  8. Celexa 10 mg p.o. daily.  9. Digoxin 125 mcg p.o. daily.  10. Simvastatin 20 mg p.o. daily.  11. Metoprolol succinate 100 mg once a day.  12. Proscar 5 mg p.o. daily. 13. Flomax 0.4 mg p.o. daily.  14. Cardizem CD 180 mg 1 capsule in the morning.  15. Metformin 500 mg 2 tablets in the morning.  16. Januvia 100 mg p.o. daily.  17. Aricept 5 mg p.o. daily.  18. The patient is also on multivitamins and also Tylenol as needed.   CONSULTATIONS: None.  CODE STATUS: DO NOT RESUSCITATE.  HOSPITAL COURSE: 1. Pneumonia. This is an 81 year old male patient with multiple medical problems of hypertension, diabetes, atrial fibrillation came in from [facility]  unit because of shortness of breath and cough. The patient had chills and cough for about 3 to 4 days. Look at the history and physical for full details. The patient had O2 sats of 90% on room air and chest x-ray was concerning for pneumonia, so he was admitted for pneumonia and started on IV Levaquin and meropenem. His blood cultures and sputum cultures were obtained. The patient also started on IV steroids because he had lots of wheezing yesterday.  The patient's white count was not elevated on admission but he did have symptoms. Blood cultures are negative  and chest x-ray showed left-sided pneumonia on admission. Symptoms nicely improved. The patient does not have any more wheezing now and his breathing is better.  His oxygen saturations are 93% on room air. The sats are better. His lungs are better. The patient can go back to [facility] with Levaquin and prednisone.  2. The patient's other diagnoses include diabetes mellitus, type II. The patient is on metformin, Lantus and Januvia and sugars have been around 250s due to prednisone. The patient's Levemir can be increased to 16 units if necessary to keep the sugars controlled.   3. Hypertension and chronic atrial fibrillation. He is on Cardizem and digoxin and heart rate has been around 58 to 60, so he can continue those medications.  4. Blood cultures have been negative and the patient not able to get any sputum out to get the sputum cultures. I stopped the meropenem after 24 hours, just continued on Levaquin.  Marland Kitchen He can be discharged back. Follow up with Dr. Silvio Pate in about a week or 10 days.   TIME SPENT ON DISCHARGE PREPARATION: More than 30 minutes.   CODE STATUS; DO NOT RESUSCITATE   ____________________________ Epifanio Lesches, MD sk:sg D: 01/18/2014 13:16:54 ET T: 01/18/2014 14:11:33 ET JOB#: [id number]  cc: Epifanio Lesches, MD, <Dictator> Epifanio Lesches MD ELECTRONICALLY SIGNED 02/01/2014 12:51

## 2015-04-17 NOTE — Discharge Summary (Signed)
PATIENT NAME:  Benjamin Grant, VELDHUIZEN MR#:  [id number] DATE OF BIRTH:  February 03, 1930  DATE OF ADMISSION:  01/10/2012 DATE OF DISCHARGE:  01/15/2012  Please refer to my previous discharge summary dated 01/13/2012. The discharge was withheld 01/13/2012 because plan was to send him to skilled nursing part of [facility]. Now he received a PASARR today and he is going to skilled nursing part of [place]. His hemoglobin has remained stable in the range of 10.6 today, his potassium is stable at 3.7. He is hemodynamically stable. He is off Coumadin right now and his left chest wall and abdominal wall hematoma is resolving.   MEDICATIONS AT DISCHARGE/HOME MEDICATIONS:  1. Saw palmetto 450 mg twice a day. 2. Pepcid 20 mg as needed. 3. Victoza 1.8 mg subcutaneously once a day. 4. Digoxin 0.125 mg daily.  5. Namenda 10 mg b.i.d.  6. Donepezil 5 mg at bedtime. 7. Clindamycin 150 mg 4 capsules one hour prior to dental procedure. 8. Colace 100 mg at bedtime. 9. Gabapentin 100 mg at bedtime.  10. Multivitamin daily.  11. Janumet 50/1000 mg twice a day. 12. Meclizine 12.5, 1 to 2 tabs q.6 hours as needed.  13. Metoprolol ER 100 mg daily.  14. WelChol 625 mg 3 tabs twice a day. 15. Simvastatin 40 mg at bedtime.  16. Acetaminophen 500 mg p.o. every 4 to 6 hourly as needed for pain. 17. Change oxycodone 5 mg 1 tab q.4-6 hours as needed.  18. Do not take Coumadin, Motrin or Aleve. Do not take quinapril, hydrochlorothiazide.   CONDITION AT DISCHARGE: He is comfortable.   PHYSICAL EXAMINATION: T-max 98.1, heart rate ranging from 81 to 98, blood pressure 113/78, saturating 97% on room air. Chest is clear. Heart sounds are irregular. He has significant ecchymosis of his left chest and abdominal wall which is improving. Abdomen soft and nontender.  DIET: Advised a low sodium, ADA, low cholesterol diet.   FOLLOW UP: Follow up with Dr. Rosanna Randy in one week. Follow up hemoglobin at Dr. Alben Spittle office. I explained to  the patient and the family that he needs to have a discussion with Dr. Rosanna Randy regarding restarting Coumadin once his hematoma resolves. If he continues to fall he is a high risk for Coumadin but currently all his Coumadin has been stopped right now because of significant hematoma on his abdominal wall.   TIME SPENT WITH DISCHARGE: 40 minutes.   ____________________________ Mena Pauls, MD ag:cms D: 01/15/2012 13:08:58 ET T: 01/15/2012 13:35:59 ET JOB#: [id number]  cc: Mena Pauls, MD, <Dictator> Richard L. Rosanna Randy, MD Mena Pauls MD ELECTRONICALLY SIGNED 02/14/2012 13:44

## 2015-04-17 NOTE — Consult Note (Signed)
Brief Urology Procedure Note: Complicated Foley Catheter PlacementNon-functioning, displaced foley catheter Clinical Note: 81 y.o WM with Chronic A-Fib, Type II DM, HTN, Dementia s/p recent [facility] Hospitalization from 02/29/2012 - 03/04/2012 for Urinary Retention with obstructive uropathy, Enterococcal UTI. Good clinical response to foley catheter decompression. Pt represents to the [facility] ER from [facility] with Abd Pain/Distention. CT revealed the foley displaced into the prostatic fossa with marked bladder distention and b/L hydro. In Detail: existing foley catheter was removed without difficulty.pt was then prepped/draped sterilely. Local anesthesia/urethral distention achieved with a 34mL 2% Lidocaine Jelly Urojet.37 Fr Coude Foley catheter was placed to straight drainage with the balloon inflated with 18mL sterile water. Prompt return of >1364mL of malodorous, apricot-colored urine obtained with relief of abd distention/discomfort.  Maintain foley catheter decompression.Pt is at risk for post-obstrcutive diuresis - maintain 2 pitchers of cool water at the bedside for prn intake and monitor hemodynamics closely.Urine for UA/C&S. Jacqlyn Larsen will be covering beginning at 5am on 03/10/2012.  Electronic Signatures: Darcella Cheshire (MD)  (Signed on 17-Mar-13 16:47)  Authored  Last Updated: 17-Mar-13 16:47 by Darcella Cheshire (MD)

## 2015-04-17 NOTE — H&P (Signed)
PATIENT NAME:  Benjamin Grant, Benjamin Grant MR#:  [id number] DATE OF BIRTH:  October 30, 1930  DATE OF ADMISSION:  03/09/2012  PRIMARY CARE PHYSICIAN: Dr. Rosanna Randy   NOTE: Patient has been in the Emergency Room for well over 10 hour prior to medical team being asked to admit.   CHIEF COMPLAINT: Abdominal pain.   HISTORY OF PRESENT ILLNESS: This is a 81 year old man who was recently in the hospital for abdominal distention. He was sent home with a Foley catheter for urinary tract infection, bladder outlet obstruction, also had acute renal failure. The patient developed acute abdominal pain. He woke up with it, 7/10 intensity. It plagued him all day. He had decreased urine output, 300 mL over seven hours which was bloody. He was seen in consultation by Dr. Maudie Mercury, urology, who did not believe this was a bladder perforation but a malposition, nonfunctioning Foley catheter. The catheter was changed. He recommended following ins and outs and sending off a urinalysis and urine culture. Hospitalist services were then contacted for further evaluation.   PAST MEDICAL HISTORY:  1. Atrial fibrillation. 2. Diabetes. 3. Hypertension. 4. Abdominal aortic aneurysm.  5. Gastroesophageal bleeding in the past. 6. Abdominal distention with postobstructive acute renal failure and bladder outlet obstruction.   PAST SURGICAL HISTORY: Right knee replacement.   ALLERGIES: Penicillin.   CURRENT MEDICATIONS:  1. Gabapentin 100 mg daily.  2. Colace 100 mg daily.  3. Donepezil 5 mg daily.  4. Famotidine 20 mg daily.  5. Simvastatin 20 mg daily.  6. Flomax 0.4 mg daily.  7. Proscar 5 mg daily.  8. Cardizem CD 180 mg daily.  9. Aspirin 81 mg daily.  10. Multivitamin daily.  11. Toprol 100 mg daily.  12. Digoxin 0.125 mg p.o. daily.  13. WelChol 625 mg, 3 tablets twice a day.  14. Janumet 50/1000, 1 tablet b.i.d.  15. Namenda 10 mg p.o. b.i.d.  16. Victoza 1.8 mg subcutaneous daily. 17. Saw palmetto b.i.d.   SOCIAL HISTORY:  States that he is at [facility] assisted living. No smoking. No alcohol. No drug use. Lives with his wife. 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   FAMILY HISTORY: Mother died at 3-1/2 of old age. Father died at 22 of a cerebrovascular accident.   REVIEW OF SYSTEMS: CONSTITUTIONAL: Positive for cold sweats. No fever. No chills. Positive for weight loss. Positive for fatigue. EYES: He does wear glasses. EARS, NOSE, MOUTH, AND THROAT: Positive for hearing aid. No sore throat. No difficulty swallowing. CARDIOVASCULAR: No chest pain. No palpitations. RESPIRATORY: No shortness of breath. No coughing. No sputum. No hemoptysis. GASTROINTESTINAL: Positive for constipation. No nausea. No vomiting. Positive for abdominal pain. No bright red blood per rectum. No melena. GENITOURINARY: Had a catheter in with decreased urine output and bloody output. MUSCULOSKELETAL: No joint pain. NEUROLOGIC: Previous admission, had a blackout prior. PSYCHIATRIC: No anxiety or depression. ENDOCRINE: No thyroid problems. HEMATOLOGIC/LYMPHATIC: No anemia.   PHYSICAL EXAMINATION:  VITAL SIGNS: Last vital signs include: Temperature 97, pulse 127, respirations 16, blood pressure 150/88, pulse oximetry 96% on room air.   GENERAL: No respiratory distress.   EYES: Conjunctivae and lids normal. Pupils equal, round, and reactive to light. Extraocular muscles intact. No nystagmus.   EARS, NOSE, MOUTH, AND THROAT: Tympanic membranes no erythema. Nasal mucosa no erythema. Throat no erythema. No exudate seen. Lips and gums no lesions.   NECK: No JVD. No bruits. No lymphadenopathy. No thyromegaly. No thyroid nodules palpated.   RESPIRATORY: Lungs  clear to auscultation. No use of accessory muscles to breathe. No rhonchi, rales, or wheeze heard.   CARDIOVASCULAR: S1, S2 normal. Tachycardic, irregularly irregular. No gallops, rubs, or murmurs heard. Carotid upstroke 2+ bilaterally. No bruits.   EXTREMITIES:  Dorsalis pedis pulses 2+ bilaterally. 2+ edema of the lower extremity.   ABDOMEN: Soft. Positive tenderness in the lower abdominal area. No organomegaly/splenomegaly. Normoactive bowel sounds.   LYMPHATIC: No lymph nodes in the neck or groin.   MUSCULOSKELETAL: 2+ edema. No clubbing. No cyanosis.   SKIN: Chronic lower extremity discoloration, right greater than the left lower extremity.   NEUROLOGICAL: Cranial nerves II through XII grossly intact. Deep tendon reflexes 1+ bilateral lower extremities.   PSYCHIATRIC: Patient is oriented to person and place.   LABORATORY, DIAGNOSTIC AND RADIOLOGICAL DATA: Urinalysis 3+ bacteria, 3+ leukocyte esterase. Troponin negative. Lipase 148, white blood cell count 14.0, hemoglobin and hematocrit 14.7 and 43.7, platelet count 182, glucose 194, BUN 15, creatinine 0.95, sodium 135, potassium 4.1, chloride 103, CO2 26, calcium 9.1. Liver function tests: Albumin low at 2.8. Other liver function tests normal range. CT scan of the abdomen and pelvis showed bladder severely distended, few locules of air which appear to be within versus outside the bladder wall concerning for bladder wall injury, bladder wall defect concerning for perforation. Recommend urology consultation. Malposition Foley catheter. Balloon inflated within the prostatic urethra. Moderate bilateral hydronephrosis secondary to severe bladder distention versus distal stricture. EKG shows atrial fibrillation, left axis, low voltage Q waves inferiorly.   ASSESSMENT AND PLAN:  1. Abdominal pain. On CT scan showed questionable bladder perforation. Urology felt that was just in a malpositioned catheter. Will send off a urinalysis, urine culture. Empiric Levaquin for right now. Appreciate urology consultation. Will give IV fluids. Strict ins and outs. Continue to monitor closely. Pain is better after Foley catheter changed.  2. Rapid atrial fibrillation. Will give a stat dose of Toprol-XL. Unclear whether he  got his morning medications or not. Will put on telemetry.  3. Hypertension. Continue his usual medications for right now.  4. Diabetes. Hold metformin at this point with CAT scan. Continue Janumet and Victoza.   5. Benign prostatic hypertrophy with bladder outlet obstruction. Needs Foley and treatment for benign prostatic hypertrophy with Proscar and Flomax.  6. Dementia. On Aricept and Namenda.  7. Hyperlipidemia. Continue Zocor and can continue WelChol as outpatient.   TIME SPENT ON ADMISSION: 55 minutes.   CODE STATUS: Patient is a DO NOT RESUSCITATE.   ____________________________ Tana Conch. Leslye Peer, MD rjw:cms D: 03/09/2012 17:54:54 ET T: 03/10/2012 07:10:11 ET JOB#: [id number]  cc: Tana Conch. Leslye Peer, MD, <Dictator> Milca Sytsma L. Rosanna Randy, MD Marisue Brooklyn MD ELECTRONICALLY SIGNED 03/14/2012 17:45

## 2015-04-17 NOTE — Discharge Summary (Signed)
PATIENT NAME:  Benjamin Grant, Benjamin Grant MR#:  [id number] DATE OF BIRTH:  Jun 16, 1930  DATE OF ADMISSION:  03/09/2012 DATE OF DISCHARGE:  03/11/2012   DISCHARGE DIAGNOSES:  1. Obstructive uropathy/Urinary retention with reinsertion of Foley catheter. 2. Urinary tract infection.  3. Atrial fibrillation with rapid ventricular response, improved. 4. Diabetes. 5. Hypertension.  6. Hyperlipidemia.  7. Dementia. 8. Gastroesophageal reflux disease.  CONSULT: Urology, Dr. Delma Officer    HOSPITAL COURSE: This is a 81 year old male who has history of chronic atrial fibrillation, diabetes, hypertension, and hyperlipidemia. The patient was recently discharged. The patient had bladder outlet obstruction, obstructive uropathy, and he was discharged with an indwelling Foley catheter recently. He presented with severe abdominal pain and found to have obstructive uropathy with a malpositioned Foley catheter. His CT of the abdomen and pelvis when he came in showed that the patient had severely distended bladder, malpositioned Foley catheter with the balloon inflated within the prostatic urethra and moderate bilateral hydronephrosis. Urology saw the patient, Dr. Maudie Mercury, in the Emergency Room and so Foley catheter was replaced and prompt return of greater than 1300 mL of malodorous urine was obtained with relief of abdominal distention. Discussed with Urology, Dr. Jacqlyn Larsen, at discharge. The patient is to have the Foley catheter in for about 10 days and then he needs to follow-up with Urology as outpatient for a voiding trial. He is already on Proscar and he is already on Flomax. His urinalysis showed he had significant pyuria with 3+ leukocyte esterase and 320 WBCs per high-power field. Urine culture actually showed colonies too small to read. Blood cultures are negative. His white count when he came in was elevated in the range of 14,000 but his white count has significantly improved to 10.8 today on Levaquin. We will discharge him on  Levaquin. He had a recent urine culture done on March 8th that showed Enterococcus faecalis sensitive to Levaquin. Will give 8 days of Levaquin to complete 10 days of antibiotics.   He also was admitted to ICU because when he came in he had rapid atrial fibrillation. - (Dictation Cut Off Here)  ____________________________ Mena Pauls, MD ag:drc D: 03/11/2012 10:36:46 ET T: 03/11/2012 11:12:38 ET JOB#: [id number]  cc: Mena Pauls, MD, <Dictator> Mena Pauls MD ELECTRONICALLY SIGNED 03/18/2012 15:29

## 2015-04-17 NOTE — Discharge Summary (Signed)
PATIENT NAME:  Benjamin Grant, Benjamin Grant MR#:  [id number] DATE OF BIRTH:  May 28, 1930  DATE OF ADMISSION:  03/09/2012 DATE OF DISCHARGE:  03/11/2012   ADDENDUM:  He also had rapid atrial fibrillation when he came to the Emergency Room yesterday. His heart rate jumped up to 145. He had to be given Cardizem drip for a short duration. He is already on Toprol 100 mg daily, digoxin 0.125 mg daily. He has therapeutic digoxin levels. He was weaned off the Cardizem drip yesterday and he was put back on his Cardizem 180 mg daily. His heart rate is well controlled right now. His digoxin level is 1.08. His cardiac enzymes are negative. His creatinine is stable at 1. As stated previously, his blood cultures are negative.   MEDICATIONS AT DISCHARGE:  1. Multivitamin 1 daily. 2. Metoprolol ER 100 mg daily.  3. Digoxin 0.125 mg daily.  4. WelChol 625 mg 3 tablets twice a day. 5. Janumet 50/1000 mg twice a day.  6. Namenda 10 mg b.i.d.  7. Victoza 1.8 mg (0.3 mL) subcutaneously once a day.  8. Saw palmetto 1 tablet twice a day. 9. Gabapentin 100 mg daily.  10. Colace 100 mg at bedtime. 11. Donepezil 5 mg once a day at bedtime.  12. Oxycodone 5 mg q.4 hours p.r.n. for severe pain. 13. Tylenol one tablet q.4 to 6 hours as needed for pain. 14. Meclizine 12.5 mg 1 to 2 tablets q.6 hours p.r.n. for dizziness. 15. Famotidine 20 mg daily as needed for GI upset.  16. Flomax 0.4 mg daily.  17. Proscar 5 mg daily.  18. Cardizem CD 180 mg daily. 19. Aspirin 81 mg daily. 20. Simvastatin 20 mg daily.   NEW MEDICATION: Levaquin 500 mg p.o. daily for seven days.  Keep Foley catheter in until follow-up appointment with Urology.  DIET: Advised a low sodium, ADA, low cholesterol diet.   CONDITION AT DISCHARGE: He is comfortable. T-max 97.9, heart rate 77, blood pressure 132/78, saturating 95% on room air. CHEST is clear. HEART sounds are irregular. ABDOMEN soft, nontender. He has a new Foley catheter in. He will be  discharged with an indwelling Foley catheter because of obstructive uropathy.   FOLLOW-UP:  1. The patient should follow-up with Dr. Rosanna Randy in one week. 2. Follow-up CBC and BMP in 2 to 3 days to monitor his white count and electrolytes.  3. Follow-up with Urology, Dr. Bernardo Heater or Dr. Jacqlyn Larsen, in 10 days for obstructive uropathy and voiding trial.   TIME SPENT WITH DISCHARGE: 50 minutes.  ____________________________ Mena Pauls, MD ag:drc D: 03/11/2012 10:32:23 ET T: 03/11/2012 10:52:38 ET JOB#: [id number]  cc: Mena Pauls, MD, <Dictator> Richard L. Rosanna Randy, MD [place]. Bernardo Heater, MD Denice Bors Jacqlyn Larsen, MD Mena Pauls MD ELECTRONICALLY SIGNED 03/18/2012 15:28

## 2015-04-17 NOTE — Op Note (Signed)
PATIENT NAME:  Benjamin Grant, Benjamin Grant MR#:  [id number] DATE OF BIRTH:  December 17, 1930  DATE OF PROCEDURE:  06/11/2012  PREOPERATIVE DIAGNOSES:  1. Benign prostatic hypertrophy.  2. Urinary retention.   POSTOPERATIVE DIAGNOSES:  1. Benign prostatic hypertrophy.  2. Urinary retention.   PROCEDURE: Photoselective vaporization of the prostate.   SURGEON: Raney Koeppen C. Bernardo Heater, MD  ASSISTANT: None.   ANESTHESIA: General.   INDICATIONS: 81 year old male with a several month history of urinary retention. He has failed multiple voiding trials. He is currently on both tamsulosin and finasteride. After discussion of treatment options, he has elected outlet surgery.   DESCRIPTION OF PROCEDURE: Patient was taken to the Operating Room where a general anesthetic was administered. He was placed in the low lithotomy position. His Foley catheter was removed and the external genitalia were prepped and draped in the usual fashion. Time out was performed per protocol. A 22 French continuous flow laser cystoscope was lubricated and passed under direct vision. The urethra was normal in caliber without stricture. The prostate demonstrates touching lateral lobes. There was no median lobe tissue present. There was mild bladder neck elevation. Prostatic length was approximately 4 cm. Bladder mucosa was inspected and demonstrates no papillary or solid lesions. There were inflammatory changes of the bladder base consistent with his indwelling Foley catheter. Ureteral orifices were normal appearing and well away from the bladder neck. A KTP laser fiber was placed through the cystoscope. Beginning at a power setting of 80 watts the left lateral lobe was vaporized. The power was subsequently increased to 180 watts. Lateral lobe was vaporized from the bladder neck to the verumontanum. The mild bladder neck elevation was also vaporized. Attention was then directed to the right lateral lobe which was vaporized in a similar fashion. At  completion of the procedure, with the cystoscope at the verumontanum, the channel was open. No significant bleeding was noted. Total energy used was 387,053 joules. Total laser time was 43 minutes and 48 seconds. Cystoscope was removed. A 20 French Foley catheter was placed without difficulty. Catheter was irrigated and the irrigant was slightly blood tinged. Foley was placed to gravity drainage. B and O suppository was placed per rectum. Patient was taken to PAC-U in stable condition. There were no complications. EBL minimal.   ____________________________ Ronda Fairly. Bernardo Heater, MD scs:cms D: 06/11/2012 11:12:17 ET T: 06/11/2012 11:20:57 ET JOB#: [id number]  cc: Nicki Reaper C. Bernardo Heater, MD, <Dictator> Abbie Sons MD ELECTRONICALLY SIGNED 07/07/2012 9:09

## 2015-04-17 NOTE — Op Note (Signed)
PATIENT NAME:  Benjamin Grant, Benjamin Grant MR#:  [id number] DATE OF BIRTH:  1930/08/05  DATE OF PROCEDURE:  06/11/2012  PREOPERATIVE DIAGNOSES:  1. Benign prostatic hypertrophy.  2. Urinary retention.   POSTOPERATIVE DIAGNOSES:  1. Benign prostatic hypertrophy.  2. Urinary retention.   PROCEDURE: Photoselective vaporization of the prostate.   SURGEON: Yajahira Tison C. Bernardo Heater, MD  ASSISTANT: None.   ANESTHESIA: General.   INDICATIONS: 81 year old male with several month history of urinary retention. He has failed multiple voiding trials. He is on both tamsulosin and finasteride. After discussion of treatment options he desires to proceed with outlet surgery.   DESCRIPTION OF PROCEDURE: The patient was taken to the Operating Room where a general anesthetic was administered. His Foley catheter was removed and external genitalia were prepped and draped in the usual fashion. <<MISSING TEXT>> (dictation cut off here)  ____________________________ Ronda Fairly. Bernardo Heater, MD scs:cms D: 06/11/2012 11:07:29 ET T: 06/11/2012 11:14:37 ET JOB#: [id number]  cc: Nicki Reaper C. Bernardo Heater, MD, <Dictator>

## 2015-04-17 NOTE — Consult Note (Signed)
CC: abdominal distention81 yo male with one week history of abdominal distention and decreased urine output.  Presented to ED with ARF and CT scan revealed distended bladder with bilateral hydronephrosis.  Foley catheter placed uneventfully and urology consulted. reports 1-2 week history of difficulty with voiding.  No fevers, chills, n/v.  Denies hematuria or dysuria. Patient reports that he had a similar episode of urinary retention a few years ago.  He has seen a urologist but cannot remember the physician's name.  1. Diabetes mellitushypertension hyperlipidemiaatrial fibrillation (off coumadin for h/o bleeding)  DiverticulitisAAADementiaarthritis s/p right TKA  1. Digoxin 125 mcg daily.  2. Colace 1 capsule daily.  Donepezil 5 mg daily.  Famotidine 20 mg p.r.n.  Flagyl 500 mg was started on 02/25/2012 and stop date was supposed to be 03/03/2012.   Gabapentin 100 mg daily.  Janumet 50/1000 mg 1 tab 2 times a day.  Meclizine 12.5 mg every 6 hours as needed for dizziness.   Metoprolol succinate 100 mg daily.  Multivitamin daily.  Namenda 10 mg b.i.d.  Oxycodone 5 mg every 4 hours as needed for pain.  Saw Palmetto 450 mg 2 times a day.  Simvastatin 40 mg daily.  Tylenol 500 mg every 4 to 6 hours as needed for pain.   Victoza 1.8 mg once a day subcutaneous.   WelChol 625 mg 3 tabs twice a day. PCN history:  Lives at [facility].  No tobacco (quit 10 years ago); denies ETOHhistory: [employer] ROS: CONSTITUTIONAL: Denies fever, fatigue, has generalized weakness. No weight changes. EYES: Denies blurry vision or double vision. ENT: No tinnitus, hearing loss, or snoring. RESPIRATORY: Denies cough, wheezing, shortness of breath or chronic obstructive pulmonary disease. CARDIOVASCULAR: Denies chest pain, orthopnea, or swellings in the legs. He has a history of atrial fibrillation and hypertension. GASTROINTESTINAL: Has abdominal distention which is better now. No nausea or vomiting. Has some constipation. No hematemesis or  black tarry stools or blood per rectum. GENITOURINARY: Denies dysuria. Has history of urinary retention in the past. Denies incontinence or frequency. ENDOCRINE: Denies polyuria, nocturia, or thyroid problems. HEME/LYMPH: Denies anemia, had easy bruising prior. No bleeding since stopping coumadin. SKIN: Denies rash.  MUSCULOSKELETAL: Has chronic arthritis. NEUROLOGIC: Denies numbness, weakness.  Has dementia. PSYCHIATRIC: Denies insomnia depression.   Temperature  Temperature Temperature (F) : 97.5   Celsius : 36.3 degrees C  Pulse Pulse : 132  Respirations : 20   Systolic BP Systolic BP : 528  Diastolic BP (mmHg) Diastolic BP (mmHg) : 69   Mean BP : 84 mm Hg Ox % Pulse Ox % : 96 Ox Activity Level  : At restDelivery : Room Air/ 21 % NADnormal unlabored breathingirregular rate and rhythm, tachysoft, NT/ND foley draining clear urine smooth prostate >40gramsno edema 1.38 (from 2.89)11 (from 14)+leuk esterase >500 WBC  PROCEDURE: CT  - CT ABDOMEN AND PELVIS W0  - Feb 29 2012  2:13PM  CT of the abdomen and pelvis is performed with oral contrast only reconstructed at 3.0 mm slice thickness in the axial plane. is made to previous abdominal and pelvic CT dated 14 April  hydronephrosis and hydroureters demonstrated. The urinary is significantly distended consistent with urinary retention. A filled left inguinal hernia is present with a smaller fat filled inguinal hernia. Perinephric stranding is present bilaterally and  more prominent on the right. Stable low-attenuation is seen in right lobe of the liver suggestive of a probable cyst with a similar in the left lobe of the liver. These  are on images 42 and 26 with a smaller area anteriorly in the right lobe of the on image 43. The gallbladder is nondistended. No definite stones are seen. No discrete renal mass is evident. Aorto stent graft appears present. Pancreas and adrenal glands appear No abnormal gastric, small bowel or colonic distention is seen. bony  structures show some degenerative change. Artifact is present density over the left sacroiliac region just anterior to the joint which is of uncertain etiology and significance. The suggests numerous endovascular coils in the region. lung bases showed minimal dependent atelectasis. Urinary retention with a large amount of urine in the bladder and hydronephrosis. Urinary catheterization is suggested. Correlate urinalysis to evaluate for urinary tract infection given the stranding.Stable present hepatic cysts. These are not fully assessed with a exam.Aorto iliac stent graft. Density in the internal iliac region on the suggests previous possible coils.Diverticulosis without evidence of acute diverticulitis or perforation.Fat filled inguinal hernia on the left larger than a small hernia on right. yo male with urinary retention and probable bladder outlet obstructionContinue foley catheter for one week before trial of voidStart tamsulosin and finasteride for BOOf/u cultures and treat for complicated UTI 43. Schedule f/u with local urologist      Electronic Signatures: Margy Clarks (MD) (Signed on 09-Mar-13 11:10)  Authored   Last Updated: 09-Mar-13 11:19 by Margy Clarks (MD)

## 2015-04-17 NOTE — H&P (Signed)
PATIENT NAME:  Benjamin Grant, Benjamin Grant MR#:  [id number] DATE OF BIRTH:  10-16-1930  DATE OF ADMISSION:  01/10/2012  PRIMARY CARE PHYSICIAN:  Dr. Miguel Aschoff   CHIEF COMPLAINT: Back soreness with bruising.   HISTORY OF PRESENT ILLNESS: This is a 81 year old care home patient who came in after a recent fall. He has had bruising on his back. He was brought here and it was found that his INR was greatly elevated. He is on Coumadin for atrial fibrillation. A CT scan showed he had a hematoma in the left rear portion of his chest and lower back. His hemoglobin appears to be stable but his INR is greater than 10 so we are going to admit him for further treatment.   PAST MEDICAL HISTORY:  1. Atrial fibrillation.        a.) On chronic anticoagulation.  2. Non-insulin-dependent diabetes.  3. Hypertension.  4. History of abdominal aortic aneurysm followed by Dr. Delana Meyer.  5. History of GI bleeding.   PAST SURGICAL HISTORY: Right total knee replacement.   ALLERGIES: Penicillin.   CURRENT MEDICATIONS:  1. Gabapentin 100 mg at bedtime. 2. Victoza 1.8 mg subcutaneous q.a.m.  3. Janumet 50/1000 mg b.i.d.  4. Quinapril/HCTZ 20/12.5 mg daily.  5. Metoprolol XL 100 mg daily.  6. WelChol 625 mg, 3 tabs b.i.d.  7. Simvastatin 40 mg at bedtime.  8. Digoxin 125 mcg daily.  9. Multivitamin daily.  10. Saw palmetto 450 mg b.i.d.  11. Colace 100 mg at bedtime.  12. Namenda 10 mg b.i.d.  13. Donepezil 5 mg at bedtime.  14. Pepcid 20 mg p.r.n.  15. Meclizine 12.5 mg p.r.n.  16. Clindamycin 250 mg 4 tabs one hour before dental procedures p.r.n.  17. Coumadin 4 mg daily.  18. Oxycodone/acetaminophen 5/325, 1 to 2 q. 6 p.r.n.   SOCIAL HISTORY: He lives at a care home, stopped smoking about 10 years ago.   FAMILY HISTORY: Significant for cerebrovascular accident.   REVIEW OF SYSTEMS: CONSTITUTIONAL: No fever or chills. EYES: No blurred vision. ENT: No hearing loss. CARDIOVASCULAR: No chest pain.  PULMONARY: No shortness of breath. GI: No nausea, vomiting, or diarrhea. GU: No dysuria. ENDOCRINE:  No heat or cold intolerance. INTEGUMENT: No rash. MUSCULOSKELETAL: Back pain. GU: No dysuria.  ENDOCRINE: No heat or cold intolerance. INTEGUMENT: No rash. MUSCULOSKELETAL: Occasional joint pain. NEUROLOGIC: No numbness or weakness.   PHYSICAL EXAMINATION:  VITAL SIGNS: Temperature 97.8, pulse 112, respirations 20, blood pressure 127/61.   GENERAL: This is a well-nourished white male in no acute distress.   HEENT: The pupils are equal, round, and reactive to light. Sclerae are anicteric. Oral mucosa is moist. Oropharynx is clear. Nasopharynx is clear.  NECK: Supple. No JVD, lymphadenopathy, or thyromegaly.   CARDIOVASCULAR: Irregularly irregular with no murmurs.   LUNGS: Clear to auscultation. No dullness to percussion. He is not using accessory muscles.   ABDOMEN: Soft, nontender, nondistended. Bowel sounds are positive. No hepatosplenomegaly. No masses.   EXTREMITIES: No edema. No joint deformity.   NEUROLOGIC:  Cranial nerves II through XII are intact. He is alert and oriented times four.   SKIN: There is a large hematoma on the left scapula area that is palpable. There is bruising all over the left side of his back, upper and lower.  CT scan of the chest, abdomen, and pelvis shows a hematoma in the left posterior lateral chest wall measuring 8.4 x 3.3 cm.   LABORATORY, DIAGNOSTIC, AND RADIOLOGICAL DATA: White blood cells 18,  hemoglobin 12.9, BUN 32?, creatinine 1.3, INR 10.2.   ASSESSMENT AND PLAN:  1. Coagulopathy: I am not sure what has elevated his INR. He denies being on any recent antibiotics or any other medication changes. He has had poor p.o. intake, though, which may have reduced his vitamin K consumption. We are obviously going to hold his Coumadin. We are going to give him fresh frozen plasma and some vitamin K and follow his INR until it gets down to a more reasonable  range. We will also follow his hemoglobin for any further signs of bleeding.  2. Hematoma:  Again, we are going to follow his hemoglobin to see if he has any further bleeding. Monitor this for resolution. He should resorb this over a period of time.  3. Atrial fibrillation: We are going to hold his anticoagulation, continue his rate control drugs. His heart rate is a little bit elevated now; he has not had his evening dosage yet. We will adjust if necessary.  4. Hypertension: Appears to be controlled with his current medications.   TIME SPENT ON ADMISSION: 60 minutes.  ____________________________ Baxter Hire, MD [id number]:bjt D: 01/10/2012 22:02:24 ET T: 01/11/2012 07:04:30 ET JOB#: [id number]  cc: Baxter Hire, MD, <Dictator> Richard L. Rosanna Randy, MD Baxter Hire MD ELECTRONICALLY SIGNED 01/14/2012 9:07

## 2015-04-17 NOTE — Discharge Summary (Signed)
PATIENT NAME:  Benjamin Grant, Benjamin Grant MR#:  [id number] DATE OF BIRTH:  05-02-1930  DATE OF ADMISSION:  01/10/2012 DATE OF DISCHARGE:  01/13/2012  ADDENDUM  Patient was getting a very high dose of oxycodone 15 mg q.3 hours as needed at [facility]. I have decreased his oxycodone to 5 mg 1 tablet p.o. q.4-6 hours as needed for pain instead of the 15 mg of oxycodone. This also may be contributing to his fall. Also acetaminophen 500 mg p.o. q.4-6 hours as needed for pain.   ____________________________ Mena Pauls, MD ag:cms D: 01/13/2012 14:04:25 ET T: 01/13/2012 14:31:11 ET JOB#: [id number]  cc: Mena Pauls, MD, <Dictator> Richard L. Rosanna Randy, MD Mena Pauls MD ELECTRONICALLY SIGNED 02/14/2012 13:43

## 2015-04-17 NOTE — Discharge Summary (Signed)
PATIENT NAME:  Benjamin Grant, Benjamin Grant MR#:  [id number] DATE OF BIRTH:  1930/03/24  DATE OF ADMISSION:  01/10/2012 DATE OF DISCHARGE:  01/13/2012  DISCHARGE DIAGNOSES:  1. Left chest wall and abdominal wall hematoma secondary to coagulopathy. 2. Anemia secondary to hematoma.  3. Diabetes.  4. Hypertension.  5. Hyperlipidemia.  6. History of abdominal aortic aneurysm followed by Dr. Delana Meyer.    HOSPITAL COURSE: A 81 year old male who lives at [facility] assisted living. He has history of chronic atrial fibrillation on anticoagulation, diabetes, hypertension and history of abdominal aortic aneurysm. He was brought to the Emergency Room because he had a recent fall. He had extensive bruising of his abdominal wall and chest wall. When he came in he was found to be severely coagulopathic. His INR was found to be greater than 10; 10.2. He had a CT chest, abdomen done which showed that he had hematoma, thickening and adjacent stranding of the muscles of the left posterior chest wall and left abdominal likely secondary to posttraumatic contusion, most likely an intramuscular hematoma so the patient bled into the muscles of his chest and abdomen because of coagulopathy. His Coumadin was stopped. He received 1 unit of FFP and vitamin K. His hemoglobin when he came in was around 12.9. His white count was 18,000, most likely secondary to bleed. His hemoglobin dropped to 10.7 but he has remained stable 10.7 at discharge after four days it is still 10.7. His leukocytosis has resolved. His INR now is 1.9. He is off Coumadin. He had extensive ecchymosis and bruising of his left chest and abdominal wall so I am going to stop his Coumadin right now, but he may be a high risk for Coumadin considering frequent falls. He needs to have a conversation with his primary care physician about the risk of restarting Coumadin on him considering the severe hematoma that he had. Also his hemoglobin should be monitored as outpatient. His  blood pressure has been stable. It has been in the range of 124/71 to 113/76. I will stop his quinapril right now and his blood pressure is stable on metoprolol. I will just continue metoprolol on him at this time. He was evaluated by physical therapy and physical therapy says that he can return back to assisted living. I will order physical therapy at assisted living also.   MEDICATIONS AT DISCHARGE: Home medications which include:  1. Saw palmetto 450 mg twice a day. 2. Pepcid 20 mg as needed. 3. Victoza 1.8 mg subcutaneously once a day. 4. Digoxin 0.125 mg daily.  5. Namenda 10 mg b.i.d.  6. Donepezil 5 mg at bedtime.  7. Clindamycin 600 mg prior to dental procedure. 8. Colace 100 mg at bedtime. 9. Gabapentin 100 mg at bedtime.  10. Multivitamin daily.  11. Janumet 50/1000 mg b.i.d.  12. Meclizine 12.5 mg 1 to 2 tabs every six hours as needed. 13. Metoprolol ER 100 mg daily.  14. Oxycodone 15 mg every three hours as needed which is a home medication. 15. WelChol 625 mg 3 tabs b.i.d.  16. Simvastatin 40 mg at bedtime.   NOTE: Do not take Coumadin. Do not take Motrin, ibuprofen, Aleve. Do not take quinapril/hydrochlorothiazide.   DIET: Advised a low-sodium, ADA, low cholesterol diet.   ACTIVITY: As tolerated.   CONDITION AT DISCHARGE: He is comfortable.   PHYSICAL EXAMINATION: VITAL SIGNS: T-max 97.6, heart rate 97, blood pressure 101/70 to 113/76, saturating 97% on room air. Chest is clear. Abdomen is soft, nontender. He has extensive  ecchymosis and bruises involving the left chest and left abdominal wall.   FOLLOW UP: Patient should follow up with Dr. Rosanna Randy later this week. Follow up hemoglobin at Dr. Alben Spittle office. Home health physical therapy at [facility] assisted living.   TIME SPENT WITH DISCHARGE: 50 minutes.  ____________________________ Mena Pauls, MD ag:cms D: 01/13/2012 13:54:09 ET T: 01/13/2012 14:20:59 ET  JOB#: [id number] cc: Mena Pauls, MD,  <Dictator> Richard L. Rosanna Randy, MD Mena Pauls MD ELECTRONICALLY SIGNED 02/14/2012 13:43

## 2015-04-17 NOTE — Discharge Summary (Signed)
PATIENT NAME:  Benjamin Grant, Benjamin Grant MR#:  [id number] DATE OF BIRTH:  1930-10-20  DATE OF ADMISSION:  02/29/2012 DATE OF DISCHARGE:  03/04/2012   PRIMARY CARE PHYSICIAN: Miguel Aschoff, MD   REASON FOR ADMISSION: Abdominal distention.   DISCHARGE DIAGNOSES:  1. Urinary retention with probable bladder outlet obstruction.  2. Acute renal failure which is postobstructive in etiology, resolved after Foley catheter insertion.  3. Complicated Enterococcus faecalis urinary tract infection with possible pyelonephritis.  4. Chronic atrial fibrillation. The patient was previously deemed poor anticoagulation candidate and taken off Coumadin due to fall and abdominal wall hematoma.  5. History of type II diabetes mellitus. 6. History of hypertension.  7. History of dementia. 8. History of abdominal aortic aneurysm. 9. Inguinal hernia.   CODE STATUS: DO NOT RESUSCITATE.   CONSULT: Urology with Dr. Irene Shipper    PROCEDURE: CT of the abdomen and pelvis without contrast 02/29/2012 urinary retention with large amount of urine in the bladder, bilateral hydronephrosis, stable at present, hepatic cysts, aortoiliac stent graft, diverticulosis without evidence of acute diverticulitis or perforation, fat filled inguinal hernia on the left larger than a small hernia on the right.   PERTINENT LABORATORY DATA: BUN 54, creatinine 2.89 on admission; BUN 15, creatinine 0.8 from 03/02/2012. CBC normal on admission except for WBC 14.8; WBC normal at 10 from 03/02/2012. Blood cultures x2 from 02/29/2012 no growth to date. Urinalysis on admission had 3+ leukocyte esterase, 5 to 15 WBCs, no bacteria, cloudy urine. Urine culture from 02/29/2012 grew Enterococcus faecalis resistant to tetracycline, sensitive to ampicillin, nitrofurantoin, ciprofloxacin, and levofloxacin. EKG on admission atrial fibrillation, heart rate 102 beats per minute with left axis deviation. Hemoglobin A1c is 6.8.   BRIEF HISTORY/HOSPITAL COURSE: The  patient is a pleasant 81 year old male with past medical history of hypertension, diabetes, dementia, abdominal aortic aneurysm, and chronic atrial fibrillation who presented to the Emergency Department with complaints of abdominal distention. Please see dictated admission history and physical for pertinent details surrounding the onset of this hospitalization. Please see below for further details. 1. Abdominal distention with CT of the abdomen revealing urinary retention and a large amount of urine in the bladder with bilateral hydronephrosis. Urology was consulted and recommended Foley catheter insertion. He had evidence of acute renal failure on admission which was postobstructive in nature. Urology felt that the patient had urinary retention, probable bladder outlet obstruction, and recommended Foley catheter insertion and started patient on Flomax and finasteride therapy. Urology recommended keeping the Foley catheter in place until patient follows up with Urology as an outpatient and the patient will have an attempt at a voiding trial. His abdominal distention resolved after Foley catheter was inserted. Renal failure has also resolved after Foley catheter was inserted. He was noted to have a complicated urinary tract infection, possible pyelonephritis, urinalysis was indicative of a urinary tract infection, urine culture was obtained, and he was empirically started on IV antibiotics. Blood culture did not have any growth to date but urine culture grew out Enterococcus faecalis which is sensitive to Levaquin. Thereafter, he was switched from IV antibiotics to oral Levaquin and has a few more days remaining at the time of discharge to complete a one week course. His leukocytosis has resolved with antibiotic therapy. He is nontoxic at the time of discharge and there is no evidence of sepsis.  2. Chronic atrial fibrillation. Initially the patient was noted to have some RVR and tachycardia for which his  medication regimen has been adjusted. He will continue digoxin  and beta-blocker therapy and calcium channel blocker has been added to his regimen. With these measures, his heart rate became controlled. We started the patient on aspirin for CVA prophylaxis after discussing risks, benefits, and alternatives with the patient and his family. The patient was agreeable to taking aspirin for now and understood the risks of bleeding as did his son and granddaughter. The patient's wife apparently has stage IV dementia as well. The patient was previously deemed a poor anticoagulation candidate in the past and taken off Coumadin due to fall and abdominal hematoma. He will need to follow-up with Cardiology following discharge. 3. Type II diabetes mellitus, well controlled with A1c 6.8. He received sliding scale while hospitalized and can resume Janumet and Victoza upon hospital discharge. Janumet was initially held given acute renal failure, however, now the patient's renal failure has resolved after Foley cath insertion. 4. Hypertension. Blood pressure is well controlled. The patient is to continue metoprolol and Cardizem. 5. Dementia. The patient is to continue Aricept and Namenda.  6. History of abdominal aortic aneurysm. The patient will follow-up with his vascular surgeon, Dr. Delana Meyer, upon discharge.   On 03/04/2012 the patient was hemodynamically stable without any abdominal distention, pain, or any complaints and was felt to be stable for discharge back to [facility] facility with close outpatient follow-up to which the patient was agreeable.   DISCHARGE DISPOSITION: [facility] skilled nursing facility.   DISCHARGE CONDITION: Improved, stable.  DISCHARGE ACTIVITY: As tolerated.   DISCHARGE DIET: Low sodium, low fat, low cholesterol, ADA.   DISCHARGE MEDICATIONS: 1. Levaquin 500 mg p.o. daily x5 days.  2. Flomax 0.4 mg daily. 3. Proscar 5 mg daily. 4. Cardizem CD 180 mg daily.   5. Aspirin 81 mg daily.  6. Multivitamin one tablet daily. 7. Toprol-XL 100 mg daily.  8. Digoxin 125 mcg daily. 9. WelChol 625 mg 3 tabs p.o. b.i.d.  10. Janumet 50 mg/1000 mg p.o. b.i.d.  11. Namenda 10 mg b.i.d.  12. Victoza 1.8 mg subcutaneously daily.  13. Saw Palmetto 450 mg b.i.d.  14. Simvastatin 40 mg daily.  15. Gabapentin 100 mg daily.  16. Docusate 100 mg daily.   17. Donepezil 5 mg daily.  18. Oxycodone 5 mg p.o. q.4 hours p.r.n. pain.  19. Tylenol Extra Strength 500 mg p.o. q.4 to 6 hours p.r.n. pain.  20. Meclizine 12.5 mg 1 to 2 tablets p.o. q.6 hours p.r.n. dizziness. 21. Famotidine 20 mg p.o. daily.   DISCHARGE INSTRUCTIONS:  1. Take medications as prescribed.  2. Return to the Emergency Department for recurrence of symptoms.  3. Foley catheter to remain in place (with leg bag) until follow-up with Urology.   FOLLOW-UP INSTRUCTIONS: 1. Follow-up with Dr. Jacqlyn Larsen within one week for attempt at Foley removal and voiding trial and for urinary retention and probable bladder outlet obstruction.  2. Follow-up with Dr. Rosanna Randy or house MD at skilled nursing facility within one week. 3. Follow-up with your cardiologist in 2 to 3 weeks for atrial fibrillation. 4. Follow-up with Dr. Delana Meyer in 2 to 3 weeks for abdominal aortic aneurysm.  TIME SPENT ON DISCHARGE: Greater than 30 minutes.   ____________________________ Romie Jumper, MD knl:drc D: 03/04/2012 11:00:07 ET T: 03/04/2012 11:21:58 ET JOB#: [id number]  cc: Romie Jumper, MD, <Dictator> [place]. Rosanna Randy, MD Katha Cabal, MD Denice Bors. Jacqlyn Larsen, MD Romie Jumper MD ELECTRONICALLY SIGNED 03/05/2012 18:22

## 2015-04-17 NOTE — H&P (Signed)
PATIENT NAME:  Benjamin Grant, Benjamin Grant MR#:  [id number] DATE OF BIRTH:  10/16/1930  DATE OF ADMISSION:  02/29/2012  REFERRING PHYSICIAN: Dr. Thomasene Lot.   PRIMARY CARE PHYSICIAN: Dr. Rosanna Randy.   CHIEF COMPLAINT: Abdominal distention.   HISTORY OF PRESENT ILLNESS: The patient is a 81 year old male who resides at [facility] with history of atrial fibrillation, diabetes, hypertension who was brought in for abdominal distention. Of note, the patient has had abdominal distention for about a week per patient. He has had decreased urination off and on since then. He was apparently started on Flagyl as an outpatient for possible diverticulitis. He denies diarrhea. He has constipation. He denies having any fevers or chills. He has been having increase in BUN and creatinine. He states he has had problems with urination for 1 to 2 weeks. He states he just could not pee. He needed a catheterization yesterday. Here he was found to have acute renal failure with creatinine of 2.89 and abdominal distention, and the CT scan showed bilateral hydronephrosis and some paranephric stranding without evidence of diverticulitis per the CAT scan. He has mild leukocytosis and a urinalysis that was suggestive of infection. He has no fever. Hospitalist services was contacted for further evaluation and management. Currently the patient has a Foley and about 1.2 liters of urine in the catheter. He states his abdominal distention is much improved.   PAST MEDICAL HISTORY:  1. Atrial fibrillation, previously on Coumadin but in the setting of abdominal wall hematoma and a fall was stopped earlier this year.   2. Diabetes.   3. Hypertension.   4. History of abdominal aortic aneurysm, followup with Dr. Delana Meyer.   5. History of gastrointestinal bleeding.   PAST SURGICAL HISTORY: Right total knee replacement.   ALLERGIES: Penicillin.   CURRENT MEDICATIONS:  1. Digoxin 125 mcg daily.  2. Colace 1 capsule daily.  3. Donepezil 5 mg daily.   4. Famotidine 20 mg p.r.n.  5. Flagyl 500 mg was started on 02/25/2012 and stop date was supposed to be 03/03/2012.   6. Gabapentin 100 mg daily.  7. Janumet 50/1000 mg 1 tab 2 times a day.  8. Meclizine 12.5 mg every 6 hours as needed for dizziness.   9. Metoprolol succinate 100 mg daily.  10. Multivitamin daily.  11. Namenda 10 mg b.i.d.  12. Oxycodone 5 mg every 4 hours as needed for pain.  13. Saw Palmetto 450 mg 2 times a day.  14. Simvastatin 40 mg daily.  15. Tylenol 500 mg every 4 to 6 hours as needed for pain.   16. Victoza 1.8 mg once a day subcutaneous.   17. WelChol 625 mg 3 tabs twice a day.   SOCIAL HISTORY: Lives at [facility].  Stopped smoking about 10 years ago. Denies alcohol.   FAMILY HISTORY: Significant for CVA.   REVIEW OF SYSTEMS. CONSTITUTIONAL: Denies fever, fatigue, has generalized weakness. No weight changes. EYES: Denies blurry vision or double vision. ENT: No tinnitus, hearing loss, or snoring. RESPIRATORY: Denies cough, wheezing, shortness of breath or chronic obstructive pulmonary disease. CARDIOVASCULAR: Denies chest pain, orthopnea, or swellings in the legs. He has a history of atrial fibrillation and hypertension. GASTROINTESTINAL: Has abdominal distention which is better now. No nausea or vomiting. Has some constipation. No hematemesis or black tarry stools or blood per rectum. GENITOURINARY: Denies dysuria. Has history of urinary retention in the past. Denies incontinence or frequency. ENDOCRINE: Denies polyuria, nocturia, or thyroid problems. HEME/LYMPH: Denies anemia, had easy bruising prior. No bleeding.  SKIN: Denies rash.  MUSCULOSKELETAL: Has chronic arthritis. NEUROLOGIC: Denies numbness, weakness.  Has dementia. PSYCHIATRIC: Denies insomnia depression.   PHYSICAL EXAMINATION:  VITAL SIGNS: Temperature was 97.8 on arrival, pulse currently 104, respiratory rate 20, blood pressure 125/73, oxygen saturation 96% on room air.   GENERAL: The patient is  an elderly Caucasian male lying in bed in no obvious distress.   HEENT: Normocephalic, atraumatic. Pupils are equal and reactive. Anicteric sclerae. Moist mucous membranes.   NECK: No JVD. No thyroid tenderness. Neck supple.   CARDIOVASCULAR: S1, S2 irregularly irregular. No murmurs, rubs, or gallops.   LUNGS: Clear to auscultation. No wheezing or rhonchi.   ABDOMEN: Soft, nontender, nondistended. Positive bowel sounds in all quadrants. No organomegaly noted. The patient has a Foley catheter, about 1.2 liters of yellow urine.   EXTREMITIES: No significant edema.   NEUROLOGIC: Cranial nerves II through XII grossly intact.   PSYCHIATRIC: The patient is awake, alert, oriented x3. Mood is appropriate.   LABORATORIES: Sodium 140, potassium 4, creatinine 2.89, BUN 54. Glucose 235, albumin 2.9, AST 11, otherwise within normal limits and liver function tests. WBC is 14.8, hemoglobin 15.5, hematocrit 46.5, platelets are 161,000. INR is 1.2. UA: 2+ blood, 3+ leukocyte esterase, no nitrites, 515 WBCs, 3 RBC.  CT abdomen with pelvis without contrast showing urinary retention with large amount of urine in the bladder with bilateral hydronephrosis. There is some perinephric stranding. Stable hepatic cysts, aortoiliac stent graft, density in the internal iliac region on the left suggests previous possible colitis, diverticulosis without evidence of acute diverticulitis or perforation. Fat-filled inguinal hernia on the left larger than the small hernia on the right.   EKG: Atrial fibrillation, left axis deviation, no acute ST elevations or depressions. Q waves in the inferior leads. Rate is 103.   ASSESSMENT AND PLAN: We have a 81 year old Caucasian male with atrial fibrillation, hypertension, diabetes, with history of urinary retention who comes in with abdominal distention and acute renal failure in the setting of urinary retention, likely with bilateral hydronephrosis, possible pyelonephritis. The patient  necessitated catheterization yesterday. Today he again had urinary retention with abdominal distention which resolved with catheterization. There is no mention of stones in the CAT scan.   At this point I have discussed the patient with Dr. Irene Shipper who will see the patient tomorrow and recommend starting Flomax and finasteride which I would order. I would keep the Foley. The patient appears to have a positive urinalysis. Urine cultures have been sent. The patient possibly also has pyelonephritis. He does have some leukocytosis. I would just continue Levaquin which has been started here by the ED physician. I would order blood cultures as none have been ordered. I would check the GFR and electrolytes tomorrow. In regards to the atrial fibrillation, I would give a dose of metoprolol currently. I would hold the digoxin given the renal failure. Start the patient on sliding scale insulin and hold the oral diabetes medications. I would continue patient's dementia medicines.   CODE STATUS: THE PATIENT IS DO NOT RESUSCITATE.   TOTAL TIME SPENT: 50 minutes.    ____________________________ Vivien Presto, MD sa:vtd D: 02/29/2012 19:20:49 ET T: 03/01/2012 04:46:11 ET JOB#: [id number]  cc: Vivien Presto, MD, <Dictator> Richard L. Rosanna Randy, MD Vivien Presto MD ELECTRONICALLY SIGNED 03/14/2012 18:40

## 2015-04-25 DIAGNOSIS — I10 Essential (primary) hypertension: Secondary | ICD-10-CM | POA: Diagnosis not present

## 2015-04-25 DIAGNOSIS — E785 Hyperlipidemia, unspecified: Secondary | ICD-10-CM | POA: Diagnosis not present

## 2015-04-25 DIAGNOSIS — Z79899 Other long term (current) drug therapy: Secondary | ICD-10-CM | POA: Diagnosis not present

## 2015-04-25 DIAGNOSIS — E119 Type 2 diabetes mellitus without complications: Secondary | ICD-10-CM | POA: Diagnosis not present

## 2015-04-25 DIAGNOSIS — D649 Anemia, unspecified: Secondary | ICD-10-CM | POA: Diagnosis not present

## 2015-05-06 DIAGNOSIS — I10 Essential (primary) hypertension: Secondary | ICD-10-CM | POA: Diagnosis not present

## 2015-05-06 DIAGNOSIS — I4891 Unspecified atrial fibrillation: Secondary | ICD-10-CM | POA: Diagnosis not present

## 2015-05-06 DIAGNOSIS — E785 Hyperlipidemia, unspecified: Secondary | ICD-10-CM

## 2015-05-06 DIAGNOSIS — R21 Rash and other nonspecific skin eruption: Secondary | ICD-10-CM

## 2015-05-06 DIAGNOSIS — N400 Enlarged prostate without lower urinary tract symptoms: Secondary | ICD-10-CM | POA: Diagnosis not present

## 2015-05-06 DIAGNOSIS — F323 Major depressive disorder, single episode, severe with psychotic features: Secondary | ICD-10-CM

## 2015-05-06 DIAGNOSIS — K219 Gastro-esophageal reflux disease without esophagitis: Secondary | ICD-10-CM

## 2015-05-06 DIAGNOSIS — E1165 Type 2 diabetes mellitus with hyperglycemia: Secondary | ICD-10-CM | POA: Diagnosis not present

## 2015-06-09 DIAGNOSIS — H1031 Unspecified acute conjunctivitis, right eye: Secondary | ICD-10-CM | POA: Diagnosis not present

## 2015-06-16 DIAGNOSIS — H1089 Other conjunctivitis: Secondary | ICD-10-CM | POA: Diagnosis not present

## 2015-06-29 DIAGNOSIS — Z961 Presence of intraocular lens: Secondary | ICD-10-CM | POA: Diagnosis not present

## 2015-07-07 DIAGNOSIS — L820 Inflamed seborrheic keratosis: Secondary | ICD-10-CM | POA: Diagnosis not present

## 2015-07-07 DIAGNOSIS — D485 Neoplasm of uncertain behavior of skin: Secondary | ICD-10-CM | POA: Diagnosis not present

## 2015-07-07 DIAGNOSIS — Z85828 Personal history of other malignant neoplasm of skin: Secondary | ICD-10-CM | POA: Diagnosis not present

## 2015-07-09 DIAGNOSIS — F323 Major depressive disorder, single episode, severe with psychotic features: Secondary | ICD-10-CM

## 2015-07-09 DIAGNOSIS — N400 Enlarged prostate without lower urinary tract symptoms: Secondary | ICD-10-CM

## 2015-07-09 DIAGNOSIS — G3184 Mild cognitive impairment, so stated: Secondary | ICD-10-CM

## 2015-07-09 DIAGNOSIS — E785 Hyperlipidemia, unspecified: Secondary | ICD-10-CM

## 2015-07-09 DIAGNOSIS — I10 Essential (primary) hypertension: Secondary | ICD-10-CM

## 2015-07-09 DIAGNOSIS — I4891 Unspecified atrial fibrillation: Secondary | ICD-10-CM

## 2015-07-09 DIAGNOSIS — E1165 Type 2 diabetes mellitus with hyperglycemia: Secondary | ICD-10-CM

## 2015-08-09 DIAGNOSIS — C4441 Basal cell carcinoma of skin of scalp and neck: Secondary | ICD-10-CM | POA: Diagnosis not present

## 2015-08-22 DIAGNOSIS — C44602 Unspecified malignant neoplasm of skin of right upper limb, including shoulder: Secondary | ICD-10-CM | POA: Diagnosis not present

## 2015-08-31 DIAGNOSIS — N401 Enlarged prostate with lower urinary tract symptoms: Secondary | ICD-10-CM

## 2015-08-31 DIAGNOSIS — I4891 Unspecified atrial fibrillation: Secondary | ICD-10-CM | POA: Diagnosis not present

## 2015-08-31 DIAGNOSIS — K219 Gastro-esophageal reflux disease without esophagitis: Secondary | ICD-10-CM

## 2015-08-31 DIAGNOSIS — I10 Essential (primary) hypertension: Secondary | ICD-10-CM | POA: Diagnosis not present

## 2015-08-31 DIAGNOSIS — E785 Hyperlipidemia, unspecified: Secondary | ICD-10-CM | POA: Diagnosis not present

## 2015-08-31 DIAGNOSIS — F323 Major depressive disorder, single episode, severe with psychotic features: Secondary | ICD-10-CM

## 2015-08-31 DIAGNOSIS — G3184 Mild cognitive impairment, so stated: Secondary | ICD-10-CM

## 2015-08-31 DIAGNOSIS — E1165 Type 2 diabetes mellitus with hyperglycemia: Secondary | ICD-10-CM | POA: Diagnosis not present

## 2015-10-31 DIAGNOSIS — D649 Anemia, unspecified: Secondary | ICD-10-CM | POA: Diagnosis not present

## 2015-10-31 DIAGNOSIS — Z79899 Other long term (current) drug therapy: Secondary | ICD-10-CM | POA: Diagnosis not present

## 2015-10-31 DIAGNOSIS — E785 Hyperlipidemia, unspecified: Secondary | ICD-10-CM | POA: Diagnosis not present

## 2015-10-31 DIAGNOSIS — I10 Essential (primary) hypertension: Secondary | ICD-10-CM | POA: Diagnosis not present

## 2015-10-31 DIAGNOSIS — E119 Type 2 diabetes mellitus without complications: Secondary | ICD-10-CM | POA: Diagnosis not present

## 2015-11-03 DIAGNOSIS — Z85828 Personal history of other malignant neoplasm of skin: Secondary | ICD-10-CM | POA: Diagnosis not present

## 2015-11-03 DIAGNOSIS — D485 Neoplasm of uncertain behavior of skin: Secondary | ICD-10-CM | POA: Diagnosis not present

## 2015-11-03 DIAGNOSIS — L821 Other seborrheic keratosis: Secondary | ICD-10-CM | POA: Diagnosis not present

## 2015-11-03 DIAGNOSIS — L578 Other skin changes due to chronic exposure to nonionizing radiation: Secondary | ICD-10-CM | POA: Diagnosis not present

## 2015-11-03 DIAGNOSIS — L720 Epidermal cyst: Secondary | ICD-10-CM | POA: Diagnosis not present

## 2015-11-04 DIAGNOSIS — F323 Major depressive disorder, single episode, severe with psychotic features: Secondary | ICD-10-CM

## 2015-11-04 DIAGNOSIS — I10 Essential (primary) hypertension: Secondary | ICD-10-CM | POA: Diagnosis not present

## 2015-11-04 DIAGNOSIS — E785 Hyperlipidemia, unspecified: Secondary | ICD-10-CM | POA: Diagnosis not present

## 2015-11-04 DIAGNOSIS — I4891 Unspecified atrial fibrillation: Secondary | ICD-10-CM | POA: Diagnosis not present

## 2015-11-04 DIAGNOSIS — K219 Gastro-esophageal reflux disease without esophagitis: Secondary | ICD-10-CM

## 2015-11-04 DIAGNOSIS — E1165 Type 2 diabetes mellitus with hyperglycemia: Secondary | ICD-10-CM | POA: Diagnosis not present

## 2015-11-04 DIAGNOSIS — G3184 Mild cognitive impairment, so stated: Secondary | ICD-10-CM

## 2015-11-04 DIAGNOSIS — N400 Enlarged prostate without lower urinary tract symptoms: Secondary | ICD-10-CM

## 2015-12-27 DIAGNOSIS — Z961 Presence of intraocular lens: Secondary | ICD-10-CM | POA: Diagnosis not present

## 2016-01-02 DIAGNOSIS — R509 Fever, unspecified: Secondary | ICD-10-CM | POA: Diagnosis not present

## 2016-01-04 DIAGNOSIS — N401 Enlarged prostate with lower urinary tract symptoms: Secondary | ICD-10-CM

## 2016-01-04 DIAGNOSIS — I10 Essential (primary) hypertension: Secondary | ICD-10-CM | POA: Diagnosis not present

## 2016-01-04 DIAGNOSIS — E785 Hyperlipidemia, unspecified: Secondary | ICD-10-CM | POA: Diagnosis not present

## 2016-01-04 DIAGNOSIS — I4891 Unspecified atrial fibrillation: Secondary | ICD-10-CM | POA: Diagnosis not present

## 2016-01-04 DIAGNOSIS — K219 Gastro-esophageal reflux disease without esophagitis: Secondary | ICD-10-CM

## 2016-01-04 DIAGNOSIS — E1165 Type 2 diabetes mellitus with hyperglycemia: Secondary | ICD-10-CM | POA: Diagnosis not present

## 2016-01-04 DIAGNOSIS — F323 Major depressive disorder, single episode, severe with psychotic features: Secondary | ICD-10-CM

## 2016-01-04 DIAGNOSIS — G3184 Mild cognitive impairment, so stated: Secondary | ICD-10-CM

## 2016-01-04 DIAGNOSIS — J189 Pneumonia, unspecified organism: Secondary | ICD-10-CM

## 2016-02-04 DIAGNOSIS — M542 Cervicalgia: Secondary | ICD-10-CM | POA: Diagnosis not present

## 2016-02-06 ENCOUNTER — Telehealth: Payer: Self-pay | Admitting: *Deleted

## 2016-02-06 DIAGNOSIS — S1093XA Contusion of unspecified part of neck, initial encounter: Secondary | ICD-10-CM | POA: Diagnosis not present

## 2016-02-06 NOTE — Telephone Encounter (Signed)
[place] Call Center Patient Name: Benjamin Grant Gender: Unknown DOB: 04/12/1930 Age: 82 Y 4 M 3 D Return Phone Number: [phone] (Primary) Address: City/State/Zip: [place] Client [place] Night - Client Client Site [place] Physician Viviana Simpler Contact Type Call Call Type Triage / Clinical Caller Name [facility] Relationship To Patient Provider Return Phone Number [phone] (Primary) Chief Complaint Neck Injury Initial Comment Caller states patient fell in the shower and they need an x-ray. PreDisposition Call Doctor Translation No Nurse Assessment Nurse: Melina Copa, RN, [place] Date/Time ([place] Time): 02/03/2016 9:04:03 PM Confirm and document reason for call. If symptomatic, describe symptoms. You must click the next button to save text entered. ---Verne Grain at [facility] states patient fell in shower with nursing aides and hit back of head, small bump, neuro status and ambulatory intact, and needs an xray. Has the patient traveled out of the country within the last 30 days? ---No Does the patient have any new or worsening symptoms? ---Yes Will a triage be completed? ---Yes Related visit to physician within the last 2 weeks? ---No Does the PT have any chronic conditions? (i.e. diabetes, asthma, etc.) ---Yes List chronic conditions. ---dementia, a. fib., hyperlipidemia, diabetes type II. Is this a behavioral health or substance abuse call? ---No Guidelines Guideline Title Affirmed Question Affirmed Notes Nurse Date/Time Eilene Ghazi Time) Head Injury Scalp swelling, bruise or pain (all triage questions negative) Melina Copa, RN, Juanda Crumble 02/03/2016 9:15:40 PM Disp. Time Eilene Ghazi Time) Disposition Final User 02/03/2016 9:09:50 PM Attempt made - message left Thedore Mins 02/03/2016 [place],  RN, Juanda Crumble 02/03/2016 [place], RN, Juanda Crumble PLEASE NOTE: All timestamps contained within this report are represented as [place] Standard Time. CONFIDENTIALTY NOTICE: This fax transmission is intended only for the addressee. It contains information that is legally privileged, confidential or otherwise protected from use or disclosure. If you are not the intended recipient, you are strictly prohibited from reviewing, disclosing, copying using or disseminating any of this information or taking any action in reliance on or regarding this information. If you have received this fax in error, please notify us immediately by telephone so that we can arrange for its return to [place]. Phone: [phone], Toll-Free: [phone], Fax: [phone] Page: 2 of 2 Call Id: [id number] Caller Understands: Yes Disagree/Comply: Comply Care Advice Given Per Guideline HOME CARE: You should be able to treat this at home. REASSURANCE: It sounds like a scalp injury rather than a brain injury or concussion. Treatment at home should be safe. OBSERVATION: * The head-injured person should be observed closely during the first 2 hours following the injury. * The head-injured person should be awoken every 4 hours for the first 24 hours; check the ability to walk and talk. EXPECTED COURSE: * Pain and swelling usually begin to improve 2 or 3 days after an injury. * Most head trauma only causes an injury to the scalp. * Swelling is usually gone in 7 days. Pain and tenderness at the site may take 1-2 weeks to completely resolve. CALL BACK IF: * Severe headache persists over 2 hours after ice pack and pain medications * Extremity weakness or numbness occurs * Slurred speech or blurred vision occurs * Vomiting occurs * You become worse. CARE ADVICE given per Head Injury (Adult) guideline.

## 2016-02-06 NOTE — Telephone Encounter (Signed)
[place] Call Center Patient Name: LAWRENC FARRER Gender: Male DOB: 05/01/1930 Age: 82 Y 56 M 4 D Return Phone Number: [phone] (Primary) Address: City/State/Zip: [place] Client [place] Night - Client Client Site [place] Physician Viviana Simpler Contact Type Call Call Type Page Only Caller Name Helene Kelp Relationship To Patient Provider Is this call to report lab results? No Return Phone Number [phone] (Primary) Initial Comment Caller states Helene Kelp from [facility] - PT fell on Friday and hurt neck. X-ray for PT was negative but is in pain and wants to know if she can give him ibuprofen. Translation No Nurse Assessment Guidelines Guideline Title Affirmed Question Affirmed Notes Nurse Date/Time (Eastern Time) Disp. Time Eilene Ghazi Time) Disposition Final User 02/05/2016 1:28:29 PM Send to [place], Hannah 02/05/2016 1:35:19 PM Called On-Call Provider Caryl Comes 02/05/2016 1:35:51 PM Page Completed Yes Caryl Comes [facility] Phone DateTime Result/Outcome Message Type Notes Shanon Ace [id number] 02/05/2016 1:35:19 PM Called On Call Provider - Reached Doctor Paged Shanon Ace 02/05/2016 1:35:34 PM Spoke with On Call - General

## 2016-02-06 NOTE — Telephone Encounter (Signed)
Seen today. No major injury but neck is stiff

## 2016-02-14 DIAGNOSIS — M6281 Muscle weakness (generalized): Secondary | ICD-10-CM | POA: Diagnosis not present

## 2016-02-14 DIAGNOSIS — M25511 Pain in right shoulder: Secondary | ICD-10-CM | POA: Diagnosis not present

## 2016-02-14 DIAGNOSIS — M25512 Pain in left shoulder: Secondary | ICD-10-CM | POA: Diagnosis not present

## 2016-02-14 DIAGNOSIS — R2689 Other abnormalities of gait and mobility: Secondary | ICD-10-CM | POA: Diagnosis not present

## 2016-02-14 DIAGNOSIS — M542 Cervicalgia: Secondary | ICD-10-CM | POA: Diagnosis not present

## 2016-02-15 DIAGNOSIS — M5430 Sciatica, unspecified side: Secondary | ICD-10-CM | POA: Diagnosis not present

## 2016-02-15 DIAGNOSIS — M25512 Pain in left shoulder: Secondary | ICD-10-CM | POA: Diagnosis not present

## 2016-02-15 DIAGNOSIS — I10 Essential (primary) hypertension: Secondary | ICD-10-CM | POA: Diagnosis not present

## 2016-02-15 DIAGNOSIS — M25511 Pain in right shoulder: Secondary | ICD-10-CM | POA: Diagnosis not present

## 2016-02-15 DIAGNOSIS — M6281 Muscle weakness (generalized): Secondary | ICD-10-CM | POA: Diagnosis not present

## 2016-02-15 DIAGNOSIS — M25311 Other instability, right shoulder: Secondary | ICD-10-CM | POA: Diagnosis not present

## 2016-02-15 DIAGNOSIS — R2689 Other abnormalities of gait and mobility: Secondary | ICD-10-CM | POA: Diagnosis not present

## 2016-02-15 DIAGNOSIS — M542 Cervicalgia: Secondary | ICD-10-CM | POA: Diagnosis not present

## 2016-02-15 DIAGNOSIS — M546 Pain in thoracic spine: Secondary | ICD-10-CM | POA: Diagnosis not present

## 2016-02-16 DIAGNOSIS — R2689 Other abnormalities of gait and mobility: Secondary | ICD-10-CM | POA: Diagnosis not present

## 2016-02-16 DIAGNOSIS — M25512 Pain in left shoulder: Secondary | ICD-10-CM | POA: Diagnosis not present

## 2016-02-16 DIAGNOSIS — M25511 Pain in right shoulder: Secondary | ICD-10-CM | POA: Diagnosis not present

## 2016-02-16 DIAGNOSIS — M542 Cervicalgia: Secondary | ICD-10-CM | POA: Diagnosis not present

## 2016-02-16 DIAGNOSIS — M6281 Muscle weakness (generalized): Secondary | ICD-10-CM | POA: Diagnosis not present

## 2016-02-17 DIAGNOSIS — M25512 Pain in left shoulder: Secondary | ICD-10-CM | POA: Diagnosis not present

## 2016-02-17 DIAGNOSIS — R2689 Other abnormalities of gait and mobility: Secondary | ICD-10-CM | POA: Diagnosis not present

## 2016-02-17 DIAGNOSIS — M6281 Muscle weakness (generalized): Secondary | ICD-10-CM | POA: Diagnosis not present

## 2016-02-17 DIAGNOSIS — M25511 Pain in right shoulder: Secondary | ICD-10-CM | POA: Diagnosis not present

## 2016-02-17 DIAGNOSIS — M542 Cervicalgia: Secondary | ICD-10-CM | POA: Diagnosis not present

## 2016-02-20 DIAGNOSIS — M542 Cervicalgia: Secondary | ICD-10-CM | POA: Diagnosis not present

## 2016-02-20 DIAGNOSIS — M25512 Pain in left shoulder: Secondary | ICD-10-CM | POA: Diagnosis not present

## 2016-02-20 DIAGNOSIS — M25511 Pain in right shoulder: Secondary | ICD-10-CM | POA: Diagnosis not present

## 2016-02-20 DIAGNOSIS — R2689 Other abnormalities of gait and mobility: Secondary | ICD-10-CM | POA: Diagnosis not present

## 2016-02-20 DIAGNOSIS — M6281 Muscle weakness (generalized): Secondary | ICD-10-CM | POA: Diagnosis not present

## 2016-02-21 DIAGNOSIS — M542 Cervicalgia: Secondary | ICD-10-CM | POA: Diagnosis not present

## 2016-02-21 DIAGNOSIS — M25511 Pain in right shoulder: Secondary | ICD-10-CM | POA: Diagnosis not present

## 2016-02-21 DIAGNOSIS — R2689 Other abnormalities of gait and mobility: Secondary | ICD-10-CM | POA: Diagnosis not present

## 2016-02-21 DIAGNOSIS — M25512 Pain in left shoulder: Secondary | ICD-10-CM | POA: Diagnosis not present

## 2016-02-21 DIAGNOSIS — M6281 Muscle weakness (generalized): Secondary | ICD-10-CM | POA: Diagnosis not present

## 2016-02-22 DIAGNOSIS — M542 Cervicalgia: Secondary | ICD-10-CM | POA: Diagnosis not present

## 2016-02-22 DIAGNOSIS — R2689 Other abnormalities of gait and mobility: Secondary | ICD-10-CM | POA: Diagnosis not present

## 2016-02-22 DIAGNOSIS — E1165 Type 2 diabetes mellitus with hyperglycemia: Secondary | ICD-10-CM | POA: Diagnosis not present

## 2016-02-22 DIAGNOSIS — M25511 Pain in right shoulder: Secondary | ICD-10-CM | POA: Diagnosis not present

## 2016-02-22 DIAGNOSIS — R296 Repeated falls: Secondary | ICD-10-CM | POA: Diagnosis not present

## 2016-02-22 DIAGNOSIS — I4891 Unspecified atrial fibrillation: Secondary | ICD-10-CM | POA: Diagnosis not present

## 2016-02-22 DIAGNOSIS — N401 Enlarged prostate with lower urinary tract symptoms: Secondary | ICD-10-CM

## 2016-02-22 DIAGNOSIS — F323 Major depressive disorder, single episode, severe with psychotic features: Secondary | ICD-10-CM

## 2016-02-22 DIAGNOSIS — M6281 Muscle weakness (generalized): Secondary | ICD-10-CM | POA: Diagnosis not present

## 2016-02-22 DIAGNOSIS — G3184 Mild cognitive impairment, so stated: Secondary | ICD-10-CM

## 2016-02-22 DIAGNOSIS — M25512 Pain in left shoulder: Secondary | ICD-10-CM | POA: Diagnosis not present

## 2016-02-22 DIAGNOSIS — K219 Gastro-esophageal reflux disease without esophagitis: Secondary | ICD-10-CM

## 2016-02-22 DIAGNOSIS — I10 Essential (primary) hypertension: Secondary | ICD-10-CM | POA: Diagnosis not present

## 2016-02-22 DIAGNOSIS — F0390 Unspecified dementia without behavioral disturbance: Secondary | ICD-10-CM | POA: Diagnosis not present

## 2016-02-22 DIAGNOSIS — E785 Hyperlipidemia, unspecified: Secondary | ICD-10-CM | POA: Diagnosis not present

## 2016-02-23 DIAGNOSIS — M542 Cervicalgia: Secondary | ICD-10-CM | POA: Diagnosis not present

## 2016-02-23 DIAGNOSIS — R296 Repeated falls: Secondary | ICD-10-CM | POA: Diagnosis not present

## 2016-02-23 DIAGNOSIS — M25511 Pain in right shoulder: Secondary | ICD-10-CM | POA: Diagnosis not present

## 2016-02-23 DIAGNOSIS — M25512 Pain in left shoulder: Secondary | ICD-10-CM | POA: Diagnosis not present

## 2016-02-23 DIAGNOSIS — F0390 Unspecified dementia without behavioral disturbance: Secondary | ICD-10-CM | POA: Diagnosis not present

## 2016-02-23 DIAGNOSIS — M6281 Muscle weakness (generalized): Secondary | ICD-10-CM | POA: Diagnosis not present

## 2016-02-24 DIAGNOSIS — M25511 Pain in right shoulder: Secondary | ICD-10-CM | POA: Diagnosis not present

## 2016-02-24 DIAGNOSIS — R296 Repeated falls: Secondary | ICD-10-CM | POA: Diagnosis not present

## 2016-02-24 DIAGNOSIS — M542 Cervicalgia: Secondary | ICD-10-CM | POA: Diagnosis not present

## 2016-02-24 DIAGNOSIS — M6281 Muscle weakness (generalized): Secondary | ICD-10-CM | POA: Diagnosis not present

## 2016-02-24 DIAGNOSIS — M25512 Pain in left shoulder: Secondary | ICD-10-CM | POA: Diagnosis not present

## 2016-02-24 DIAGNOSIS — F0390 Unspecified dementia without behavioral disturbance: Secondary | ICD-10-CM | POA: Diagnosis not present

## 2016-02-27 DIAGNOSIS — M6281 Muscle weakness (generalized): Secondary | ICD-10-CM | POA: Diagnosis not present

## 2016-02-27 DIAGNOSIS — M542 Cervicalgia: Secondary | ICD-10-CM | POA: Diagnosis not present

## 2016-02-27 DIAGNOSIS — R296 Repeated falls: Secondary | ICD-10-CM | POA: Diagnosis not present

## 2016-02-27 DIAGNOSIS — M25512 Pain in left shoulder: Secondary | ICD-10-CM | POA: Diagnosis not present

## 2016-02-27 DIAGNOSIS — F0390 Unspecified dementia without behavioral disturbance: Secondary | ICD-10-CM | POA: Diagnosis not present

## 2016-02-27 DIAGNOSIS — M25511 Pain in right shoulder: Secondary | ICD-10-CM | POA: Diagnosis not present

## 2016-02-28 DIAGNOSIS — R296 Repeated falls: Secondary | ICD-10-CM | POA: Diagnosis not present

## 2016-02-28 DIAGNOSIS — M25511 Pain in right shoulder: Secondary | ICD-10-CM | POA: Diagnosis not present

## 2016-02-28 DIAGNOSIS — M6281 Muscle weakness (generalized): Secondary | ICD-10-CM | POA: Diagnosis not present

## 2016-02-28 DIAGNOSIS — F0390 Unspecified dementia without behavioral disturbance: Secondary | ICD-10-CM | POA: Diagnosis not present

## 2016-02-28 DIAGNOSIS — M25512 Pain in left shoulder: Secondary | ICD-10-CM | POA: Diagnosis not present

## 2016-02-28 DIAGNOSIS — M542 Cervicalgia: Secondary | ICD-10-CM | POA: Diagnosis not present

## 2016-02-29 DIAGNOSIS — M25512 Pain in left shoulder: Secondary | ICD-10-CM | POA: Diagnosis not present

## 2016-02-29 DIAGNOSIS — M542 Cervicalgia: Secondary | ICD-10-CM | POA: Diagnosis not present

## 2016-02-29 DIAGNOSIS — R296 Repeated falls: Secondary | ICD-10-CM | POA: Diagnosis not present

## 2016-02-29 DIAGNOSIS — F0390 Unspecified dementia without behavioral disturbance: Secondary | ICD-10-CM | POA: Diagnosis not present

## 2016-02-29 DIAGNOSIS — M25511 Pain in right shoulder: Secondary | ICD-10-CM | POA: Diagnosis not present

## 2016-02-29 DIAGNOSIS — M6281 Muscle weakness (generalized): Secondary | ICD-10-CM | POA: Diagnosis not present

## 2016-03-01 DIAGNOSIS — F0390 Unspecified dementia without behavioral disturbance: Secondary | ICD-10-CM | POA: Diagnosis not present

## 2016-03-01 DIAGNOSIS — M542 Cervicalgia: Secondary | ICD-10-CM | POA: Diagnosis not present

## 2016-03-01 DIAGNOSIS — M25512 Pain in left shoulder: Secondary | ICD-10-CM | POA: Diagnosis not present

## 2016-03-01 DIAGNOSIS — M25511 Pain in right shoulder: Secondary | ICD-10-CM | POA: Diagnosis not present

## 2016-03-01 DIAGNOSIS — R296 Repeated falls: Secondary | ICD-10-CM | POA: Diagnosis not present

## 2016-03-01 DIAGNOSIS — M6281 Muscle weakness (generalized): Secondary | ICD-10-CM | POA: Diagnosis not present

## 2016-03-02 DIAGNOSIS — M25511 Pain in right shoulder: Secondary | ICD-10-CM | POA: Diagnosis not present

## 2016-03-02 DIAGNOSIS — R296 Repeated falls: Secondary | ICD-10-CM | POA: Diagnosis not present

## 2016-03-02 DIAGNOSIS — M542 Cervicalgia: Secondary | ICD-10-CM | POA: Diagnosis not present

## 2016-03-02 DIAGNOSIS — M6281 Muscle weakness (generalized): Secondary | ICD-10-CM | POA: Diagnosis not present

## 2016-03-02 DIAGNOSIS — M25512 Pain in left shoulder: Secondary | ICD-10-CM | POA: Diagnosis not present

## 2016-03-02 DIAGNOSIS — F0390 Unspecified dementia without behavioral disturbance: Secondary | ICD-10-CM | POA: Diagnosis not present

## 2016-03-05 DIAGNOSIS — M6281 Muscle weakness (generalized): Secondary | ICD-10-CM | POA: Diagnosis not present

## 2016-03-05 DIAGNOSIS — R296 Repeated falls: Secondary | ICD-10-CM | POA: Diagnosis not present

## 2016-03-05 DIAGNOSIS — R6889 Other general symptoms and signs: Secondary | ICD-10-CM | POA: Diagnosis not present

## 2016-03-05 DIAGNOSIS — M25512 Pain in left shoulder: Secondary | ICD-10-CM | POA: Diagnosis not present

## 2016-03-05 DIAGNOSIS — F0390 Unspecified dementia without behavioral disturbance: Secondary | ICD-10-CM | POA: Diagnosis not present

## 2016-03-05 DIAGNOSIS — M25511 Pain in right shoulder: Secondary | ICD-10-CM | POA: Diagnosis not present

## 2016-03-05 DIAGNOSIS — M542 Cervicalgia: Secondary | ICD-10-CM | POA: Diagnosis not present

## 2016-03-06 DIAGNOSIS — R296 Repeated falls: Secondary | ICD-10-CM | POA: Diagnosis not present

## 2016-03-06 DIAGNOSIS — M25511 Pain in right shoulder: Secondary | ICD-10-CM | POA: Diagnosis not present

## 2016-03-06 DIAGNOSIS — M542 Cervicalgia: Secondary | ICD-10-CM | POA: Diagnosis not present

## 2016-03-06 DIAGNOSIS — F0390 Unspecified dementia without behavioral disturbance: Secondary | ICD-10-CM | POA: Diagnosis not present

## 2016-03-06 DIAGNOSIS — M6281 Muscle weakness (generalized): Secondary | ICD-10-CM | POA: Diagnosis not present

## 2016-03-06 DIAGNOSIS — M25512 Pain in left shoulder: Secondary | ICD-10-CM | POA: Diagnosis not present

## 2016-03-07 DIAGNOSIS — F0390 Unspecified dementia without behavioral disturbance: Secondary | ICD-10-CM | POA: Diagnosis not present

## 2016-03-07 DIAGNOSIS — M542 Cervicalgia: Secondary | ICD-10-CM | POA: Diagnosis not present

## 2016-03-07 DIAGNOSIS — M6281 Muscle weakness (generalized): Secondary | ICD-10-CM | POA: Diagnosis not present

## 2016-03-07 DIAGNOSIS — M25512 Pain in left shoulder: Secondary | ICD-10-CM | POA: Diagnosis not present

## 2016-03-07 DIAGNOSIS — M25511 Pain in right shoulder: Secondary | ICD-10-CM | POA: Diagnosis not present

## 2016-03-07 DIAGNOSIS — R296 Repeated falls: Secondary | ICD-10-CM | POA: Diagnosis not present

## 2016-03-08 DIAGNOSIS — M6281 Muscle weakness (generalized): Secondary | ICD-10-CM | POA: Diagnosis not present

## 2016-03-08 DIAGNOSIS — M25512 Pain in left shoulder: Secondary | ICD-10-CM | POA: Diagnosis not present

## 2016-03-08 DIAGNOSIS — M542 Cervicalgia: Secondary | ICD-10-CM | POA: Diagnosis not present

## 2016-03-08 DIAGNOSIS — R296 Repeated falls: Secondary | ICD-10-CM | POA: Diagnosis not present

## 2016-03-08 DIAGNOSIS — M25511 Pain in right shoulder: Secondary | ICD-10-CM | POA: Diagnosis not present

## 2016-03-08 DIAGNOSIS — F0390 Unspecified dementia without behavioral disturbance: Secondary | ICD-10-CM | POA: Diagnosis not present

## 2016-03-09 DIAGNOSIS — R296 Repeated falls: Secondary | ICD-10-CM | POA: Diagnosis not present

## 2016-03-09 DIAGNOSIS — M25512 Pain in left shoulder: Secondary | ICD-10-CM | POA: Diagnosis not present

## 2016-03-09 DIAGNOSIS — M25511 Pain in right shoulder: Secondary | ICD-10-CM | POA: Diagnosis not present

## 2016-03-09 DIAGNOSIS — M542 Cervicalgia: Secondary | ICD-10-CM | POA: Diagnosis not present

## 2016-03-09 DIAGNOSIS — M6281 Muscle weakness (generalized): Secondary | ICD-10-CM | POA: Diagnosis not present

## 2016-03-09 DIAGNOSIS — F0390 Unspecified dementia without behavioral disturbance: Secondary | ICD-10-CM | POA: Diagnosis not present

## 2016-03-12 DIAGNOSIS — M25512 Pain in left shoulder: Secondary | ICD-10-CM | POA: Diagnosis not present

## 2016-03-12 DIAGNOSIS — M25511 Pain in right shoulder: Secondary | ICD-10-CM | POA: Diagnosis not present

## 2016-03-12 DIAGNOSIS — M6281 Muscle weakness (generalized): Secondary | ICD-10-CM | POA: Diagnosis not present

## 2016-03-12 DIAGNOSIS — R296 Repeated falls: Secondary | ICD-10-CM | POA: Diagnosis not present

## 2016-03-12 DIAGNOSIS — F0390 Unspecified dementia without behavioral disturbance: Secondary | ICD-10-CM | POA: Diagnosis not present

## 2016-03-12 DIAGNOSIS — M542 Cervicalgia: Secondary | ICD-10-CM | POA: Diagnosis not present

## 2016-03-13 DIAGNOSIS — F0390 Unspecified dementia without behavioral disturbance: Secondary | ICD-10-CM | POA: Diagnosis not present

## 2016-03-13 DIAGNOSIS — M6281 Muscle weakness (generalized): Secondary | ICD-10-CM | POA: Diagnosis not present

## 2016-03-13 DIAGNOSIS — M542 Cervicalgia: Secondary | ICD-10-CM | POA: Diagnosis not present

## 2016-03-13 DIAGNOSIS — M25512 Pain in left shoulder: Secondary | ICD-10-CM | POA: Diagnosis not present

## 2016-03-13 DIAGNOSIS — R296 Repeated falls: Secondary | ICD-10-CM | POA: Diagnosis not present

## 2016-03-13 DIAGNOSIS — M25511 Pain in right shoulder: Secondary | ICD-10-CM | POA: Diagnosis not present

## 2016-03-14 DIAGNOSIS — M25512 Pain in left shoulder: Secondary | ICD-10-CM | POA: Diagnosis not present

## 2016-03-14 DIAGNOSIS — M6281 Muscle weakness (generalized): Secondary | ICD-10-CM | POA: Diagnosis not present

## 2016-03-14 DIAGNOSIS — M25511 Pain in right shoulder: Secondary | ICD-10-CM | POA: Diagnosis not present

## 2016-03-14 DIAGNOSIS — R296 Repeated falls: Secondary | ICD-10-CM | POA: Diagnosis not present

## 2016-03-14 DIAGNOSIS — F0390 Unspecified dementia without behavioral disturbance: Secondary | ICD-10-CM | POA: Diagnosis not present

## 2016-03-14 DIAGNOSIS — M542 Cervicalgia: Secondary | ICD-10-CM | POA: Diagnosis not present

## 2016-03-15 DIAGNOSIS — M25511 Pain in right shoulder: Secondary | ICD-10-CM | POA: Diagnosis not present

## 2016-03-15 DIAGNOSIS — M25512 Pain in left shoulder: Secondary | ICD-10-CM | POA: Diagnosis not present

## 2016-03-15 DIAGNOSIS — M542 Cervicalgia: Secondary | ICD-10-CM | POA: Diagnosis not present

## 2016-03-15 DIAGNOSIS — R296 Repeated falls: Secondary | ICD-10-CM | POA: Diagnosis not present

## 2016-03-15 DIAGNOSIS — M6281 Muscle weakness (generalized): Secondary | ICD-10-CM | POA: Diagnosis not present

## 2016-03-15 DIAGNOSIS — F0390 Unspecified dementia without behavioral disturbance: Secondary | ICD-10-CM | POA: Diagnosis not present

## 2016-03-19 DIAGNOSIS — F0390 Unspecified dementia without behavioral disturbance: Secondary | ICD-10-CM | POA: Diagnosis not present

## 2016-03-19 DIAGNOSIS — R296 Repeated falls: Secondary | ICD-10-CM | POA: Diagnosis not present

## 2016-03-19 DIAGNOSIS — M25511 Pain in right shoulder: Secondary | ICD-10-CM | POA: Diagnosis not present

## 2016-03-19 DIAGNOSIS — M6281 Muscle weakness (generalized): Secondary | ICD-10-CM | POA: Diagnosis not present

## 2016-03-19 DIAGNOSIS — M542 Cervicalgia: Secondary | ICD-10-CM | POA: Diagnosis not present

## 2016-03-19 DIAGNOSIS — M25512 Pain in left shoulder: Secondary | ICD-10-CM | POA: Diagnosis not present

## 2016-03-29 DIAGNOSIS — M7541 Impingement syndrome of right shoulder: Secondary | ICD-10-CM | POA: Diagnosis not present

## 2016-04-23 DIAGNOSIS — I10 Essential (primary) hypertension: Secondary | ICD-10-CM | POA: Diagnosis not present

## 2016-04-23 DIAGNOSIS — D649 Anemia, unspecified: Secondary | ICD-10-CM | POA: Diagnosis not present

## 2016-04-23 DIAGNOSIS — E119 Type 2 diabetes mellitus without complications: Secondary | ICD-10-CM | POA: Diagnosis not present

## 2016-04-23 DIAGNOSIS — Z79899 Other long term (current) drug therapy: Secondary | ICD-10-CM | POA: Diagnosis not present

## 2016-04-23 DIAGNOSIS — E785 Hyperlipidemia, unspecified: Secondary | ICD-10-CM | POA: Diagnosis not present

## 2016-05-02 DIAGNOSIS — K219 Gastro-esophageal reflux disease without esophagitis: Secondary | ICD-10-CM | POA: Diagnosis not present

## 2016-05-02 DIAGNOSIS — I4891 Unspecified atrial fibrillation: Secondary | ICD-10-CM

## 2016-05-02 DIAGNOSIS — E1165 Type 2 diabetes mellitus with hyperglycemia: Secondary | ICD-10-CM

## 2016-05-02 DIAGNOSIS — I10 Essential (primary) hypertension: Secondary | ICD-10-CM | POA: Diagnosis not present

## 2016-05-02 DIAGNOSIS — M25511 Pain in right shoulder: Secondary | ICD-10-CM

## 2016-05-02 DIAGNOSIS — N401 Enlarged prostate with lower urinary tract symptoms: Secondary | ICD-10-CM | POA: Diagnosis not present

## 2016-05-02 DIAGNOSIS — F323 Major depressive disorder, single episode, severe with psychotic features: Secondary | ICD-10-CM

## 2016-05-02 DIAGNOSIS — E785 Hyperlipidemia, unspecified: Secondary | ICD-10-CM | POA: Diagnosis not present

## 2016-05-02 DIAGNOSIS — G3184 Mild cognitive impairment, so stated: Secondary | ICD-10-CM

## 2016-05-07 DIAGNOSIS — M7541 Impingement syndrome of right shoulder: Secondary | ICD-10-CM | POA: Diagnosis not present

## 2016-05-08 ENCOUNTER — Other Ambulatory Visit: Payer: Self-pay | Admitting: Specialist

## 2016-05-08 DIAGNOSIS — M7541 Impingement syndrome of right shoulder: Secondary | ICD-10-CM

## 2016-05-10 DIAGNOSIS — I739 Peripheral vascular disease, unspecified: Secondary | ICD-10-CM | POA: Diagnosis not present

## 2016-05-10 DIAGNOSIS — I714 Abdominal aortic aneurysm, without rupture: Secondary | ICD-10-CM | POA: Diagnosis not present

## 2016-05-10 DIAGNOSIS — I10 Essential (primary) hypertension: Secondary | ICD-10-CM | POA: Diagnosis not present

## 2016-05-10 DIAGNOSIS — M79609 Pain in unspecified limb: Secondary | ICD-10-CM | POA: Diagnosis not present

## 2016-05-10 DIAGNOSIS — E119 Type 2 diabetes mellitus without complications: Secondary | ICD-10-CM | POA: Diagnosis not present

## 2016-05-10 DIAGNOSIS — E785 Hyperlipidemia, unspecified: Secondary | ICD-10-CM | POA: Diagnosis not present

## 2016-05-10 DIAGNOSIS — E669 Obesity, unspecified: Secondary | ICD-10-CM | POA: Diagnosis not present

## 2016-05-30 ENCOUNTER — Ambulatory Visit
Admission: RE | Admit: 2016-05-30 | Discharge: 2016-05-30 | Disposition: A | Discharge status: Home / Self Care | Payer: Medicare Other | Source: Ambulatory Visit | Attending: Specialist | Admitting: Specialist

## 2016-05-30 DIAGNOSIS — S46901A Unspecified injury of unspecified muscle, fascia and tendon at shoulder and upper arm level, right arm, initial encounter: Secondary | ICD-10-CM | POA: Diagnosis not present

## 2016-05-30 DIAGNOSIS — M7581 Other shoulder lesions, right shoulder: Secondary | ICD-10-CM | POA: Diagnosis not present

## 2016-05-30 DIAGNOSIS — M7541 Impingement syndrome of right shoulder: Secondary | ICD-10-CM | POA: Insufficient documentation

## 2016-06-25 DIAGNOSIS — M7541 Impingement syndrome of right shoulder: Secondary | ICD-10-CM | POA: Diagnosis not present

## 2016-07-06 DIAGNOSIS — I4891 Unspecified atrial fibrillation: Secondary | ICD-10-CM | POA: Diagnosis not present

## 2016-07-06 DIAGNOSIS — K219 Gastro-esophageal reflux disease without esophagitis: Secondary | ICD-10-CM

## 2016-07-06 DIAGNOSIS — G3184 Mild cognitive impairment, so stated: Secondary | ICD-10-CM

## 2016-07-06 DIAGNOSIS — E785 Hyperlipidemia, unspecified: Secondary | ICD-10-CM | POA: Diagnosis not present

## 2016-07-06 DIAGNOSIS — F323 Major depressive disorder, single episode, severe with psychotic features: Secondary | ICD-10-CM

## 2016-07-06 DIAGNOSIS — N401 Enlarged prostate with lower urinary tract symptoms: Secondary | ICD-10-CM

## 2016-07-06 DIAGNOSIS — M25511 Pain in right shoulder: Secondary | ICD-10-CM

## 2016-07-06 DIAGNOSIS — I10 Essential (primary) hypertension: Secondary | ICD-10-CM | POA: Diagnosis not present

## 2016-07-06 DIAGNOSIS — E119 Type 2 diabetes mellitus without complications: Secondary | ICD-10-CM | POA: Diagnosis not present

## 2016-07-20 DIAGNOSIS — M5412 Radiculopathy, cervical region: Secondary | ICD-10-CM | POA: Diagnosis not present

## 2016-07-20 DIAGNOSIS — M542 Cervicalgia: Secondary | ICD-10-CM | POA: Diagnosis not present

## 2016-07-26 DIAGNOSIS — E119 Type 2 diabetes mellitus without complications: Secondary | ICD-10-CM | POA: Diagnosis not present

## 2016-08-07 ENCOUNTER — Other Ambulatory Visit: Payer: Self-pay | Admitting: Orthopedic Surgery

## 2016-08-07 DIAGNOSIS — M5412 Radiculopathy, cervical region: Secondary | ICD-10-CM

## 2016-08-09 ENCOUNTER — Ambulatory Visit: Payer: Medicare Other

## 2016-08-17 ENCOUNTER — Ambulatory Visit
Admission: RE | Admit: 2016-08-17 | Discharge: 2016-08-17 | Disposition: A | Discharge status: Home / Self Care | Payer: Medicare Other | Source: Ambulatory Visit | Attending: Orthopedic Surgery | Admitting: Orthopedic Surgery

## 2016-08-17 DIAGNOSIS — M50223 Other cervical disc displacement at C6-C7 level: Secondary | ICD-10-CM | POA: Diagnosis not present

## 2016-08-17 DIAGNOSIS — M5412 Radiculopathy, cervical region: Secondary | ICD-10-CM | POA: Diagnosis not present

## 2016-08-17 DIAGNOSIS — M50222 Other cervical disc displacement at C5-C6 level: Secondary | ICD-10-CM | POA: Diagnosis not present

## 2016-08-24 ENCOUNTER — Other Ambulatory Visit: Payer: Self-pay

## 2016-08-29 DIAGNOSIS — R21 Rash and other nonspecific skin eruption: Secondary | ICD-10-CM | POA: Diagnosis not present

## 2016-08-31 DIAGNOSIS — I4891 Unspecified atrial fibrillation: Secondary | ICD-10-CM | POA: Diagnosis not present

## 2016-08-31 DIAGNOSIS — K219 Gastro-esophageal reflux disease without esophagitis: Secondary | ICD-10-CM | POA: Diagnosis not present

## 2016-08-31 DIAGNOSIS — E119 Type 2 diabetes mellitus without complications: Secondary | ICD-10-CM | POA: Diagnosis not present

## 2016-08-31 DIAGNOSIS — N401 Enlarged prostate with lower urinary tract symptoms: Secondary | ICD-10-CM

## 2016-08-31 DIAGNOSIS — G3184 Mild cognitive impairment, so stated: Secondary | ICD-10-CM

## 2016-08-31 DIAGNOSIS — R21 Rash and other nonspecific skin eruption: Secondary | ICD-10-CM

## 2016-08-31 DIAGNOSIS — I10 Essential (primary) hypertension: Secondary | ICD-10-CM

## 2016-08-31 DIAGNOSIS — E785 Hyperlipidemia, unspecified: Secondary | ICD-10-CM | POA: Diagnosis not present

## 2016-08-31 DIAGNOSIS — F323 Major depressive disorder, single episode, severe with psychotic features: Secondary | ICD-10-CM

## 2016-09-19 DIAGNOSIS — D1800 Hemangioma unspecified site: Secondary | ICD-10-CM | POA: Diagnosis not present

## 2016-09-19 DIAGNOSIS — L304 Erythema intertrigo: Secondary | ICD-10-CM | POA: Diagnosis not present

## 2016-09-19 DIAGNOSIS — L570 Actinic keratosis: Secondary | ICD-10-CM | POA: Diagnosis not present

## 2016-09-19 DIAGNOSIS — Z1283 Encounter for screening for malignant neoplasm of skin: Secondary | ICD-10-CM | POA: Diagnosis not present

## 2016-09-19 DIAGNOSIS — L820 Inflamed seborrheic keratosis: Secondary | ICD-10-CM | POA: Diagnosis not present

## 2016-09-19 DIAGNOSIS — D229 Melanocytic nevi, unspecified: Secondary | ICD-10-CM | POA: Diagnosis not present

## 2016-09-19 DIAGNOSIS — L219 Seborrheic dermatitis, unspecified: Secondary | ICD-10-CM | POA: Diagnosis not present

## 2016-09-19 DIAGNOSIS — Z85828 Personal history of other malignant neoplasm of skin: Secondary | ICD-10-CM | POA: Diagnosis not present

## 2016-09-19 DIAGNOSIS — L821 Other seborrheic keratosis: Secondary | ICD-10-CM | POA: Diagnosis not present

## 2016-09-19 DIAGNOSIS — L578 Other skin changes due to chronic exposure to nonionizing radiation: Secondary | ICD-10-CM | POA: Diagnosis not present

## 2016-09-20 DIAGNOSIS — M5030 Other cervical disc degeneration, unspecified cervical region: Secondary | ICD-10-CM | POA: Diagnosis not present

## 2016-09-20 DIAGNOSIS — M5412 Radiculopathy, cervical region: Secondary | ICD-10-CM | POA: Diagnosis not present

## 2016-10-11 DIAGNOSIS — M5412 Radiculopathy, cervical region: Secondary | ICD-10-CM | POA: Diagnosis not present

## 2016-10-11 DIAGNOSIS — M19019 Primary osteoarthritis, unspecified shoulder: Secondary | ICD-10-CM | POA: Diagnosis not present

## 2016-10-26 DIAGNOSIS — E11610 Type 2 diabetes mellitus with diabetic neuropathic arthropathy: Secondary | ICD-10-CM | POA: Diagnosis not present

## 2016-10-26 DIAGNOSIS — N401 Enlarged prostate with lower urinary tract symptoms: Secondary | ICD-10-CM

## 2016-10-26 DIAGNOSIS — E785 Hyperlipidemia, unspecified: Secondary | ICD-10-CM | POA: Diagnosis not present

## 2016-10-26 DIAGNOSIS — I10 Essential (primary) hypertension: Secondary | ICD-10-CM

## 2016-10-26 DIAGNOSIS — I4891 Unspecified atrial fibrillation: Secondary | ICD-10-CM | POA: Diagnosis not present

## 2016-10-26 DIAGNOSIS — F323 Major depressive disorder, single episode, severe with psychotic features: Secondary | ICD-10-CM

## 2016-10-26 DIAGNOSIS — G3184 Mild cognitive impairment, so stated: Secondary | ICD-10-CM

## 2016-10-26 DIAGNOSIS — K219 Gastro-esophageal reflux disease without esophagitis: Secondary | ICD-10-CM | POA: Diagnosis not present

## 2016-10-29 DIAGNOSIS — D649 Anemia, unspecified: Secondary | ICD-10-CM | POA: Diagnosis not present

## 2016-10-29 DIAGNOSIS — I10 Essential (primary) hypertension: Secondary | ICD-10-CM | POA: Diagnosis not present

## 2016-10-29 DIAGNOSIS — Z79899 Other long term (current) drug therapy: Secondary | ICD-10-CM | POA: Diagnosis not present

## 2016-10-29 DIAGNOSIS — E119 Type 2 diabetes mellitus without complications: Secondary | ICD-10-CM | POA: Diagnosis not present

## 2016-10-29 DIAGNOSIS — E785 Hyperlipidemia, unspecified: Secondary | ICD-10-CM | POA: Diagnosis not present

## 2016-11-01 DIAGNOSIS — G5601 Carpal tunnel syndrome, right upper limb: Secondary | ICD-10-CM | POA: Diagnosis not present

## 2016-11-08 DIAGNOSIS — M19019 Primary osteoarthritis, unspecified shoulder: Secondary | ICD-10-CM | POA: Diagnosis not present

## 2016-11-29 DIAGNOSIS — L821 Other seborrheic keratosis: Secondary | ICD-10-CM | POA: Diagnosis not present

## 2016-11-29 DIAGNOSIS — L578 Other skin changes due to chronic exposure to nonionizing radiation: Secondary | ICD-10-CM | POA: Diagnosis not present

## 2016-11-29 DIAGNOSIS — L304 Erythema intertrigo: Secondary | ICD-10-CM | POA: Diagnosis not present

## 2016-11-29 DIAGNOSIS — L570 Actinic keratosis: Secondary | ICD-10-CM | POA: Diagnosis not present

## 2016-11-29 DIAGNOSIS — L820 Inflamed seborrheic keratosis: Secondary | ICD-10-CM | POA: Diagnosis not present

## 2016-12-28 DIAGNOSIS — E785 Hyperlipidemia, unspecified: Secondary | ICD-10-CM | POA: Diagnosis not present

## 2016-12-28 DIAGNOSIS — K219 Gastro-esophageal reflux disease without esophagitis: Secondary | ICD-10-CM

## 2016-12-28 DIAGNOSIS — I10 Essential (primary) hypertension: Secondary | ICD-10-CM | POA: Diagnosis not present

## 2016-12-28 DIAGNOSIS — F331 Major depressive disorder, recurrent, moderate: Secondary | ICD-10-CM

## 2016-12-28 DIAGNOSIS — N401 Enlarged prostate with lower urinary tract symptoms: Secondary | ICD-10-CM

## 2016-12-28 DIAGNOSIS — I4891 Unspecified atrial fibrillation: Secondary | ICD-10-CM | POA: Diagnosis not present

## 2016-12-28 DIAGNOSIS — R040 Epistaxis: Secondary | ICD-10-CM

## 2016-12-28 DIAGNOSIS — E119 Type 2 diabetes mellitus without complications: Secondary | ICD-10-CM | POA: Diagnosis not present

## 2016-12-28 DIAGNOSIS — G3184 Mild cognitive impairment, so stated: Secondary | ICD-10-CM

## 2017-01-11 DIAGNOSIS — M6281 Muscle weakness (generalized): Secondary | ICD-10-CM | POA: Diagnosis not present

## 2017-01-11 DIAGNOSIS — R2681 Unsteadiness on feet: Secondary | ICD-10-CM | POA: Diagnosis not present

## 2017-01-12 DIAGNOSIS — M6281 Muscle weakness (generalized): Secondary | ICD-10-CM | POA: Diagnosis not present

## 2017-01-12 DIAGNOSIS — R2681 Unsteadiness on feet: Secondary | ICD-10-CM | POA: Diagnosis not present

## 2017-01-15 DIAGNOSIS — R2681 Unsteadiness on feet: Secondary | ICD-10-CM | POA: Diagnosis not present

## 2017-01-15 DIAGNOSIS — M6281 Muscle weakness (generalized): Secondary | ICD-10-CM | POA: Diagnosis not present

## 2017-01-17 DIAGNOSIS — R2681 Unsteadiness on feet: Secondary | ICD-10-CM | POA: Diagnosis not present

## 2017-01-17 DIAGNOSIS — M6281 Muscle weakness (generalized): Secondary | ICD-10-CM | POA: Diagnosis not present

## 2017-01-18 DIAGNOSIS — M6281 Muscle weakness (generalized): Secondary | ICD-10-CM | POA: Diagnosis not present

## 2017-01-18 DIAGNOSIS — R2681 Unsteadiness on feet: Secondary | ICD-10-CM | POA: Diagnosis not present

## 2017-01-21 DIAGNOSIS — M6281 Muscle weakness (generalized): Secondary | ICD-10-CM | POA: Diagnosis not present

## 2017-01-21 DIAGNOSIS — L821 Other seborrheic keratosis: Secondary | ICD-10-CM | POA: Diagnosis not present

## 2017-01-21 DIAGNOSIS — R2681 Unsteadiness on feet: Secondary | ICD-10-CM | POA: Diagnosis not present

## 2017-01-21 DIAGNOSIS — I700 Atherosclerosis of aorta: Secondary | ICD-10-CM | POA: Diagnosis not present

## 2017-01-24 DIAGNOSIS — R2681 Unsteadiness on feet: Secondary | ICD-10-CM | POA: Diagnosis not present

## 2017-01-24 DIAGNOSIS — M6281 Muscle weakness (generalized): Secondary | ICD-10-CM | POA: Diagnosis not present

## 2017-01-25 DIAGNOSIS — R2681 Unsteadiness on feet: Secondary | ICD-10-CM | POA: Diagnosis not present

## 2017-01-25 DIAGNOSIS — M6281 Muscle weakness (generalized): Secondary | ICD-10-CM | POA: Diagnosis not present

## 2017-01-29 DIAGNOSIS — M6281 Muscle weakness (generalized): Secondary | ICD-10-CM | POA: Diagnosis not present

## 2017-01-29 DIAGNOSIS — R2681 Unsteadiness on feet: Secondary | ICD-10-CM | POA: Diagnosis not present

## 2017-01-31 DIAGNOSIS — R2681 Unsteadiness on feet: Secondary | ICD-10-CM | POA: Diagnosis not present

## 2017-01-31 DIAGNOSIS — M6281 Muscle weakness (generalized): Secondary | ICD-10-CM | POA: Diagnosis not present

## 2017-02-27 DIAGNOSIS — E785 Hyperlipidemia, unspecified: Secondary | ICD-10-CM | POA: Diagnosis not present

## 2017-02-27 DIAGNOSIS — G3184 Mild cognitive impairment, so stated: Secondary | ICD-10-CM | POA: Diagnosis not present

## 2017-02-27 DIAGNOSIS — I10 Essential (primary) hypertension: Secondary | ICD-10-CM | POA: Diagnosis not present

## 2017-02-27 DIAGNOSIS — F323 Major depressive disorder, single episode, severe with psychotic features: Secondary | ICD-10-CM | POA: Diagnosis not present

## 2017-02-27 DIAGNOSIS — E119 Type 2 diabetes mellitus without complications: Secondary | ICD-10-CM | POA: Diagnosis not present

## 2017-02-27 DIAGNOSIS — N401 Enlarged prostate with lower urinary tract symptoms: Secondary | ICD-10-CM | POA: Diagnosis not present

## 2017-02-27 DIAGNOSIS — I4891 Unspecified atrial fibrillation: Secondary | ICD-10-CM | POA: Diagnosis not present

## 2017-02-27 DIAGNOSIS — K219 Gastro-esophageal reflux disease without esophagitis: Secondary | ICD-10-CM | POA: Diagnosis not present

## 2017-03-06 DIAGNOSIS — D1800 Hemangioma unspecified site: Secondary | ICD-10-CM | POA: Diagnosis not present

## 2017-03-06 DIAGNOSIS — B078 Other viral warts: Secondary | ICD-10-CM | POA: Diagnosis not present

## 2017-03-06 DIAGNOSIS — L570 Actinic keratosis: Secondary | ICD-10-CM | POA: Diagnosis not present

## 2017-03-06 DIAGNOSIS — L719 Rosacea, unspecified: Secondary | ICD-10-CM | POA: Diagnosis not present

## 2017-03-06 DIAGNOSIS — L578 Other skin changes due to chronic exposure to nonionizing radiation: Secondary | ICD-10-CM | POA: Diagnosis not present

## 2017-03-06 DIAGNOSIS — L820 Inflamed seborrheic keratosis: Secondary | ICD-10-CM | POA: Diagnosis not present

## 2017-03-06 DIAGNOSIS — L821 Other seborrheic keratosis: Secondary | ICD-10-CM | POA: Diagnosis not present

## 2017-04-11 DIAGNOSIS — R040 Epistaxis: Secondary | ICD-10-CM | POA: Diagnosis not present

## 2017-04-11 DIAGNOSIS — J340 Abscess, furuncle and carbuncle of nose: Secondary | ICD-10-CM | POA: Diagnosis not present

## 2017-04-29 DIAGNOSIS — D649 Anemia, unspecified: Secondary | ICD-10-CM | POA: Diagnosis not present

## 2017-04-29 DIAGNOSIS — E119 Type 2 diabetes mellitus without complications: Secondary | ICD-10-CM | POA: Diagnosis not present

## 2017-04-29 DIAGNOSIS — Z79899 Other long term (current) drug therapy: Secondary | ICD-10-CM | POA: Diagnosis not present

## 2017-04-29 DIAGNOSIS — I10 Essential (primary) hypertension: Secondary | ICD-10-CM | POA: Diagnosis not present

## 2017-04-29 DIAGNOSIS — E785 Hyperlipidemia, unspecified: Secondary | ICD-10-CM | POA: Diagnosis not present

## 2017-05-13 ENCOUNTER — Other Ambulatory Visit (INDEPENDENT_AMBULATORY_CARE_PROVIDER_SITE_OTHER): Payer: Self-pay

## 2017-05-13 ENCOUNTER — Ambulatory Visit (INDEPENDENT_AMBULATORY_CARE_PROVIDER_SITE_OTHER): Payer: Self-pay | Admitting: Vascular Surgery

## 2017-05-13 ENCOUNTER — Encounter (INDEPENDENT_AMBULATORY_CARE_PROVIDER_SITE_OTHER): Payer: Self-pay

## 2017-07-10 DIAGNOSIS — I10 Essential (primary) hypertension: Secondary | ICD-10-CM | POA: Diagnosis not present

## 2017-07-10 DIAGNOSIS — C785 Secondary malignant neoplasm of large intestine and rectum: Secondary | ICD-10-CM | POA: Diagnosis not present

## 2017-07-10 DIAGNOSIS — N401 Enlarged prostate with lower urinary tract symptoms: Secondary | ICD-10-CM | POA: Diagnosis not present

## 2017-07-10 DIAGNOSIS — I4891 Unspecified atrial fibrillation: Secondary | ICD-10-CM | POA: Diagnosis not present

## 2017-07-10 DIAGNOSIS — E1140 Type 2 diabetes mellitus with diabetic neuropathy, unspecified: Secondary | ICD-10-CM | POA: Diagnosis not present

## 2017-07-10 DIAGNOSIS — G3184 Mild cognitive impairment, so stated: Secondary | ICD-10-CM | POA: Diagnosis not present

## 2017-07-10 DIAGNOSIS — K219 Gastro-esophageal reflux disease without esophagitis: Secondary | ICD-10-CM | POA: Diagnosis not present

## 2017-07-10 DIAGNOSIS — F339 Major depressive disorder, recurrent, unspecified: Secondary | ICD-10-CM | POA: Diagnosis not present

## 2017-07-30 ENCOUNTER — Other Ambulatory Visit (INDEPENDENT_AMBULATORY_CARE_PROVIDER_SITE_OTHER): Payer: Self-pay | Admitting: Vascular Surgery

## 2017-07-30 DIAGNOSIS — IMO0001 Reserved for inherently not codable concepts without codable children: Secondary | ICD-10-CM

## 2017-07-30 DIAGNOSIS — T82330D Leakage of aortic (bifurcation) graft (replacement), subsequent encounter: Secondary | ICD-10-CM

## 2017-08-01 ENCOUNTER — Ambulatory Visit (INDEPENDENT_AMBULATORY_CARE_PROVIDER_SITE_OTHER): Payer: Medicare Other | Admitting: Vascular Surgery

## 2017-08-01 ENCOUNTER — Ambulatory Visit (INDEPENDENT_AMBULATORY_CARE_PROVIDER_SITE_OTHER): Payer: Medicare Other

## 2017-08-01 ENCOUNTER — Other Ambulatory Visit (INDEPENDENT_AMBULATORY_CARE_PROVIDER_SITE_OTHER): Payer: Medicare Other

## 2017-08-01 ENCOUNTER — Encounter (INDEPENDENT_AMBULATORY_CARE_PROVIDER_SITE_OTHER): Payer: Self-pay | Admitting: Vascular Surgery

## 2017-08-01 DIAGNOSIS — I714 Abdominal aortic aneurysm, without rupture, unspecified: Secondary | ICD-10-CM

## 2017-08-01 DIAGNOSIS — T82330D Leakage of aortic (bifurcation) graft (replacement), subsequent encounter: Secondary | ICD-10-CM

## 2017-08-01 DIAGNOSIS — E1142 Type 2 diabetes mellitus with diabetic polyneuropathy: Secondary | ICD-10-CM

## 2017-08-01 DIAGNOSIS — I724 Aneurysm of artery of lower extremity: Secondary | ICD-10-CM | POA: Diagnosis not present

## 2017-08-01 DIAGNOSIS — I10 Essential (primary) hypertension: Secondary | ICD-10-CM | POA: Diagnosis not present

## 2017-08-01 DIAGNOSIS — E782 Mixed hyperlipidemia: Secondary | ICD-10-CM | POA: Diagnosis not present

## 2017-08-01 DIAGNOSIS — IMO0001 Reserved for inherently not codable concepts without codable children: Secondary | ICD-10-CM

## 2017-08-01 DIAGNOSIS — Z794 Long term (current) use of insulin: Secondary | ICD-10-CM | POA: Diagnosis not present

## 2017-08-04 DIAGNOSIS — I10 Essential (primary) hypertension: Secondary | ICD-10-CM | POA: Insufficient documentation

## 2017-08-04 DIAGNOSIS — I724 Aneurysm of artery of lower extremity: Secondary | ICD-10-CM | POA: Insufficient documentation

## 2017-08-04 DIAGNOSIS — E785 Hyperlipidemia, unspecified: Secondary | ICD-10-CM | POA: Insufficient documentation

## 2017-08-04 DIAGNOSIS — I714 Abdominal aortic aneurysm, without rupture, unspecified: Secondary | ICD-10-CM | POA: Insufficient documentation

## 2017-08-04 DIAGNOSIS — E119 Type 2 diabetes mellitus without complications: Secondary | ICD-10-CM | POA: Insufficient documentation

## 2017-08-04 NOTE — Progress Notes (Signed)
MRN : [id number]  Benjamin Grant is a 83 y.o. (01/01/1930) male who presents with chief complaint of  Chief Complaint  Patient presents with  . ultrasound follow up  .  History of Present Illness: The patient returns to the office for surveillance of an abdominal aortic aneurysm status post stent graft placement.   Patient denies abdominal pain or back pain, no other abdominal complaints. No groin related complaints. No symptoms consistent with distal embolization No changes in claudication distance.   There have been no interval changes in his overall healthcare since his last visit.   Patient denies amaurosis fugax or TIA symptoms. There is no history of claudication or rest pain symptoms of the lower extremities. The patient denies angina or shortness of breath.   Duplex US of the aorta and iliac arteries shows a 4.0 AAA sac with no endoleak, no significant change in the sac compared to the previous study. Current Meds  Medication Sig  . acetaminophen (TYLENOL) 500 MG tablet Take 500 mg by mouth every 4 (four) hours as needed.  Marland Kitchen aspirin EC 81 MG tablet Take 81 mg by mouth daily.  . citalopram (CELEXA) 10 MG tablet Take 15 mg by mouth daily.  Marland Kitchen dextromethorphan-guaiFENesin (ROBITUSSIN-DM) 10-100 MG/5ML liquid Take by mouth every 4 (four) hours as needed for cough.  . diclofenac sodium (VOLTAREN) 1 % GEL Apply topically as needed.  . digoxin (LANOXIN) 0.125 MG tablet Take by mouth daily.  Marland Kitchen diltiazem (TIAZAC) 180 MG 24 hr capsule Take 180 mg by mouth daily.  Marland Kitchen donepezil (ARICEPT) 10 MG tablet Take 10 mg by mouth at bedtime.  . famotidine (PEPCID) 20 MG tablet Take 20 mg by mouth 2 (two) times daily.  . finasteride (PROSCAR) 5 MG tablet Take 5 mg by mouth daily.  . furosemide (LASIX) 40 MG tablet Take 40 mg by mouth as needed.  Marland Kitchen glipiZIDE (GLUCOTROL) 10 MG tablet Take 10 mg by mouth 2 (two) times daily.  . hydrocortisone (ANUSOL-HC) 2.5 % rectal cream Place 1 application  rectally 2 (two) times daily as needed for hemorrhoids or anal itching.  . insulin glargine (LANTUS) 100 UNIT/ML injection Inject 30 Units into the skin daily.  Marland Kitchen ketoconazole (NIZORAL) 2 % shampoo Apply 1 application topically as directed.  Marland Kitchen lisinopril (PRINIVIL,ZESTRIL) 10 MG tablet Take 10 mg by mouth daily.  . memantine (NAMENDA) 10 MG tablet Take 10 mg by mouth 2 (two) times daily.  . metoprolol tartrate (LOPRESSOR) 25 MG tablet Take 12.5 mg by mouth 2 (two) times daily.  . Multiple Vitamin (THEREMS PO) Take by mouth daily.  . simvastatin (ZOCOR) 10 MG tablet Take 10 mg by mouth daily.  . sitaGLIPtin (JANUVIA) 100 MG tablet Take 100 mg by mouth daily.  . sodium chloride (OCEAN) 0.65 % SOLN nasal spray Place 1 spray into both nostrils as needed for congestion.  . tamsulosin (FLOMAX) 0.4 MG CAPS capsule Take 0.4 mg by mouth daily.    Past Medical History:  Diagnosis Date  . Atrial fibrillation ([place])   . Dementia   . Diabetes mellitus without complication ([place])   . GERD without esophagitis   . Hyperlipidemia   . Hypertension   . Ventricular tachycardia ([place])     No past surgical history on file.  Social History Social History  Substance Use Topics  . Smoking status: Not on file  . Smokeless tobacco: Not on file  . Alcohol use Not on file    Family History No  family history on file.  Allergies  Allergen Reactions  . Penicillins      REVIEW OF SYSTEMS (Negative unless checked)  Constitutional: [] Weight loss  [] Fever  [] Chills Cardiac: [] Chest pain   [] Chest pressure   [] Palpitations   [] Shortness of breath when laying flat   [] Shortness of breath with exertion. Vascular:  [] Pain in legs with walking   [] Pain in legs at rest  [] History of DVT   [] Phlebitis   [] Swelling in legs   [] Varicose veins   [] Non-healing ulcers Pulmonary:   [] Uses home oxygen   [] Productive cough   [] Hemoptysis   [] Wheeze  [] COPD   [] Asthma Neurologic:  [] Dizziness   [] Seizures   [] History of  stroke   [] History of TIA  [] Aphasia   [] Vissual changes   [] Weakness or numbness in arm   [] Weakness or numbness in leg Musculoskeletal:   [] Joint swelling   [] Joint pain   [] Low back pain Hematologic:  [] Easy bruising  [] Easy bleeding   [] Hypercoagulable state   [] Anemic Gastrointestinal:  [] Diarrhea   [] Vomiting  [] Gastroesophageal reflux/heartburn   [] Difficulty swallowing. Genitourinary:  [] Chronic kidney disease   [] Difficult urination  [] Frequent urination   [] Blood in urine Skin:  [] Rashes   [] Ulcers  Psychological:  [] History of anxiety   []  History of major depression.  Physical Examination  There were no vitals filed for this visit. There is no height or weight on file to calculate BMI. Gen: WD/WN, NAD Head: [place]/AT, No temporalis wasting.  Ear/Nose/Throat: Hearing grossly intact, nares w/o erythema or drainage Eyes: PER, EOMI, sclera nonicteric.  Neck: Supple, no large masses.   Pulmonary:  Good air movement, no audible wheezing bilaterally, no use of accessory muscles.  Cardiac: RRR, no JVD Vascular: popliteal artery impulse enlarged Vessel Right Left  Radial Palpable Palpable  Ulnar Palpable Palpable  Brachial Palpable Palpable  Carotid Palpable Palpable  Femoral Palpable Palpable  Popliteal Palpable Palpable  PT Palpable Palpable  DP Palpable Palpable  Gastrointestinal: Non-distended. No guarding/no peritoneal signs.  Musculoskeletal: M/S 5/5 throughout.  No deformity or atrophy.  Neurologic: CN 2-12 intact. Symmetrical.  Speech is fluent. Motor exam as listed above. Psychiatric: Judgment intact, Mood & affect appropriate for pt's clinical situation. Dermatologic: No rashes or ulcers noted.  No changes consistent with cellulitis. Lymph : No lichenification or skin changes of chronic lymphedema.  CBC Lab Results  Component Value Date   WBC 8.6 01/16/2014   HGB 12.3 (L) 01/16/2014   HCT 36.5 (L) 01/16/2014   MCV 89 01/16/2014   PLT 145 (L) 01/16/2014    BMET     Component Value Date/Time   NA 131 (L) 01/17/2014 0359   K 3.9 01/17/2014 0359   CL 100 01/17/2014 0359   CO2 28 01/17/2014 0359   GLUCOSE 245 (H) 01/17/2014 0359   BUN 18 01/17/2014 0359   CREATININE 0.94 01/17/2014 0359   CALCIUM 8.7 01/17/2014 0359   GFRNONAA >60 01/17/2014 0359   GFRAA >60 01/17/2014 0359   CrCl cannot be calculated (Patient's most recent lab result is older than the maximum 21 days allowed.).  COAG Lab Results  Component Value Date   INR 0.9 07/13/2012   INR 0.9 06/04/2012   INR 1.2 02/29/2012    Radiology No results found.  Assessment/Plan 1. AAA (abdominal aortic aneurysm) without rupture (HCC) Recommend: Patient is status post successful endovascular repair of the AAA.   No further intervention is required at this time.   No endoleak is detected and the  aneurysm sac is stable.  The patient will continue antiplatelet therapy as prescribed as well as aggressive management of hyperlipidemia. Exercise is again strongly encouraged.   However, endografts require continued surveillance with ultrasound or CT scan. This is mandatory to detect any changes that allow repressurization of the aneurysm sac.  The patient is informed that this would be asymptomatic.  The patient is reminded that lifelong routine surveillance is a necessity with an endograft. Patient will continue to follow-up at 6 month intervals with ultrasound of the aorta.  - VAS US AORTA/IVC/ILIACS; Future  2. Popliteal artery aneurysm (HCC) Follow with ultrasound at the next appointment  - VAS [place]; Future  3. Type 2 diabetes mellitus with diabetic polyneuropathy, with long-term current use of insulin (HCC) Continue hypoglycemic medications as already ordered, these medications have been reviewed and there are no changes at this time.  Hgb A1C to be monitored as already arranged by primary service   4. Essential hypertension Continue antihypertensive  medications as already ordered, these medications have been reviewed and there are no changes at this time.   5. Mixed hyperlipidemia Continue statin as ordered and reviewed, no changes at this time     Hortencia Pilar, MD  08/04/2017 12:11 PM

## 2017-08-30 DIAGNOSIS — K219 Gastro-esophageal reflux disease without esophagitis: Secondary | ICD-10-CM | POA: Diagnosis not present

## 2017-08-30 DIAGNOSIS — N401 Enlarged prostate with lower urinary tract symptoms: Secondary | ICD-10-CM | POA: Diagnosis not present

## 2017-08-30 DIAGNOSIS — E785 Hyperlipidemia, unspecified: Secondary | ICD-10-CM | POA: Diagnosis not present

## 2017-08-30 DIAGNOSIS — E1140 Type 2 diabetes mellitus with diabetic neuropathy, unspecified: Secondary | ICD-10-CM | POA: Diagnosis not present

## 2017-08-30 DIAGNOSIS — G3184 Mild cognitive impairment, so stated: Secondary | ICD-10-CM | POA: Diagnosis not present

## 2017-08-30 DIAGNOSIS — I4891 Unspecified atrial fibrillation: Secondary | ICD-10-CM | POA: Diagnosis not present

## 2017-08-30 DIAGNOSIS — I10 Essential (primary) hypertension: Secondary | ICD-10-CM | POA: Diagnosis not present

## 2017-10-21 DIAGNOSIS — F0150 Vascular dementia without behavioral disturbance: Secondary | ICD-10-CM | POA: Diagnosis not present

## 2017-10-21 DIAGNOSIS — F39 Unspecified mood [affective] disorder: Secondary | ICD-10-CM | POA: Diagnosis not present

## 2017-10-21 DIAGNOSIS — I480 Paroxysmal atrial fibrillation: Secondary | ICD-10-CM | POA: Diagnosis not present

## 2017-10-21 DIAGNOSIS — E119 Type 2 diabetes mellitus without complications: Secondary | ICD-10-CM | POA: Diagnosis not present

## 2017-10-21 DIAGNOSIS — N400 Enlarged prostate without lower urinary tract symptoms: Secondary | ICD-10-CM | POA: Diagnosis not present

## 2017-11-04 DIAGNOSIS — E119 Type 2 diabetes mellitus without complications: Secondary | ICD-10-CM | POA: Diagnosis not present

## 2017-11-04 DIAGNOSIS — Z79899 Other long term (current) drug therapy: Secondary | ICD-10-CM | POA: Diagnosis not present

## 2017-11-04 DIAGNOSIS — D649 Anemia, unspecified: Secondary | ICD-10-CM | POA: Diagnosis not present

## 2017-11-04 DIAGNOSIS — I10 Essential (primary) hypertension: Secondary | ICD-10-CM | POA: Diagnosis not present

## 2017-11-04 DIAGNOSIS — E785 Hyperlipidemia, unspecified: Secondary | ICD-10-CM | POA: Diagnosis not present

## 2017-11-27 DIAGNOSIS — N401 Enlarged prostate with lower urinary tract symptoms: Secondary | ICD-10-CM | POA: Diagnosis not present

## 2017-11-27 DIAGNOSIS — E119 Type 2 diabetes mellitus without complications: Secondary | ICD-10-CM | POA: Diagnosis not present

## 2017-11-27 DIAGNOSIS — K219 Gastro-esophageal reflux disease without esophagitis: Secondary | ICD-10-CM

## 2017-11-27 DIAGNOSIS — I4891 Unspecified atrial fibrillation: Secondary | ICD-10-CM | POA: Diagnosis not present

## 2017-11-27 DIAGNOSIS — F39 Unspecified mood [affective] disorder: Secondary | ICD-10-CM | POA: Diagnosis not present

## 2017-11-27 DIAGNOSIS — I10 Essential (primary) hypertension: Secondary | ICD-10-CM | POA: Diagnosis not present

## 2017-11-27 DIAGNOSIS — E785 Hyperlipidemia, unspecified: Secondary | ICD-10-CM | POA: Diagnosis not present

## 2017-11-27 DIAGNOSIS — I714 Abdominal aortic aneurysm, without rupture: Secondary | ICD-10-CM | POA: Diagnosis not present

## 2017-11-27 DIAGNOSIS — F0150 Vascular dementia without behavioral disturbance: Secondary | ICD-10-CM | POA: Diagnosis not present

## 2018-02-10 DIAGNOSIS — F0150 Vascular dementia without behavioral disturbance: Secondary | ICD-10-CM | POA: Diagnosis not present

## 2018-02-10 DIAGNOSIS — I480 Paroxysmal atrial fibrillation: Secondary | ICD-10-CM | POA: Diagnosis not present

## 2018-02-10 DIAGNOSIS — F39 Unspecified mood [affective] disorder: Secondary | ICD-10-CM | POA: Diagnosis not present

## 2018-02-10 DIAGNOSIS — E119 Type 2 diabetes mellitus without complications: Secondary | ICD-10-CM | POA: Diagnosis not present

## 2018-02-10 DIAGNOSIS — N400 Enlarged prostate without lower urinary tract symptoms: Secondary | ICD-10-CM | POA: Diagnosis not present

## 2018-02-12 DIAGNOSIS — L309 Dermatitis, unspecified: Secondary | ICD-10-CM | POA: Diagnosis not present

## 2018-02-12 DIAGNOSIS — R21 Rash and other nonspecific skin eruption: Secondary | ICD-10-CM | POA: Diagnosis not present

## 2018-03-12 DIAGNOSIS — L820 Inflamed seborrheic keratosis: Secondary | ICD-10-CM | POA: Diagnosis not present

## 2018-03-12 DIAGNOSIS — Z85828 Personal history of other malignant neoplasm of skin: Secondary | ICD-10-CM | POA: Diagnosis not present

## 2018-03-12 DIAGNOSIS — D229 Melanocytic nevi, unspecified: Secondary | ICD-10-CM | POA: Diagnosis not present

## 2018-03-12 DIAGNOSIS — L853 Xerosis cutis: Secondary | ICD-10-CM | POA: Diagnosis not present

## 2018-03-12 DIAGNOSIS — Z1283 Encounter for screening for malignant neoplasm of skin: Secondary | ICD-10-CM | POA: Diagnosis not present

## 2018-03-12 DIAGNOSIS — L578 Other skin changes due to chronic exposure to nonionizing radiation: Secondary | ICD-10-CM | POA: Diagnosis not present

## 2018-03-12 DIAGNOSIS — L821 Other seborrheic keratosis: Secondary | ICD-10-CM | POA: Diagnosis not present

## 2018-03-12 DIAGNOSIS — D1800 Hemangioma unspecified site: Secondary | ICD-10-CM | POA: Diagnosis not present

## 2018-03-12 DIAGNOSIS — L812 Freckles: Secondary | ICD-10-CM | POA: Diagnosis not present

## 2018-03-13 DIAGNOSIS — N390 Urinary tract infection, site not specified: Secondary | ICD-10-CM | POA: Diagnosis not present

## 2018-03-17 DIAGNOSIS — R310 Gross hematuria: Secondary | ICD-10-CM | POA: Diagnosis not present

## 2018-03-18 ENCOUNTER — Other Ambulatory Visit: Payer: Self-pay

## 2018-03-24 NOTE — Progress Notes (Addendum)
03/25/2018 3:08 PM   Benjamin Grant December 13, 1930 [id number]  Referring provider: Venia Carbon, MD [address] [place], [place] [address]  Chief Complaint  Patient presents with  . Hematuria    HPI: Patient is a 84 -year-old Caucasian male who presents today as a referral from Dr. Silvio Pate for gross hematuria with a caregiver, Benjamin Grant.  He has a poor short term memory.    He saw blood in his urine for two days.   His UA was positive for > 60 RBC's.  He was started on Bactrim.  Urine culture was negative.  Patient doesn't have a prior history of microscopic hematuria.    He is currently on tamsulosin and finasteride.   He does not have a prior history of recurrent urinary tract infections, nephrolithiasis, trauma to the genitourinary tract, BPH or malignancies of the genitourinary tract.   He does not have a family medical history of nephrolithiasis, malignancies of the genitourinary tract or hematuria.   Today, he is having symptoms of mild urgency, nocturia, incontinence, hesitancy, intermittency and a weak urinary stream.  His UA today is negative.   Patient denies any gross hematuria, dysuria or suprapubic/flank pain at this time  Patient denies any fevers, chills, nausea or vomiting at this time.   He has not had any recent imaging studies.   He is a former smoker.  He does not remember how much he smoked.  Quit 40 years ago.  He is not exposed to secondhand smoke.  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   He has HTN.  He has a high BMI.    PMH: Past Medical History:  Diagnosis Date  . Atrial fibrillation ([place])   . Dementia   . Diabetes mellitus without complication ([place])   . GERD without esophagitis   . Hyperlipidemia   . Hypertension   . Ventricular tachycardia [facility])     Surgical History: History reviewed. No pertinent surgical history.  Home Medications:  Allergies as of 03/25/2018      Reactions   Penicillins         Medication List        Accurate as of 03/25/18  3:08 PM. Always use your most recent med list.          acetaminophen 500 MG tablet Commonly known as:  TYLENOL Take 500 mg by mouth every 4 (four) hours as needed.   aspirin EC 81 MG tablet Take 81 mg by mouth daily.   citalopram 10 MG tablet Commonly known as:  CELEXA Take 15 mg by mouth daily.   dextromethorphan-guaiFENesin 10-100 MG/5ML liquid Commonly known as:  ROBITUSSIN-DM Take by mouth every 4 (four) hours as needed for cough.   diclofenac sodium 1 % Gel Commonly known as:  VOLTAREN Apply topically as needed.   digoxin 0.125 MG tablet Commonly known as:  LANOXIN Take by mouth daily.   diltiazem 180 MG 24 hr capsule Commonly known as:  TIAZAC Take 180 mg by mouth daily.   donepezil 10 MG tablet Commonly known as:  ARICEPT Take 10 mg by mouth at bedtime.   famotidine 20 MG tablet Commonly known as:  PEPCID Take 20 mg by mouth 2 (two) times daily.   finasteride 5 MG tablet Commonly known as:  PROSCAR Take 5 mg by mouth daily.   furosemide 40 MG tablet Commonly known as:  LASIX Take 40 mg by mouth as needed.   glipiZIDE 10 MG tablet Commonly  known as:  GLUCOTROL Take 10 mg by mouth 2 (two) times daily.   hydrocortisone 2.5 % rectal cream Commonly known as:  ANUSOL-HC Place 1 application rectally 2 (two) times daily as needed for hemorrhoids or anal itching.   insulin glargine 100 UNIT/ML injection Commonly known as:  LANTUS Inject 30 Units into the skin daily.   ketoconazole 2 % shampoo Commonly known as:  NIZORAL Apply 1 application topically as directed.   lisinopril 10 MG tablet Commonly known as:  PRINIVIL,ZESTRIL Take 10 mg by mouth daily.   memantine 10 MG tablet Commonly known as:  NAMENDA Take 10 mg by mouth 2 (two) times daily.   metoprolol tartrate 25 MG tablet Commonly known as:  LOPRESSOR Take 12.5 mg by mouth 2 (two) times daily.   simvastatin 10 MG tablet Commonly known  as:  ZOCOR Take 10 mg by mouth daily.   sitaGLIPtin 100 MG tablet Commonly known as:  JANUVIA Take 100 mg by mouth daily.   sodium chloride 0.65 % Soln nasal spray Commonly known as:  OCEAN Place 1 spray into both nostrils as needed for congestion.   tamsulosin 0.4 MG Caps capsule Commonly known as:  FLOMAX Take 0.4 mg by mouth daily.   THEREMS PO Take by mouth daily.       Allergies:  Allergies  Allergen Reactions  . Penicillins     Family History: History reviewed. No pertinent family history.  Social History:  reports that he has quit smoking. He has never used smokeless tobacco. He reports that he drank alcohol. He reports that he does not use drugs.  ROS: UROLOGY Frequent Urination?: No Hard to postpone urination?: No Burning/pain with urination?: No Get up at night to urinate?: Yes Leakage of urine?: No Urine stream starts and stops?: No Trouble starting stream?: No Do you have to strain to urinate?: No Blood in urine?: No Urinary tract infection?: No Sexually transmitted disease?: No Injury to kidneys or bladder?: No Painful intercourse?: No Weak stream?: No Erection problems?: No Penile pain?: No  Gastrointestinal Nausea?: No Vomiting?: No Indigestion/heartburn?: No Diarrhea?: No Constipation?: No  Constitutional Fever: No Night sweats?: No Weight loss?: No Fatigue?: No  Skin Skin rash/lesions?: No Itching?: No  Eyes Blurred vision?: No Double vision?: No  Ears/Nose/Throat Sore throat?: No Sinus problems?: No  Hematologic/Lymphatic Swollen glands?: No Easy bruising?: No  Cardiovascular Leg swelling?: Yes Chest pain?: No  Respiratory Cough?: No Shortness of breath?: No  Endocrine Excessive thirst?: No  Musculoskeletal Back pain?: No Joint pain?: No  Neurological Headaches?: No Dizziness?: No  Psychologic Depression?: No Anxiety?: No  Physical Exam: BP (!) 170/71 (BP Location: Right Arm, Patient Position:  Sitting, Cuff Size: Large)   Pulse 66   Ht 5' 10.5" (1.791 m)   Wt 229 lb 8 oz (104.1 kg)   BMI 32.46 kg/m   Constitutional: Well nourished. Alert and oriented, No acute distress. HEENT: [place] AT, moist mucus membranes. Trachea midline, no masses. Cardiovascular: No clubbing, cyanosis, or edema. Respiratory: Normal respiratory effort, no increased work of breathing. GI: Abdomen is soft, non tender, non distended, no abdominal masses. Liver and spleen not palpable.  No hernias appreciated.  Stool sample for occult testing is not indicated.   GU: No CVA tenderness.  No bladder fullness or masses.  Patient with uncircumcised phallus.  Foreskin easily retracted Urethral meatus is patent.  No penile discharge. No penile lesions or rashes. Scrotum without lesions, cysts, rashes and/or edema.  Testicles are located scrotally bilaterally.  No masses are appreciated in the testicles. Left and right epididymis are normal. Rectal: Patient with  normal sphincter tone. Anus and perineum without scarring or rashes. No rectal masses are appreciated. DRE limited to patient's body habitus.  Could only palpate the tip of the prostate.   Skin: No rashes, bruises or suspicious lesions. Lymph: No cervical or inguinal adenopathy. Neurologic: Grossly intact, no focal deficits, moving all 4 extremities. Psychiatric: Normal mood and affect.  Laboratory Data: Lab Results  Component Value Date   WBC 8.6 01/16/2014   HGB 12.3 (L) 01/16/2014   HCT 36.5 (L) 01/16/2014   MCV 89 01/16/2014   PLT 145 (L) 01/16/2014    Lab Results  Component Value Date   CREATININE 0.94 01/17/2014    No results found for: PSA  No results found for: TESTOSTERONE  Lab Results  Component Value Date   HGBA1C 6.8 (H) 03/01/2012    No results found for: TSH  No results found for: CHOL, HDL, CHOLHDL, VLDL, LDLCALC  Lab Results  Component Value Date   AST 20 11/27/2013   Lab Results  Component Value Date   ALT 19 11/27/2013     No components found for: ALKALINEPHOPHATASE No components found for: BILIRUBINTOTAL  No results found for: ESTRADIOL   Urinalysis Negative.  See Epic.    Assessment & Plan:    1. Gross hematuria  - I explained to the patient that there are a number of causes that can be associated with blood in the urine, such as stones, BPH, UTI's, damage to the urinary tract and/or cancer.  - At this time, I felt that the patient warranted further urologic evaluation.   The AUA guidelines state that a CT urogram is the preferred imaging study to evaluate hematuria.  - I explained to the patient that a contrast material will be injected into a vein and that in rare instances, an allergic reaction can result and may even life threatening   The patient denies any allergies to contrast, iodine and/or seafood and is not taking metformin  - Following the imaging study,  I've recommended a cystoscopy. I described how this is performed, typically in an office setting with a flexible cystoscope. We described the risks, benefits, and possible side effects, the most common of which is a minor amount of blood in the urine and/or burning which usually resolves in 24 to 48 hours.    - The patient had the opportunity to ask questions which were answered. Based upon this discussion, the patient is willing to proceed. Therefore, I've ordered: a CT Urogram and cystoscopy.  - The patient will return following all of the above for discussion of the results.   - UA  - Urine culture  - BUN + creatinine    Return for CT Urogram report and cystoscopy.  These notes generated with voice recognition software. I apologize for typographical errors.  Zara Council, [facility] [address] [place], [place] [address] [phone]

## 2018-03-25 ENCOUNTER — Ambulatory Visit (INDEPENDENT_AMBULATORY_CARE_PROVIDER_SITE_OTHER): Payer: Medicare Other | Admitting: Urology

## 2018-03-25 ENCOUNTER — Encounter: Payer: Self-pay | Admitting: Urology

## 2018-03-25 VITALS — BP 170/71 | HR 66 | Ht 70.5 in | Wt 229.5 lb

## 2018-03-25 DIAGNOSIS — R310 Gross hematuria: Secondary | ICD-10-CM | POA: Diagnosis not present

## 2018-03-25 LAB — URINALYSIS, COMPLETE
BILIRUBIN UA: NEGATIVE
KETONES UA: NEGATIVE
Leukocytes, UA: NEGATIVE
NITRITE UA: NEGATIVE
RBC, UA: NEGATIVE
SPEC GRAV UA: 1.025 (ref 1.005–1.030)
UUROB: 1 mg/dL (ref 0.2–1.0)
pH, UA: 5.5 (ref 5.0–7.5)

## 2018-03-25 LAB — MICROSCOPIC EXAMINATION
Bacteria, UA: NONE SEEN
RBC MICROSCOPIC, UA: NONE SEEN /HPF (ref 0–2)
WBC UA: NONE SEEN /HPF (ref 0–5)

## 2018-03-25 NOTE — Patient Instructions (Signed)
Hematuria, Adult Hematuria is blood in your urine. It can be caused by a bladder infection, kidney infection, prostate infection, kidney stone, or cancer of your urinary tract. Infections can usually be treated with medicine, and a kidney stone usually will pass through your urine. If neither of these is the cause of your hematuria, further workup to find out the reason may be needed. It is very important that you tell your health care provider about any blood you see in your urine, even if the blood stops without treatment or happens without causing pain. Blood in your urine that happens and then stops and then happens again can be a symptom of a very serious condition. Also, pain is not a symptom in the initial stages of many urinary cancers. Follow these instructions at home:  Drink lots of fluid, 3-4 quarts a day. If you have been diagnosed with an infection, cranberry juice is especially recommended, in addition to large amounts of water.  Avoid caffeine, tea, and carbonated beverages because they tend to irritate the bladder.  Avoid alcohol because it may irritate the prostate.  Take all medicines as directed by your health care provider.  If you were prescribed an antibiotic medicine, finish it all even if you start to feel better.  If you have been diagnosed with a kidney stone, follow your health care provider's instructions regarding straining your urine to catch the stone.  Empty your bladder often. Avoid holding urine for long periods of time.  After a bowel movement, women should cleanse front to back. Use each tissue only once.  Empty your bladder before and after sexual intercourse if you are a male. Contact a health care provider if:  You develop back pain.  You have a fever.  You have a feeling of sickness in your stomach (nausea) or vomiting.  Your symptoms are not better in 3 days. Return sooner if you are getting worse. Get help right away if:  You develop  severe vomiting and are unable to keep the medicine down.  You develop severe back or abdominal pain despite taking your medicines.  You begin passing a large amount of blood or clots in your urine.  You feel extremely weak or faint, or you pass out. This information is not intended to replace advice given to you by your health care provider. Make sure you discuss any questions you have with your health care provider. Document Released: 12/10/2005 Document Revised: 05/17/2016 Document Reviewed: 08/10/2013 Elsevier Interactive Patient Education  2017 [employer]  CT Scan A CT scan (computed tomography scan) is an imaging scan. It uses X-rays and a computer to make detailed pictures of different areas inside the body. A CT scan can give more information than a regular X-ray exam. A CT scan provides data about internal organs, soft tissue structures, blood vessels, and bones. In this procedure, the pictures will be taken in a large machine that has an opening (CT scanner). Tell a health care provider about:  Any allergies you have.  All medicines you are taking, including vitamins, herbs, eye drops, creams, and over-the-counter medicines.  Any blood disorders you have.  Any surgeries you have had.  Any medical conditions you have.  Whether you are pregnant or may be pregnant. What are the risks? Generally, this is a safe procedure. However, problems may occur, including:  An allergic reaction to dyes.  Development of cancer from excessive exposure to radiation from multiple CT scans. This is rare.  What happens before   the procedure? Staying hydrated Follow instructions from your health care provider about hydration, which may include:  Up to 2 hours before the procedure - you may continue to drink clear liquids, such as water, clear fruit juice, black coffee, and plain tea.  Eating and drinking restrictions Follow instructions from your health care provider about eating and  drinking, which may include:  24 hours before the procedure - stop drinking caffeinated beverages, such as energy drinks, tea, soda, coffee, and hot chocolate.  8 hours before the procedure - stop eating heavy meals or foods such as meat, fried foods, or fatty foods.  6 hours before the procedure - stop eating light meals or foods, such as toast or cereal.  6 hours before the procedure - stop drinking milk or drinks that contain milk.  2 hours before the procedure - stop drinking clear liquids.  General instructions  Remove any jewelry.  Ask your health care provider about changing or stopping your regular medicines. This is especially important if you are taking diabetes medicines or blood thinners. What happens during the procedure?  You will lie on a table with your arms above your head.  An IV tube may be inserted into one of your veins.  The contrast dye may be injected into the IV tube. You may feel warm or have a metallic taste in your mouth.  The table you will be lying on will move into the CT scanner.  You will be able to see, hear, and talk to the person running the machine while you are in it. Follow that person's instructions.  The CT scanner will move around you to take pictures. Do not move while it is scanning. Staying still helps the scanner to get a good image.  When the best possible pictures have been taken, the machine will be turned off. The table will be moved out of the machine.  The IV tube will be removed. The procedure may vary among health care providers and hospitals. What happens after the procedure?  It is up to you to get the results of your procedure. Ask your health care provider, or the department that is doing the procedure, when your results will be ready. Summary  A CT scan is an imaging scan.  A CT scan uses X-rays and a computer to make detailed pictures of different areas of your body.  Follow instructions from your health care  provider about eating and drinking before the procedure.  You will be able to see, hear, and talk to the person running the machine while you are in it. Follow that person's instructions. This information is not intended to replace advice given to you by your health care provider. Make sure you discuss any questions you have with your health care provider. Document Released: 01/17/2005 Document Revised: 01/12/2017 Document Reviewed: 01/12/2017 Elsevier Interactive Patient Education  2018 [employer].  Cystoscopy Cystoscopy is a procedure that is used to help diagnose and sometimes treat conditions that affect that lower urinary tract. The lower urinary tract includes the bladder and the tube that drains urine from the bladder out of the body (urethra). Cystoscopy is performed with a thin, tube-shaped instrument with a light and camera at the end (cystoscope). The cystoscope may be hard (rigid) or flexible, depending on the goal of the procedure.The cystoscope is inserted through the urethra, into the bladder. Cystoscopy may be recommended if you have:  Urinary tractinfections that keep coming back (recurring).  Blood in the urine (hematuria).    Loss of bladder control (urinary incontinence) or an overactive bladder.  Unusual cells found in a urine sample.  A blockage in the urethra.  Painful urination.  An abnormality in the bladder found during an intravenous pyelogram (IVP) or CT scan.  Cystoscopy may also be done to remove a sample of tissue to be examined under a microscope (biopsy). Tell a health care provider about:  Any allergies you have.  All medicines you are taking, including vitamins, herbs, eye drops, creams, and over-the-counter medicines.  Any problems you or family members have had with anesthetic medicines.  Any blood disorders you have.  Any surgeries you have had.  Any medical conditions you have.  Whether you are pregnant or may be pregnant. What are the  risks? Generally, this is a safe procedure. However, problems may occur, including:  Infection.  Bleeding.  Allergic reactions to medicines.  Damage to other structures or organs.  What happens before the procedure?  Ask your health care provider about: ? Changing or stopping your regular medicines. This is especially important if you are taking diabetes medicines or blood thinners. ? Taking medicines such as aspirin and ibuprofen. These medicines can thin your blood. Do not take these medicines before your procedure if your health care provider instructs you not to.  Follow instructions from your health care provider about eating or drinking restrictions.  You may be given antibiotic medicine to help prevent infection.  You may have an exam or testing, such as X-rays of the bladder, urethra, or kidneys.  You may have urine tests to check for signs of infection.  Plan to have someone take you home after the procedure. What happens during the procedure?  To reduce your risk of infection,your health care team will wash or sanitize their hands.  You will be given one or more of the following: ? A medicine to help you relax (sedative). ? A medicine to numb the area (local anesthetic).  The area around the opening of your urethra will be cleaned.  The cystoscope will be passed through your urethra into your bladder.  Germ-free (sterile)fluid will flow through the cystoscope to fill your bladder. The fluid will stretch your bladder so that your surgeon can clearly examine your bladder walls.  The cystoscope will be removed and your bladder will be emptied. The procedure may vary among health care providers and hospitals. What happens after the procedure?  You may have some soreness or pain in your abdomen and urethra. Medicines will be available to help you.  You may have some blood in your urine.  Do not drive for 24 hours if you received a sedative. This information is  not intended to replace advice given to you by your health care provider. Make sure you discuss any questions you have with your health care provider. Document Released: 12/07/2000 Document Revised: 04/19/2016 Document Reviewed: 10/27/2015 Elsevier Interactive Patient Education  2018 Elsevier Inc.  

## 2018-03-26 DIAGNOSIS — K219 Gastro-esophageal reflux disease without esophagitis: Secondary | ICD-10-CM | POA: Diagnosis not present

## 2018-03-26 DIAGNOSIS — E119 Type 2 diabetes mellitus without complications: Secondary | ICD-10-CM | POA: Diagnosis not present

## 2018-03-26 DIAGNOSIS — R319 Hematuria, unspecified: Secondary | ICD-10-CM | POA: Diagnosis not present

## 2018-03-26 DIAGNOSIS — N401 Enlarged prostate with lower urinary tract symptoms: Secondary | ICD-10-CM | POA: Diagnosis not present

## 2018-03-26 DIAGNOSIS — F0150 Vascular dementia without behavioral disturbance: Secondary | ICD-10-CM | POA: Diagnosis not present

## 2018-03-26 DIAGNOSIS — E785 Hyperlipidemia, unspecified: Secondary | ICD-10-CM | POA: Diagnosis not present

## 2018-03-26 DIAGNOSIS — F39 Unspecified mood [affective] disorder: Secondary | ICD-10-CM | POA: Diagnosis not present

## 2018-03-26 DIAGNOSIS — I10 Essential (primary) hypertension: Secondary | ICD-10-CM | POA: Diagnosis not present

## 2018-03-26 DIAGNOSIS — I4891 Unspecified atrial fibrillation: Secondary | ICD-10-CM | POA: Diagnosis not present

## 2018-03-26 LAB — BUN+CREAT
BUN/Creatinine Ratio: 14 (ref 10–24)
BUN: 13 mg/dL (ref 8–27)
CREATININE: 0.9 mg/dL (ref 0.76–1.27)
GFR calc Af Amer: 90 mL/min/{1.73_m2} (ref >59)
GFR calc non Af Amer: 78 mL/min/{1.73_m2} (ref >59)

## 2018-03-27 DIAGNOSIS — R319 Hematuria, unspecified: Secondary | ICD-10-CM | POA: Diagnosis not present

## 2018-03-27 LAB — CULTURE, URINE COMPREHENSIVE

## 2018-04-10 ENCOUNTER — Ambulatory Visit
Admission: RE | Admit: 2018-04-10 | Discharge: 2018-04-10 | Disposition: A | Discharge status: Home / Self Care | Payer: Medicare Other | Source: Ambulatory Visit | Attending: Urology | Admitting: Urology

## 2018-04-10 DIAGNOSIS — I714 Abdominal aortic aneurysm, without rupture: Secondary | ICD-10-CM | POA: Insufficient documentation

## 2018-04-10 DIAGNOSIS — N329 Bladder disorder, unspecified: Secondary | ICD-10-CM | POA: Insufficient documentation

## 2018-04-10 DIAGNOSIS — K8020 Calculus of gallbladder without cholecystitis without obstruction: Secondary | ICD-10-CM | POA: Diagnosis not present

## 2018-04-10 DIAGNOSIS — R319 Hematuria, unspecified: Secondary | ICD-10-CM | POA: Diagnosis not present

## 2018-04-10 DIAGNOSIS — I700 Atherosclerosis of aorta: Secondary | ICD-10-CM | POA: Diagnosis not present

## 2018-04-10 DIAGNOSIS — K4090 Unilateral inguinal hernia, without obstruction or gangrene, not specified as recurrent: Secondary | ICD-10-CM | POA: Insufficient documentation

## 2018-04-10 DIAGNOSIS — R310 Gross hematuria: Secondary | ICD-10-CM | POA: Diagnosis not present

## 2018-04-10 DIAGNOSIS — N400 Enlarged prostate without lower urinary tract symptoms: Secondary | ICD-10-CM | POA: Insufficient documentation

## 2018-04-10 MED ORDER — IOPAMIDOL (ISOVUE-300) INJECTION 61%
125.0000 mL | Freq: Once | INTRAVENOUS | Status: AC | PRN
  Administered 2018-04-10: 125 mL via INTRAVENOUS

## 2018-04-23 ENCOUNTER — Ambulatory Visit (INDEPENDENT_AMBULATORY_CARE_PROVIDER_SITE_OTHER): Payer: Medicare Other | Admitting: Urology

## 2018-04-23 ENCOUNTER — Encounter: Payer: Self-pay | Admitting: Urology

## 2018-04-23 VITALS — BP 168/84 | HR 67 | Resp 16 | Wt 234.0 lb

## 2018-04-23 DIAGNOSIS — R310 Gross hematuria: Secondary | ICD-10-CM

## 2018-04-23 LAB — URINALYSIS, COMPLETE
Bilirubin, UA: NEGATIVE
Ketones, UA: NEGATIVE
LEUKOCYTES UA: NEGATIVE
Nitrite, UA: NEGATIVE
PH UA: 5.5 (ref 5.0–7.5)
RBC UA: NEGATIVE
Specific Gravity, UA: 1.025 (ref 1.005–1.030)
Urobilinogen, Ur: 1 mg/dL (ref 0.2–1.0)

## 2018-04-23 LAB — MICROSCOPIC EXAMINATION

## 2018-04-23 MED ORDER — LIDOCAINE HCL URETHRAL/MUCOSAL 2 % EX GEL
1.0000 "application " | Freq: Once | CUTANEOUS | Status: AC
  Administered 2018-04-23: 1 via URETHRAL

## 2018-04-23 MED ORDER — CIPROFLOXACIN HCL 500 MG PO TABS
500.0000 mg | ORAL_TABLET | Freq: Once | ORAL | Status: AC
  Administered 2018-04-23: 500 mg via ORAL

## 2018-04-23 NOTE — Progress Notes (Signed)
   04/23/18  CC: No chief complaint on file.   HPI: 84 year old male previously seen by Zara Council for an episode of gross hematuria.  CT urogram shows no significant upper tract abnormalities.  He was incidentally noted to have a possible filling defect on the left bladder wall.  Blood pressure (!) 168/84, pulse 67, resp. rate 16, weight 234 lb (106.1 kg), SpO2 98 %. NED. A&Ox3.   No respiratory distress   Abd soft, NT, ND Normal phallus with bilateral descended testicles  Cystoscopy Procedure Note  Patient identification was confirmed, informed consent was obtained, and patient was prepped using Betadine solution.  Lidocaine jelly was administered per urethral meatus.    Preoperative abx where received prior to procedure.     Pre-Procedure: - Inspection reveals a normal caliber ureteral meatus.  Procedure: The flexible cystoscope was introduced without difficulty - No urethral strictures/lesions are present. - Lateral lobe enlargement with a prominent median lobe prostate  - Bilateral ureteral orifices identified - Bladder mucosa  reveals an ~ 1cm papillary lesion just lateral to the left ureteral orifice.  No other lesions identified - No bladder stones -3+ trabeculation with multiple cellules  Retroflexion shows no significant abnormalities   Post-Procedure: - Patient tolerated the procedure well  Assessment/ Plan: 84 year old male with gross hematuria and a papillary bladder tumor endoscopically consistent with superficial urothelial carcinoma.  Findings were discussed with patient and his caregiver.  Have recommended scheduling a TURBT.  The procedure were discussed including potential risks of bleeding, infection and rarely bladder perforation.  He indicated all questions were answered and desires to proceed.  His healthcare power of attorney was not present at the time of this visit.

## 2018-04-25 ENCOUNTER — Other Ambulatory Visit: Payer: Self-pay | Admitting: Radiology

## 2018-04-25 DIAGNOSIS — N329 Bladder disorder, unspecified: Secondary | ICD-10-CM

## 2018-04-29 ENCOUNTER — Other Ambulatory Visit: Payer: Self-pay | Admitting: Radiology

## 2018-05-02 ENCOUNTER — Encounter
Admission: RE | Admit: 2018-05-02 | Discharge: 2018-05-02 | Disposition: A | Discharge status: Home / Self Care | Payer: Medicare Other | Source: Ambulatory Visit | Attending: Urology | Admitting: Urology

## 2018-05-02 ENCOUNTER — Other Ambulatory Visit: Payer: Self-pay

## 2018-05-02 DIAGNOSIS — I440 Atrioventricular block, first degree: Secondary | ICD-10-CM | POA: Diagnosis not present

## 2018-05-02 DIAGNOSIS — Z01812 Encounter for preprocedural laboratory examination: Secondary | ICD-10-CM | POA: Diagnosis not present

## 2018-05-02 DIAGNOSIS — R9431 Abnormal electrocardiogram [ECG] [EKG]: Secondary | ICD-10-CM | POA: Insufficient documentation

## 2018-05-02 DIAGNOSIS — I10 Essential (primary) hypertension: Secondary | ICD-10-CM | POA: Insufficient documentation

## 2018-05-02 DIAGNOSIS — Z01818 Encounter for other preprocedural examination: Secondary | ICD-10-CM | POA: Insufficient documentation

## 2018-05-02 DIAGNOSIS — E119 Type 2 diabetes mellitus without complications: Secondary | ICD-10-CM | POA: Insufficient documentation

## 2018-05-02 DIAGNOSIS — R001 Bradycardia, unspecified: Secondary | ICD-10-CM | POA: Insufficient documentation

## 2018-05-02 DIAGNOSIS — Z01810 Encounter for preprocedural cardiovascular examination: Secondary | ICD-10-CM | POA: Diagnosis not present

## 2018-05-02 HISTORY — DX: Abdominal aortic aneurysm, without rupture, unspecified: I71.40

## 2018-05-02 HISTORY — DX: Depression, unspecified: F32.A

## 2018-05-02 HISTORY — DX: Major depressive disorder, single episode, unspecified: F32.9

## 2018-05-02 HISTORY — DX: Aneurysm of unspecified site: I72.9

## 2018-05-02 HISTORY — DX: Abdominal aortic aneurysm, without rupture: I71.4

## 2018-05-02 HISTORY — DX: Diverticulosis of large intestine without perforation or abscess with bleeding: K57.31

## 2018-05-02 HISTORY — DX: Gross hematuria: R31.0

## 2018-05-02 LAB — BASIC METABOLIC PANEL
Anion gap: 8 (ref 5–15)
BUN: 16 mg/dL (ref 6–20)
CHLORIDE: 101 mmol/L (ref 101–111)
CO2: 27 mmol/L (ref 22–32)
Calcium: 9.3 mg/dL (ref 8.9–10.3)
Creatinine, Ser: 0.8 mg/dL (ref 0.61–1.24)
Glucose, Bld: 210 mg/dL — ABNORMAL HIGH (ref 65–99)
POTASSIUM: 3.7 mmol/L (ref 3.5–5.1)
SODIUM: 136 mmol/L (ref 135–145)

## 2018-05-02 NOTE — Patient Instructions (Signed)
Your procedure is scheduled on: May 09, 2018 Friday Report to Day Surgery on the 2nd floor of the [facility]. To find out your arrival time, please call [phone] between 1PM - 3PM on: May 08, 2018 Thursday   REMEMBER: Instructions that are not followed completely may result in serious medical risk, up to and including death; or upon the discretion of your surgeon and anesthesiologist your surgery may need to be rescheduled.  Do not eat food after midnight the night before your procedure.  No gum chewing, lozengers or hard candies.  You may however, drink CLEAR liquids up to 2 hours before you are scheduled to arrive for your surgery. Do not drink anything within 2 hours of the start of your surgery.  Clear liquids include: - water   Do NOT drink anything that is not on this list.  Type 1 and Type 2 diabetics should only drink water.  No Alcohol for 24 hours before or after surgery.  No Smoking including e-cigarettes for 24 hours prior to surgery.  No chewable tobacco products for at least 6 hours prior to surgery.  No nicotine patches on the day of surgery.  On the morning of surgery brush your teeth with toothpaste and water, you may rinse your mouth with mouthwash if you wish. Do not swallow any toothpaste or mouthwash.  Notify your doctor if there is any change in your medical condition (cold, fever, infection).  Do not wear jewelry, make-up, hairpins, clips or nail polish.  Do not wear lotions, powders, or perfumes. You may wear deodorant.  Do not shave 48 hours prior to surgery. Men may shave face and neck.  Contacts and dentures may not be worn into surgery.  Do not bring valuables to the hospital, including drivers license, insurance or credit cards.  [facility] is not responsible for any belongings or valuables.   TAKE THESE MEDICATIONS THE MORNING OF SURGERY: TAMULOSIN FINESTERIDE FAMOTIDINE DIGOXIN DILTIAZEM METOPROLOL MEMANTINE  Bring your C-PAP  to the hospital with you in case you may have to spend the night.    Take 1/2 of usual insulin dose the night before surgery and none on the morning of surgery.  Follow recommendations from Cardiologist, Pulmonologist or PCP regarding stopping Aspirin, Coumadin, Plavix, Eliquis, Pradaxa, or Pletal.  HOLD ASPIRIN FOR 7 DAYS BEFORE SURGERY LAST DOSE TODAY  Stop Anti-inflammatories (NSAIDS) such as Advil, Aleve, Ibuprofen, Motrin, Naproxen, Naprosyn and Aspirin based products such as Excedrin, Goodys Powder, BC Powder. (May take Tylenol or Acetaminophen if needed.)  Stop ANY OVER THE COUNTER supplements until after surgery. (May continue Vitamin D, Vitamin B, and multivitamin.)  Wear comfortable clothing (specific to your surgery type) to the hospital.  Plan for stool softeners for home use.   If you are being discharged the day of surgery, you will not be allowed to drive home. You will need a responsible adult to drive you home and stay with you that night.   If you are taking public transportation, you will need to have a responsible adult with you. Please confirm with your physician that it is acceptable to use public transportation.   Please call [phone] if you have any questions about these instructions.

## 2018-05-04 LAB — URINE CULTURE

## 2018-05-05 ENCOUNTER — Other Ambulatory Visit: Payer: Self-pay | Admitting: Radiology

## 2018-05-05 ENCOUNTER — Other Ambulatory Visit: Payer: 59

## 2018-05-05 ENCOUNTER — Telehealth: Payer: Self-pay | Admitting: Radiology

## 2018-05-05 DIAGNOSIS — C679 Malignant neoplasm of bladder, unspecified: Secondary | ICD-10-CM | POA: Diagnosis not present

## 2018-05-05 DIAGNOSIS — E119 Type 2 diabetes mellitus without complications: Secondary | ICD-10-CM | POA: Diagnosis not present

## 2018-05-05 DIAGNOSIS — E785 Hyperlipidemia, unspecified: Secondary | ICD-10-CM | POA: Diagnosis not present

## 2018-05-05 DIAGNOSIS — Z79899 Other long term (current) drug therapy: Secondary | ICD-10-CM | POA: Diagnosis not present

## 2018-05-05 DIAGNOSIS — N329 Bladder disorder, unspecified: Secondary | ICD-10-CM

## 2018-05-05 DIAGNOSIS — I10 Essential (primary) hypertension: Secondary | ICD-10-CM | POA: Diagnosis not present

## 2018-05-05 DIAGNOSIS — D649 Anemia, unspecified: Secondary | ICD-10-CM | POA: Diagnosis not present

## 2018-05-05 NOTE — Telephone Encounter (Signed)
See if he can bring in a urine either today or tomorrow with the possibility he may need to be canceled if the culture is positive.  If this is not possible then would recommend canceling the surgery until the culture can be repeated

## 2018-05-05 NOTE — Telephone Encounter (Signed)
Spoke with Clarene Critchley, pt's nurse at [facility]. Clarene Critchley states they may not be able to arrange transportation for cathed urine culture in the office today but states she can do it there & bring the sample to the office. Expressed need for urine to be collected with a catheter. Clarene Critchley voices understanding & states she will bring the specimen to the office today.

## 2018-05-05 NOTE — Telephone Encounter (Signed)
Result Notes for Urine Culture   Notes recorded by Abbie Sons, MD on 05/05/2018 at 9:04 AM EDT Recommend repeating the urine culture 1 week prior to procedure.     This was done 1 week prior to procedure. Surgery is scheduled 05/09/2018. Please advise.

## 2018-05-05 NOTE — Pre-Procedure Instructions (Signed)
UC RESULTS FAXED TO DR STOIOFF

## 2018-05-07 LAB — CULTURE, URINE COMPREHENSIVE

## 2018-05-08 ENCOUNTER — Telehealth: Payer: Self-pay | Admitting: Radiology

## 2018-05-08 MED ORDER — GEMCITABINE HCL CHEMO INJECTION 1 GM
2000.0000 mg | Freq: Once | INTRAVENOUS | Status: AC
  Administered 2018-05-09: 2000 mg
  Filled 2018-05-08: qty 53

## 2018-05-08 MED ORDER — CIPROFLOXACIN IN D5W 400 MG/200ML IV SOLN
400.0000 mg | INTRAVENOUS | Status: AC
  Administered 2018-05-09: 400 mg via INTRAVENOUS

## 2018-05-08 NOTE — Telephone Encounter (Signed)
-----   Message from Abbie Sons, MD sent at 05/08/2018 10:02 AM EDT ----- Culture grew lactobacillus.  Okay for surgery tomorrow

## 2018-05-09 ENCOUNTER — Encounter: Payer: Self-pay | Admitting: Anesthesiology

## 2018-05-09 ENCOUNTER — Ambulatory Visit
Admission: RE | Admit: 2018-05-09 | Discharge: 2018-05-09 | Disposition: A | Discharge status: Home / Self Care | Payer: Medicare Other | Source: Ambulatory Visit | Attending: Urology | Admitting: Urology

## 2018-05-09 ENCOUNTER — Ambulatory Visit: Payer: Medicare Other | Admitting: Anesthesiology

## 2018-05-09 ENCOUNTER — Encounter
Admission: RE | Disposition: A | Discharge status: Home / Self Care | Payer: Self-pay | Source: Ambulatory Visit | Attending: Urology

## 2018-05-09 ENCOUNTER — Other Ambulatory Visit: Payer: Self-pay

## 2018-05-09 DIAGNOSIS — I700 Atherosclerosis of aorta: Secondary | ICD-10-CM | POA: Insufficient documentation

## 2018-05-09 DIAGNOSIS — N342 Other urethritis: Secondary | ICD-10-CM | POA: Diagnosis not present

## 2018-05-09 DIAGNOSIS — F329 Major depressive disorder, single episode, unspecified: Secondary | ICD-10-CM | POA: Diagnosis not present

## 2018-05-09 DIAGNOSIS — N329 Bladder disorder, unspecified: Secondary | ICD-10-CM | POA: Diagnosis not present

## 2018-05-09 DIAGNOSIS — N411 Chronic prostatitis: Secondary | ICD-10-CM | POA: Insufficient documentation

## 2018-05-09 DIAGNOSIS — N3289 Other specified disorders of bladder: Secondary | ICD-10-CM | POA: Insufficient documentation

## 2018-05-09 DIAGNOSIS — Z7982 Long term (current) use of aspirin: Secondary | ICD-10-CM | POA: Diagnosis not present

## 2018-05-09 DIAGNOSIS — N323 Diverticulum of bladder: Secondary | ICD-10-CM | POA: Diagnosis not present

## 2018-05-09 DIAGNOSIS — I724 Aneurysm of artery of lower extremity: Secondary | ICD-10-CM | POA: Diagnosis not present

## 2018-05-09 DIAGNOSIS — Z87891 Personal history of nicotine dependence: Secondary | ICD-10-CM | POA: Insufficient documentation

## 2018-05-09 DIAGNOSIS — F0390 Unspecified dementia without behavioral disturbance: Secondary | ICD-10-CM | POA: Diagnosis not present

## 2018-05-09 DIAGNOSIS — K219 Gastro-esophageal reflux disease without esophagitis: Secondary | ICD-10-CM | POA: Insufficient documentation

## 2018-05-09 DIAGNOSIS — I4891 Unspecified atrial fibrillation: Secondary | ICD-10-CM | POA: Insufficient documentation

## 2018-05-09 DIAGNOSIS — E785 Hyperlipidemia, unspecified: Secondary | ICD-10-CM | POA: Diagnosis not present

## 2018-05-09 DIAGNOSIS — Z880 Allergy status to penicillin: Secondary | ICD-10-CM | POA: Insufficient documentation

## 2018-05-09 DIAGNOSIS — I491 Atrial premature depolarization: Secondary | ICD-10-CM | POA: Diagnosis not present

## 2018-05-09 DIAGNOSIS — Z79899 Other long term (current) drug therapy: Secondary | ICD-10-CM | POA: Diagnosis not present

## 2018-05-09 DIAGNOSIS — Z794 Long term (current) use of insulin: Secondary | ICD-10-CM | POA: Insufficient documentation

## 2018-05-09 DIAGNOSIS — C679 Malignant neoplasm of bladder, unspecified: Secondary | ICD-10-CM | POA: Insufficient documentation

## 2018-05-09 DIAGNOSIS — N400 Enlarged prostate without lower urinary tract symptoms: Secondary | ICD-10-CM | POA: Diagnosis not present

## 2018-05-09 DIAGNOSIS — I714 Abdominal aortic aneurysm, without rupture: Secondary | ICD-10-CM | POA: Diagnosis not present

## 2018-05-09 DIAGNOSIS — D494 Neoplasm of unspecified behavior of bladder: Secondary | ICD-10-CM

## 2018-05-09 DIAGNOSIS — E1151 Type 2 diabetes mellitus with diabetic peripheral angiopathy without gangrene: Secondary | ICD-10-CM | POA: Diagnosis not present

## 2018-05-09 DIAGNOSIS — I10 Essential (primary) hypertension: Secondary | ICD-10-CM | POA: Insufficient documentation

## 2018-05-09 HISTORY — PX: TRANSURETHRAL RESECTION OF BLADDER TUMOR WITH MITOMYCIN-C: SHX6459

## 2018-05-09 LAB — GLUCOSE, CAPILLARY
GLUCOSE-CAPILLARY: 255 mg/dL — AB (ref 65–99)
Glucose-Capillary: 175 mg/dL — ABNORMAL HIGH (ref 65–99)
Glucose-Capillary: 207 mg/dL — ABNORMAL HIGH (ref 65–99)

## 2018-05-09 SURGERY — TRANSURETHRAL RESECTION OF BLADDER TUMOR WITH MITOMYCIN-C
Anesthesia: General | Site: Bladder | Wound class: Clean Contaminated

## 2018-05-09 MED ORDER — EPHEDRINE SULFATE 50 MG/ML IJ SOLN
INTRAMUSCULAR | Status: DC | PRN
  Administered 2018-05-09: 10 mg via INTRAVENOUS

## 2018-05-09 MED ORDER — LACTATED RINGERS IV SOLN
INTRAVENOUS | Status: DC | PRN
  Administered 2018-05-09: 10:00:00 via INTRAVENOUS

## 2018-05-09 MED ORDER — LIDOCAINE HCL (CARDIAC) PF 100 MG/5ML IV SOSY
PREFILLED_SYRINGE | INTRAVENOUS | Status: DC | PRN
  Administered 2018-05-09: 30 mg via INTRAVENOUS

## 2018-05-09 MED ORDER — PROPOFOL 10 MG/ML IV BOLUS
INTRAVENOUS | Status: DC | PRN
  Administered 2018-05-09: 150 mg via INTRAVENOUS

## 2018-05-09 MED ORDER — FENTANYL CITRATE (PF) 100 MCG/2ML IJ SOLN: 25.0000 ug | INTRAMUSCULAR | Status: DC | PRN

## 2018-05-09 MED ORDER — ONDANSETRON HCL 4 MG/2ML IJ SOLN
INTRAMUSCULAR | Status: DC | PRN
  Administered 2018-05-09: 4 mg via INTRAVENOUS

## 2018-05-09 MED ORDER — CIPROFLOXACIN IN D5W 400 MG/200ML IV SOLN
INTRAVENOUS | Status: AC
  Filled 2018-05-09: qty 200

## 2018-05-09 MED ORDER — GLYCOPYRROLATE 0.2 MG/ML IJ SOLN
INTRAMUSCULAR | Status: DC | PRN
  Administered 2018-05-09: 0.2 mg via INTRAVENOUS

## 2018-05-09 MED ORDER — ONDANSETRON HCL 4 MG/2ML IJ SOLN
INTRAMUSCULAR | Status: AC
  Filled 2018-05-09: qty 2

## 2018-05-09 MED ORDER — SODIUM CHLORIDE 0.9 % IV SOLN
INTRAVENOUS | Status: DC
  Administered 2018-05-09: 09:00:00 via INTRAVENOUS

## 2018-05-09 MED ORDER — FENTANYL CITRATE (PF) 100 MCG/2ML IJ SOLN
INTRAMUSCULAR | Status: DC | PRN
  Administered 2018-05-09 (×2): 50 ug via INTRAVENOUS

## 2018-05-09 MED ORDER — ONDANSETRON HCL 4 MG/2ML IJ SOLN
4.0000 mg | Freq: Once | INTRAMUSCULAR | Status: DC | PRN
Start: 2018-05-09 — End: 2018-05-09

## 2018-05-09 MED ORDER — EPHEDRINE SULFATE 50 MG/ML IJ SOLN
INTRAMUSCULAR | Status: AC
  Filled 2018-05-09: qty 1

## 2018-05-09 MED ORDER — FENTANYL CITRATE (PF) 100 MCG/2ML IJ SOLN
INTRAMUSCULAR | Status: AC
  Filled 2018-05-09: qty 2

## 2018-05-09 SURGICAL SUPPLY — 25 items
BAG DRAIN CYSTO-URO LG1000N (MISCELLANEOUS) ×3 IMPLANT
BAG URINE DRAINAGE (UROLOGICAL SUPPLIES) ×3 IMPLANT
CATH FOLEY 2WAY  5CC 16FR (CATHETERS) ×2
CATH URTH 16FR FL 2W BLN LF (CATHETERS) ×1 IMPLANT
DRAPE UTILITY 15X26 TOWEL STRL (DRAPES) ×3 IMPLANT
DRSG TELFA 4X3 1S NADH ST (GAUZE/BANDAGES/DRESSINGS) ×3 IMPLANT
ELECT LOOP 22F BIPOLAR SML (ELECTROSURGICAL) ×3
ELECT REM PT RETURN 9FT ADLT (ELECTROSURGICAL)
ELECTRODE LOOP 22F BIPOLAR SML (ELECTROSURGICAL) ×1 IMPLANT
ELECTRODE REM PT RTRN 9FT ADLT (ELECTROSURGICAL) IMPLANT
GLOVE BIO SURGEON STRL SZ8 (GLOVE) ×3 IMPLANT
GOWN STRL REUS W/ TWL LRG LVL3 (GOWN DISPOSABLE) ×1 IMPLANT
GOWN STRL REUS W/TWL LRG LVL3 (GOWN DISPOSABLE) ×2
GOWN STRL REUS W/TWL XL LVL4 (GOWN DISPOSABLE) ×3 IMPLANT
KIT TURNOVER CYSTO (KITS) ×3 IMPLANT
LOOP CUT BIPOLAR 24F LRG (ELECTROSURGICAL) IMPLANT
NDL SAFETY ECLIPSE 18X1.5 (NEEDLE) ×1 IMPLANT
NEEDLE HYPO 18GX1.5 SHARP (NEEDLE) ×2
PACK CYSTO AR (MISCELLANEOUS) ×3 IMPLANT
SCRUB POVIDONE IODINE 4 OZ (MISCELLANEOUS) ×3 IMPLANT
SET IRRIG Y TYPE TUR BLADDER L (SET/KITS/TRAYS/PACK) ×3 IMPLANT
SET IRRIGATING DISP (SET/KITS/TRAYS/PACK) ×3 IMPLANT
SURGILUBE 2OZ TUBE FLIPTOP (MISCELLANEOUS) ×3 IMPLANT
SYRINGE IRR TOOMEY STRL 70CC (SYRINGE) ×3 IMPLANT
WATER STERILE IRR 1000ML POUR (IV SOLUTION) ×3 IMPLANT

## 2018-05-09 NOTE — Transfer of Care (Addendum)
Immediate Anesthesia Transfer of Care Note  Patient: Benjamin Grant  Procedure(s) Performed: TRANSURETHRAL RESECTION OF BLADDER TUMOR WITH Gemcitabine (N/A Bladder)  Patient Location: PACU  Anesthesia Type:General  Level of Consciousness: awake  Airway & Oxygen Therapy: Patient Spontanous Breathing  Post-op Assessment: Report given to RN  Post vital signs: Reviewed  Last Vitals:  Vitals Value Taken Time  BP    Temp    Pulse 62 05/09/2018 11:09 AM  Resp 12 05/09/2018 11:09 AM  SpO2 100 % 05/09/2018 11:09 AM  Vitals shown include unvalidated device data.  Last Pain:  Vitals:   05/09/18 0905  TempSrc: Temporal  PainSc: 0-No pain         Complications: No apparent anesthesia complications

## 2018-05-09 NOTE — H&P (Signed)
@ENCDATE @ 9:49 AM   Benjamin TEBBETTS February 27, 1930 [id number]  Referring provider: No referring provider defined for this encounter.   HPI: 84 year old male with a history of gross hematuria.  CT urogram showed no upper tract abnormalities but a possible filling defect in the bladder.  Cystoscopy remarkable for a 1 cm papillary tumor on the left bladder wall lateral to the ureteral orifice.  He presents for TURBT.   PMH: Past Medical History:  Diagnosis Date  . AAA (abdominal aortic aneurysm) ([place])   . 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 Atrial fibrillation ([place])   . Dementia   . Depression   . Diabetes mellitus without complication ([place])   . Diverticulosis large intestine w/o perforation or abscess w/bleeding   . GERD without esophagitis   . Gross hematuria   . Hyperlipidemia   . Hypertension   . Ventricular tachycardia [facility])     Surgical History: History reviewed. No pertinent surgical history.  Home Medications:  acetaminophen 500 MG tablet Commonly known as:  TYLENOL Take 500 mg by mouth every 4 (four) hours as needed.   aspirin EC 81 MG tablet Take 81 mg by mouth daily.   citalopram 10 MG tablet Commonly known as:  CELEXA Take 15 mg by mouth daily.   dextromethorphan-guaiFENesin 10-100 MG/5ML liquid Commonly known as:  ROBITUSSIN-DM Take by mouth every 4 (four) hours as needed for cough.   diclofenac sodium 1 % Gel Commonly known as:  VOLTAREN Apply topically as needed.   digoxin 0.125 MG tablet Commonly known as:  LANOXIN Take by mouth daily.   diltiazem 180 MG 24 hr capsule Commonly known as:  TIAZAC Take 180 mg by mouth daily.   donepezil 10 MG tablet Commonly known as:  ARICEPT Take 10 mg by mouth at bedtime.   famotidine 20 MG tablet Commonly known as:  PEPCID Take 20 mg by mouth 2 (two) times daily.   finasteride 5 MG tablet Commonly known as:  PROSCAR Take 5 mg by mouth daily.   furosemide 40 MG tablet Commonly  known as:  LASIX Take 40 mg by mouth as needed.   glipiZIDE 10 MG tablet Commonly known as:  GLUCOTROL Take 10 mg by mouth 2 (two) times daily.   hydrocortisone 2.5 % rectal cream Commonly known as:  ANUSOL-HC Place 1 application rectally 2 (two) times daily as needed for hemorrhoids or anal itching.   insulin glargine 100 UNIT/ML injection Commonly known as:  LANTUS Inject 30 Units into the skin daily.   ketoconazole 2 % shampoo Commonly known as:  NIZORAL Apply 1 application topically as directed.   lisinopril 10 MG tablet Commonly known as:  PRINIVIL,ZESTRIL Take 10 mg by mouth daily.   memantine 10 MG tablet Commonly known as:  NAMENDA Take 10 mg by mouth 2 (two) times daily.   metoprolol tartrate 25 MG tablet Commonly known as:  LOPRESSOR Take 12.5 mg by mouth 2 (two) times daily.   simvastatin 10 MG tablet Commonly known as:  ZOCOR Take 10 mg by mouth daily.   sitaGLIPtin 100 MG tablet Commonly known as:  JANUVIA Take 100 mg by mouth daily.   sodium chloride 0.65 % Soln nasal spray Commonly known as:  OCEAN Place 1 spray into both nostrils as needed for congestion.   tamsulosin 0.4 MG Caps capsule Commonly known as:  FLOMAX Take 0.4 mg by mouth daily.   THEREMS PO Take by mouth daily.      Allergies:  Allergies  Allergen Reactions  . Penicillins     Per MAR    Family History: History reviewed. No pertinent family history.  Social History:  reports that he has quit smoking. He has never used smokeless tobacco. He reports that he drank alcohol. He reports that he does not use drugs.  ROS: Otherwise not typical except as per the HPI  Physical Exam: BP (!) 161/86   Pulse (!) 51   Temp (!) 96.6 F (35.9 C) (Temporal)   Resp 18   SpO2 100%   Constitutional:  Alert and oriented, No acute distress. HEENT: [place] AT, moist mucus membranes.  Trachea midline, no masses. Cardiovascular: No clubbing, cyanosis, or edema. RRR Respiratory:  Normal respiratory effort, no increased work of breathing.  Clear GI: Abdomen is soft, nontender, nondistended, no abdominal masses GU: No CVA tenderness Lymph: No cervical or inguinal lymphadenopathy. Skin: No rashes, bruises or suspicious lesions. Neurologic: Grossly intact, no focal deficits, moving all 4 extremities. Psychiatric: Normal mood and affect.  Laboratory Data: Lab Results  Component Value Date   WBC 8.6 01/16/2014   HGB 12.3 (L) 01/16/2014   HCT 36.5 (L) 01/16/2014   MCV 89 01/16/2014   PLT 145 (L) 01/16/2014    Lab Results  Component Value Date   CREATININE 0.80 05/02/2018    Lab Results  Component Value Date   HGBA1C 6.8 (H) 03/01/2012    Pertinent Imaging:  Results for orders placed during the hospital encounter of 04/10/18  CT HEMATURIA WORKUP   Narrative CLINICAL DATA:  Hematuria.  EXAM: CT ABDOMEN AND PELVIS WITHOUT AND WITH CONTRAST  TECHNIQUE: Multidetector CT imaging of the abdomen and pelvis was performed following the standard protocol before and following the bolus administration of intravenous contrast.  CONTRAST:  174mL ISOVUE-300 IOPAMIDOL (ISOVUE-300) INJECTION 61%  COMPARISON:  07/14/2012.  FINDINGS: Lower chest: No acute abnormality.  Hepatobiliary: Small low-attenuation foci within the liver are again noted and appears similar to previous exam, likely benign cysts. Small stones are noted layering within the dependent portion of the gallbladder. No biliary dilatation.  Pancreas: Unremarkable. No pancreatic ductal dilatation or surrounding inflammatory changes.  Spleen: Normal in size without focal abnormality.  Adrenals/Urinary Tract: The adrenal glands are normal. No urinary tract calculi identified. No kidney mass or hydronephrosis. Simple appearing cyst arises from the interpolar left kidney measuring 1.2 cm, image 31/13. Diffuse bladder wall trabeculation is identified. There is a possible small filling defect  identified along the left posterior wall of the urinary bladder which measures 7 mm, image 69/5 and image 61/10.  Stomach/Bowel: Stomach is within normal limits. Appendix appears normal. No evidence of bowel wall thickening, distention, or inflammatory changes. Numerous colonic diverticula identified. No inflammation.  Vascular/Lymphatic: Aortic atherosclerosis. Status post stent graft repair of infrarenal abdominal aortic aneurysm. Type 2 endoleak is identified which appears supplied by inferior mesenteric artery. Maximum AP diameter of the aneurysm sac measures 4.1 cm, image 39/5. On the previous exam the AP dimension of the aneurysm sac taken from the same level was 3.1 cm. Aneurysm arising from the medial wall of the left common iliac artery measures 3.9 cm, image 57/5. Stable from previous exam. Status post coiling of the left internal iliac artery.  No adenopathy within the abdomen or pelvis.  Reproductive: Prostate gland measures 5.8 by 4.2 by 5.7 cm (volume = 73 cm^3).  Other: No free fluid or fluid collections identified. There is a moderate size fat containing left inguinal hernia.  Musculoskeletal: No acute or  significant osseous findings.  IMPRESSION: 1. Diffuse bladder wall trabeculation suggestive of chronic bladder outlet obstruction. There is a possible small, 6 mm, filling defect arising from the left posterior wall of the urinary bladder. Urothelial lesion cannot be excluded. Consider correlation with direct visualization. 2. Prostate gland enlargement. 3. Abdominal aortic aneurysm status post stent graft repair. A type 2 endoleak is supplied by the inferior mesenteric artery. Since 07/14/2012 there has been interval increase in AP diameter of the aortic aneurysm sac which now measures 4.1 cm, previously 3.1 cm. Consider vascular surgery consultation. 4.  Aortic Atherosclerosis (ICD10-I70.0). 5. Gallstones. 6. Fat containing left inguinal  hernia.   Electronically Signed   By: Kerby Moors M.D.   On: 04/10/2018 16:15      Assessment & Plan:   84 year old male with a papillary bladder tumor endoscopically consistent with low-grade urothelial carcinoma.  He presents for TURBT.  The procedure has been discussed in detail including potential risks of bleeding, infection and bladder injury.   Abbie Sons, [facility] [address], [place], [place] [address] [phone]

## 2018-05-09 NOTE — Anesthesia Procedure Notes (Signed)
Procedure Name: LMA Insertion Date/Time: 05/09/2018 11:11 AM Performed by: Timoteo Expose, CRNA Pre-anesthesia Checklist: Patient identified, Emergency Drugs available, Suction available, Patient being monitored and Timeout performed Patient Re-evaluated:Patient Re-evaluated prior to induction Oxygen Delivery Method: Circle system utilized Preoxygenation: Pre-oxygenation with 100% oxygen Induction Type: IV induction Ventilation: Mask ventilation without difficulty LMA: LMA inserted LMA Size: 5.0

## 2018-05-09 NOTE — Op Note (Signed)
Preoperative diagnosis: 1. Bladder tumor (< 2cm)  Postoperative diagnosis:  1. Bladder tumor (< 2cm)  Procedure:  1. Cystoscopy 2. Transurethral resection of bladder tumor (small)  3. Instillation intravesical gemcitabine   Surgeon: Nicki Reaper C. Stoioff, M.D.  Anesthesia: General  Complications: None  Intraoperative findings:  1. ~ 1 cm papillary bladder tumor lateral to the left ureteral orifice; mild lateral lobe regrowth with hyperplastic appearing tissue right side   EBL: Minimal  Specimens: 1. Bladder tumor 2. Prostatic urethral biopsy   Indication: Benjamin Grant is a patient who was found to have a bladder tumor. After reviewing the management options for treatment, he elected to proceed with the above surgical procedure(s). We have discussed the potential benefits and risks of the procedure, side effects of the proposed treatment, the likelihood of the patient achieving the goals of the procedure, and any potential problems that might occur during the procedure or recuperation. Informed consent has been obtained.  Description of procedure:  The patient was taken to the operating room and general anesthesia was induced.  The patient was placed in the dorsal lithotomy position, prepped and draped in the usual sterile fashion, and preoperative antibiotics were administered. A preoperative time-out was performed.   Cystourethroscopy was performed.  The patient's urethra was examined and was normal.  The prostatic urethra was remarkable for TUR defect with mild lateral lobe regrowth distally.  On the right lateral lobe was hyper plastic appearing tissue with questionable papillary change.   The bladder was then systematically examined in its entirety.  There was severe trabeculation present with scattered cellules and diverticula.  The cystoscope was removed and a 27 French continuous-flow resectoscope sheath with visual obturator was passed under direct vision and was then  replaced with the [facility] resectoscope. Down to superficial muscle using loop cautery resection, the entire tumor was resected and removed for permanent pathologic analysis.   The area of hyperplastic tissue on the right lateral lobe was resected and sent separately.  A 16 French Foley catheter was placed without difficulty and irrigated with return of pink-tinged effluent.  Hemostasis was then achieved with the loop cautery and the bladder was emptied and reinspected with no further bleeding noted at the end of the procedure.    The bladder was then emptied and the procedure ended.  The catheter was placed close drainage in the tubing was clamped.  2000 mg of gemcitabine was instilled through the close system and will remain indwelling for 1 hour.  The patient appeared to tolerate the procedure well and without complications.  The patient was able to be awakened and transferred to the recovery unit in satisfactory condition.    Benjamin C. Bernardo Heater,  MD

## 2018-05-09 NOTE — OR Nursing (Signed)
Caregiver Deitra Mayo accompanying pt via [facility] transportation

## 2018-05-09 NOTE — Anesthesia Preprocedure Evaluation (Signed)
Anesthesia Evaluation  Patient identified by MRN, date of birth, ID band Patient awake    Reviewed: Allergy & Precautions, NPO status , Patient's Chart, lab work & pertinent test results, reviewed documented beta blocker date and time   Airway Mallampati: III  TM Distance: >3 FB     Dental  (+) Chipped   Pulmonary former smoker,           Cardiovascular hypertension, Pt. on medications and Pt. on home beta blockers + Peripheral Vascular Disease  + dysrhythmias      Neuro/Psych PSYCHIATRIC DISORDERS Depression Dementia    GI/Hepatic GERD  Controlled,  Endo/Other  diabetes, Type 2  Renal/GU      Musculoskeletal   Abdominal   Peds  Hematology   Anesthesia Other Findings Obese. EKG shows PACs and 1 block. Hx of Vtach.  Reproductive/Obstetrics                             Anesthesia Physical Anesthesia Plan  ASA: III  Anesthesia Plan: General   Post-op Pain Management:    Induction: Intravenous  PONV Risk Score and Plan:   Airway Management Planned: LMA  Additional Equipment:   Intra-op Plan:   Post-operative Plan:   Informed Consent: I have reviewed the patients History and Physical, chart, labs and discussed the procedure including the risks, benefits and alternatives for the proposed anesthesia with the patient or authorized representative who has indicated his/her understanding and acceptance.     Plan Discussed with: CRNA  Anesthesia Plan Comments:         Anesthesia Quick Evaluation

## 2018-05-09 NOTE — Anesthesia Postprocedure Evaluation (Signed)
Anesthesia Post Note  Patient: Benjamin Grant  Procedure(s) Performed: TRANSURETHRAL RESECTION OF BLADDER TUMOR WITH Gemcitabine (N/A Bladder)  Patient location during evaluation: PACU Anesthesia Type: General Level of consciousness: awake and alert Pain management: pain level controlled Vital Signs Assessment: post-procedure vital signs reviewed and stable Respiratory status: spontaneous breathing, nonlabored ventilation, respiratory function stable and patient connected to nasal cannula oxygen Cardiovascular status: blood pressure returned to baseline and stable Postop Assessment: no apparent nausea or vomiting Anesthetic complications: no     Last Vitals:  Vitals:   05/09/18 1220 05/09/18 1252  BP: (!) 165/89 (!) 153/89  Pulse: 63 67  Resp: 14 16  Temp: (!) 35.9 C (!) 36.2 C  SpO2: 97% 99%    Last Pain:  Vitals:   05/09/18 1252  TempSrc: Temporal  PainSc: 0-No pain                 [name] S

## 2018-05-09 NOTE — Interval H&P Note (Signed)
History and Physical Interval Note:  05/09/2018 10:04 AM  Benjamin Grant  has presented today for surgery, with the diagnosis of bladder lesion  The various methods of treatment have been discussed with the patient and family. After consideration of risks, benefits and other options for treatment, the patient has consented to  Procedure(s): TRANSURETHRAL RESECTION OF BLADDER TUMOR WITH Gemcitabine (N/A) as a surgical intervention .  The patient's history has been reviewed, patient examined, no change in status, stable for surgery.  I have reviewed the patient's chart and labs.  Questions were answered to the patient's satisfaction.     [place]

## 2018-05-09 NOTE — Discharge Instructions (Signed)

## 2018-05-09 NOTE — Anesthesia Post-op Follow-up Note (Signed)
Anesthesia QCDR form completed.        

## 2018-05-09 NOTE — OR Nursing (Signed)
Caregiver from [facility] with patient and received istructions, for discharge via [facility] transportation.

## 2018-05-12 LAB — SURGICAL PATHOLOGY

## 2018-05-20 ENCOUNTER — Encounter: Payer: Self-pay | Admitting: Urology

## 2018-05-20 ENCOUNTER — Ambulatory Visit (INDEPENDENT_AMBULATORY_CARE_PROVIDER_SITE_OTHER): Payer: Medicare Other | Admitting: Urology

## 2018-05-20 VITALS — BP 168/83 | HR 70 | Ht 70.5 in | Wt 234.1 lb

## 2018-05-20 DIAGNOSIS — C679 Malignant neoplasm of bladder, unspecified: Secondary | ICD-10-CM | POA: Diagnosis not present

## 2018-05-20 NOTE — Progress Notes (Signed)
05/20/2018 2:36 PM   Benjamin Grant 11-19-1930 [id number]  Referring provider: Venia Carbon, MD [address] [place], [place] [address]  Chief Complaint  Patient presents with  . Results    HPI: 84 year old male status post TURBT on 05/09/2018 for an approximately 1 cm papillary tumor.  He had no post biopsy problems.  He did receive post resection intravesical gemcitabine.  Pathology: Low-grade papillary urothelial carcinoma, noninvasive   PMH: Past Medical History:  Diagnosis Date  . AAA (abdominal aortic aneurysm) ([place])   . 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 Atrial fibrillation ([place])   . Dementia   . Depression   . Diabetes mellitus without complication ([place])   . Diverticulosis large intestine w/o perforation or abscess w/bleeding   . GERD without esophagitis   . Gross hematuria   . Hyperlipidemia   . Hypertension   . Ventricular tachycardia [facility])     Surgical History: Past Surgical History:  Procedure Laterality Date  . TRANSURETHRAL RESECTION OF BLADDER TUMOR WITH MITOMYCIN-C N/A 05/09/2018   Procedure: TRANSURETHRAL RESECTION OF BLADDER TUMOR WITH Gemcitabine;  Surgeon: Abbie Sons, MD;  Location: [facility] ORS;  Service: Urology;  Laterality: N/A;    Home Medications:  Allergies as of 05/20/2018      Reactions   Penicillins    Per [facility]      Medication List        Accurate as of 05/20/18  2:36 PM. Always use your most recent med list.          acetaminophen 500 MG tablet Commonly known as:  TYLENOL Take 1,000 mg by mouth every 4 (four) hours as needed for moderate pain or fever.   aspirin EC 81 MG tablet Take 81 mg by mouth every morning.   clindamycin 150 MG capsule Commonly known as:  CLEOCIN Take 600 mg by mouth See admin instructions. Take 600 mg by mouth 1 hour prior to dental appointment on 07/03/2018   digoxin 0.125 MG tablet Commonly known as:  LANOXIN Take 0.125 mg by mouth every morning.   diltiazem 180 MG  24 hr capsule Commonly known as:  TIAZAC Take 180 mg by mouth every morning.   donepezil 10 MG tablet Commonly known as:  ARICEPT Take 10 mg by mouth at bedtime.   famotidine 20 MG tablet Commonly known as:  PEPCID Take 20 mg by mouth 2 (two) times daily.   finasteride 5 MG tablet Commonly known as:  PROSCAR Take 5 mg by mouth every morning.   glipiZIDE 10 MG tablet Commonly known as:  GLUCOTROL Take 10 mg by mouth 2 (two) times daily.   guaifenesin 100 MG/5ML syrup Commonly known as:  ROBITUSSIN Take 200 mg by mouth every 4 (four) hours as needed for cough.   hydrocortisone 2.5 % rectal cream Commonly known as:  ANUSOL-HC Place 1 application rectally 2 (two) times daily as needed for hemorrhoids.   insulin glargine 100 UNIT/ML injection Commonly known as:  LANTUS Inject 34 Units into the skin at bedtime.   ketoconazole 2 % shampoo Commonly known as:  NIZORAL Apply 1 application topically See admin instructions. Apply to face, neck, and scalp twice weekly. Let sit for several minutes and rinse   lisinopril 10 MG tablet Commonly known as:  PRINIVIL,ZESTRIL Take 10 mg by mouth every morning.   memantine 10 MG tablet Commonly known as:  NAMENDA Take 10 mg by mouth 2 (two) times daily.   metoprolol tartrate  25 MG tablet Commonly known as:  LOPRESSOR Take 12.5 mg by mouth every 12 (twelve) hours.   simvastatin 10 MG tablet Commonly known as:  ZOCOR Take 10 mg by mouth every evening.   sitaGLIPtin 100 MG tablet Commonly known as:  JANUVIA Take 100 mg by mouth every morning.   sodium chloride 0.65 % Soln nasal spray Commonly known as:  OCEAN Place 1 spray into both nostrils See admin instructions. Place 1 spray in each nostril 4 times daily. May use 1 spray in each nostril every 2 hours as needed for dryness. May leave in room per [facility].   tamsulosin 0.4 MG Caps capsule Commonly known as:  FLOMAX Take 0.4 mg by mouth every morning.   THEREMS PO Take 1 tablet by  mouth every morning.   triamcinolone cream 0.1 % Commonly known as:  KENALOG Apply 1 application topically 3 (three) times daily as needed (rash/itching).       Allergies:  Allergies  Allergen Reactions  . Penicillins     Per MAR    Family History: History reviewed. No pertinent family history.  Social History:  reports that he has quit smoking. He has never used smokeless tobacco. He reports that he drank alcohol. He reports that he does not use drugs.  ROS: UROLOGY Frequent Urination?: No Hard to postpone urination?: No Burning/pain with urination?: No Get up at night to urinate?: No Leakage of urine?: No Urine stream starts and stops?: No Trouble starting stream?: No Do you have to strain to urinate?: No Blood in urine?: Yes Urinary tract infection?: No Sexually transmitted disease?: No Injury to kidneys or bladder?: No Painful intercourse?: No Weak stream?: No Erection problems?: No Penile pain?: No  Gastrointestinal Nausea?: No Vomiting?: No Indigestion/heartburn?: No Diarrhea?: No Constipation?: No  Constitutional Fever: No Night sweats?: No Weight loss?: No Fatigue?: No  Skin Skin rash/lesions?: No Itching?: No  Eyes Blurred vision?: No Double vision?: No  Ears/Nose/Throat Sore throat?: No Sinus problems?: No  Hematologic/Lymphatic Swollen glands?: No Easy bruising?: No  Cardiovascular Leg swelling?: No Chest pain?: No  Respiratory Cough?: No Shortness of breath?: No  Endocrine Excessive thirst?: No  Musculoskeletal Back pain?: No Joint pain?: No  Neurological Headaches?: No Dizziness?: No  Psychologic Depression?: No Anxiety?: No  Physical Exam: BP (!) 168/83 (BP Location: Right Arm, Patient Position: Sitting, Cuff Size: Large)   Pulse 70   Ht 5' 10.5" (1.791 m)   Wt 234 lb 1.6 oz (106.2 kg)   SpO2 99%   BMI 33.12 kg/m   Constitutional:  Alert. No acute distress.   Assessment & Plan:   84 year old male  with Ta urothelial carcinoma the bladder, low risk.  The pathology report was discussed in detail.  Recommend surveillance cystoscopy in 4 months.   Return in about 4 months (around 09/20/2018) for Cystoscopy.  Abbie Sons, [place] [address] [place], [place] [address] [phone]

## 2018-05-29 DIAGNOSIS — E119 Type 2 diabetes mellitus without complications: Secondary | ICD-10-CM | POA: Diagnosis not present

## 2018-05-29 DIAGNOSIS — F39 Unspecified mood [affective] disorder: Secondary | ICD-10-CM | POA: Diagnosis not present

## 2018-05-29 DIAGNOSIS — C679 Malignant neoplasm of bladder, unspecified: Secondary | ICD-10-CM | POA: Diagnosis not present

## 2018-05-29 DIAGNOSIS — F0150 Vascular dementia without behavioral disturbance: Secondary | ICD-10-CM | POA: Diagnosis not present

## 2018-05-29 DIAGNOSIS — I480 Paroxysmal atrial fibrillation: Secondary | ICD-10-CM | POA: Diagnosis not present

## 2018-08-06 DIAGNOSIS — E1165 Type 2 diabetes mellitus with hyperglycemia: Secondary | ICD-10-CM | POA: Diagnosis not present

## 2018-08-08 DIAGNOSIS — N401 Enlarged prostate with lower urinary tract symptoms: Secondary | ICD-10-CM | POA: Diagnosis not present

## 2018-08-08 DIAGNOSIS — C679 Malignant neoplasm of bladder, unspecified: Secondary | ICD-10-CM | POA: Diagnosis not present

## 2018-08-08 DIAGNOSIS — F0150 Vascular dementia without behavioral disturbance: Secondary | ICD-10-CM

## 2018-08-08 DIAGNOSIS — F39 Unspecified mood [affective] disorder: Secondary | ICD-10-CM | POA: Diagnosis not present

## 2018-08-08 DIAGNOSIS — I4891 Unspecified atrial fibrillation: Secondary | ICD-10-CM | POA: Diagnosis not present

## 2018-08-08 DIAGNOSIS — I10 Essential (primary) hypertension: Secondary | ICD-10-CM | POA: Diagnosis not present

## 2018-08-08 DIAGNOSIS — K219 Gastro-esophageal reflux disease without esophagitis: Secondary | ICD-10-CM | POA: Diagnosis not present

## 2018-08-08 DIAGNOSIS — E1140 Type 2 diabetes mellitus with diabetic neuropathy, unspecified: Secondary | ICD-10-CM | POA: Diagnosis not present

## 2018-08-11 ENCOUNTER — Ambulatory Visit (INDEPENDENT_AMBULATORY_CARE_PROVIDER_SITE_OTHER): Payer: Medicare Other

## 2018-08-11 ENCOUNTER — Other Ambulatory Visit (INDEPENDENT_AMBULATORY_CARE_PROVIDER_SITE_OTHER): Payer: Self-pay | Admitting: Vascular Surgery

## 2018-08-11 ENCOUNTER — Ambulatory Visit (INDEPENDENT_AMBULATORY_CARE_PROVIDER_SITE_OTHER): Payer: Medicare Other | Admitting: Vascular Surgery

## 2018-08-11 ENCOUNTER — Encounter (INDEPENDENT_AMBULATORY_CARE_PROVIDER_SITE_OTHER): Payer: Self-pay | Admitting: Vascular Surgery

## 2018-08-11 VITALS — BP 150/75 | HR 48 | Resp 16 | Ht 71.0 in | Wt 215.0 lb

## 2018-08-11 DIAGNOSIS — E782 Mixed hyperlipidemia: Secondary | ICD-10-CM

## 2018-08-11 DIAGNOSIS — I714 Abdominal aortic aneurysm, without rupture, unspecified: Secondary | ICD-10-CM

## 2018-08-11 DIAGNOSIS — I10 Essential (primary) hypertension: Secondary | ICD-10-CM

## 2018-08-11 DIAGNOSIS — I724 Aneurysm of artery of lower extremity: Secondary | ICD-10-CM

## 2018-08-11 DIAGNOSIS — Z794 Long term (current) use of insulin: Secondary | ICD-10-CM | POA: Diagnosis not present

## 2018-08-11 DIAGNOSIS — E1142 Type 2 diabetes mellitus with diabetic polyneuropathy: Secondary | ICD-10-CM

## 2018-08-16 ENCOUNTER — Encounter (INDEPENDENT_AMBULATORY_CARE_PROVIDER_SITE_OTHER): Payer: Self-pay | Admitting: Vascular Surgery

## 2018-08-16 NOTE — Progress Notes (Signed)
MRN : [id number]  Benjamin Grant is a 84 y.o. (02/27/1930) male who presents with chief complaint of  Chief Complaint  Patient presents with  . Follow-up    One year EVAR, and Arterial  .  History of Present Illness:   The patient returns to the office for surveillance of an abdominal aortic aneurysm status post stent graft placement.   Patient denies abdominal pain or back pain, no other abdominal complaints. No groin related complaints. No symptoms consistent with distal embolization No changes in claudication distance.   There have been no interval changes in his overall healthcare since his last visit.   Patient denies amaurosis fugax or TIA symptoms. There is no history of claudication or rest pain symptoms of the lower extremities. The patient denies angina or shortness of breath.   Duplex US of the aorta and iliac arteries shows a 3.3 cm AAA sac with no endoleak, decreased in the sac compared to the previous study.  Current Meds  Medication Sig  . acetaminophen (TYLENOL) 500 MG tablet Take 1,000 mg by mouth every 4 (four) hours as needed for moderate pain or fever.   Marland Kitchen aspirin EC 81 MG tablet Take 81 mg by mouth every morning.   . citalopram (CELEXA) 10 MG tablet   . clindamycin (CLEOCIN) 150 MG capsule Take 600 mg by mouth See admin instructions. Take 600 mg by mouth 1 hour prior to dental appointment on 07/03/2018  . digoxin (LANOXIN) 0.125 MG tablet Take 0.125 mg by mouth every morning.   . diltiazem (CARDIZEM CD) 180 MG 24 hr capsule   . diltiazem (TIAZAC) 180 MG 24 hr capsule Take 180 mg by mouth every morning.   . donepezil (ARICEPT) 10 MG tablet Take 10 mg by mouth at bedtime.  . famotidine (PEPCID) 20 MG tablet Take 20 mg by mouth 2 (two) times daily.  . finasteride (PROSCAR) 5 MG tablet Take 5 mg by mouth every morning.   Marland Kitchen glipiZIDE (GLUCOTROL) 10 MG tablet Take 10 mg by mouth 2 (two) times daily.  Marland Kitchen guaifenesin (ROBITUSSIN) 100 MG/5ML syrup Take 200 mg  by mouth every 4 (four) hours as needed for cough.  . hydrocortisone (ANUSOL-HC) 2.5 % rectal cream Place 1 application rectally 2 (two) times daily as needed for hemorrhoids.   . insulin glargine (LANTUS) 100 UNIT/ML injection Inject 34 Units into the skin at bedtime.   Marland Kitchen ketoconazole (NIZORAL) 2 % shampoo Apply 1 application topically See admin instructions. Apply to face, neck, and scalp twice weekly. Let sit for several minutes and rinse  . lisinopril (PRINIVIL,ZESTRIL) 10 MG tablet Take 10 mg by mouth every morning.   . memantine (NAMENDA) 10 MG tablet Take 10 mg by mouth 2 (two) times daily.  . metoprolol tartrate (LOPRESSOR) 25 MG tablet Take 12.5 mg by mouth every 12 (twelve) hours.   . Multiple Vitamin (THEREMS PO) Take 1 tablet by mouth every morning.   . simvastatin (ZOCOR) 10 MG tablet Take 10 mg by mouth every evening.   . sitaGLIPtin (JANUVIA) 100 MG tablet Take 100 mg by mouth every morning.   . sodium chloride (OCEAN) 0.65 % SOLN nasal spray Place 1 spray into both nostrils See admin instructions. Place 1 spray in each nostril 4 times daily. May use 1 spray in each nostril every 2 hours as needed for dryness. May leave in room per [facility].  . tamsulosin (FLOMAX) 0.4 MG CAPS capsule Take 0.4 mg by mouth every morning.   Marland Kitchen  triamcinolone cream (KENALOG) 0.1 % Apply 1 application topically 3 (three) times daily as needed (rash/itching).    Past Medical History:  Diagnosis Date  . AAA (abdominal aortic aneurysm) ([place])   . 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 Atrial fibrillation ([place])   . Dementia   . Depression   . Diabetes mellitus without complication ([place])   . Diverticulosis large intestine w/o perforation or abscess w/bleeding   . GERD without esophagitis   . Gross hematuria   . Hyperlipidemia   . Hypertension   . Ventricular tachycardia [facility])     Past Surgical History:  Procedure Laterality Date  . TRANSURETHRAL RESECTION OF BLADDER TUMOR WITH MITOMYCIN-C N/A  05/09/2018   Procedure: TRANSURETHRAL RESECTION OF BLADDER TUMOR WITH Gemcitabine;  Surgeon: Abbie Sons, MD;  Location: [facility] ORS;  Service: Urology;  Laterality: N/A;    Social History Social History   Tobacco Use  . Smoking status: Former Research scientist (life sciences)  . Smokeless tobacco: Never Used  Substance Use Topics  . Alcohol use: Not Currently  . Drug use: Never    Family History History reviewed. No pertinent family history.  Allergies  Allergen Reactions  . Penicillins     Per MAR     REVIEW OF SYSTEMS (Negative unless checked)  Constitutional: [] Weight loss  [] Fever  [] Chills Cardiac: [] Chest pain   [] Chest pressure   [] Palpitations   [] Shortness of breath when laying flat   [] Shortness of breath with exertion. Vascular:  [] Pain in legs with walking   [] Pain in legs at rest  [] History of DVT   [] Phlebitis   [] Swelling in legs   [] Varicose veins   [] Non-healing ulcers Pulmonary:   [] Uses home oxygen   [] Productive cough   [] Hemoptysis   [] Wheeze  [] COPD   [] Asthma Neurologic:  [] Dizziness   [] Seizures   [] History of stroke   [] History of TIA  [] Aphasia   [] Vissual changes   [] Weakness or numbness in arm   [] Weakness or numbness in leg Musculoskeletal:   [] Joint swelling   [] Joint pain   [] Low back pain Hematologic:  [] Easy bruising  [] Easy bleeding   [] Hypercoagulable state   [] Anemic Gastrointestinal:  [] Diarrhea   [] Vomiting  [] Gastroesophageal reflux/heartburn   [] Difficulty swallowing. Genitourinary:  [] Chronic kidney disease   [] Difficult urination  [] Frequent urination   [] Blood in urine Skin:  [] Rashes   [] Ulcers  Psychological:  [] History of anxiety   []  History of major depression.  Physical Examination  Vitals:   08/11/18 0914  BP: (!) 150/75  Pulse: (!) 48  Resp: 16  Weight: 215 lb (97.5 kg)  Height: 5\' 11"  (1.803 m)   Body mass index is 29.99 kg/m. Gen: WD/WN, NAD Head: [place]/AT, No temporalis wasting.  Ear/Nose/Throat: Hearing grossly intact, nares w/o erythema  or drainage Eyes: PER, EOMI, sclera nonicteric.  Neck: Supple, no large masses.   Pulmonary:  Good air movement, no audible wheezing bilaterally, no use of accessory muscles.  Cardiac: RRR, no JVD Vascular:  Vessel Right Left  Radial Palpable Palpable  Ulnar Palpable Palpable  Brachial Palpable Palpable  Carotid Palpable Palpable  Femoral Palpable Palpable  Popliteal Palpable Palpable  PT Palpable Palpable  DP Palpable Palpable  Gastrointestinal: Non-distended. No guarding/no peritoneal signs.  Musculoskeletal: M/S 5/5 throughout.  No deformity or atrophy.  Neurologic: CN 2-12 intact. Symmetrical.  Speech is fluent. Motor exam as listed above. Psychiatric: Judgment intact, Mood & affect appropriate for pt's clinical situation. Dermatologic: No rashes or ulcers noted.  No changes consistent with cellulitis. Lymph : No lichenification or skin changes of chronic lymphedema.  CBC Lab Results  Component Value Date   WBC 8.6 01/16/2014   HGB 12.3 (L) 01/16/2014   HCT 36.5 (L) 01/16/2014   MCV 89 01/16/2014   PLT 145 (L) 01/16/2014    BMET    Component Value Date/Time   NA 136 05/02/2018 1257   NA 131 (L) 01/17/2014 0359   K 3.7 05/02/2018 1257   K 3.9 01/17/2014 0359   CL 101 05/02/2018 1257   CL 100 01/17/2014 0359   CO2 27 05/02/2018 1257   CO2 28 01/17/2014 0359   GLUCOSE 210 (H) 05/02/2018 1257   GLUCOSE 245 (H) 01/17/2014 0359   BUN 16 05/02/2018 1257   BUN 13 03/25/2018 1528   BUN 18 01/17/2014 0359   CREATININE 0.80 05/02/2018 1257   CREATININE 0.94 01/17/2014 0359   CALCIUM 9.3 05/02/2018 1257   CALCIUM 8.7 01/17/2014 0359   GFRNONAA >60 05/02/2018 1257   GFRNONAA >60 01/17/2014 0359   GFRAA >60 05/02/2018 1257   GFRAA >60 01/17/2014 0359   CrCl cannot be calculated (Patient's most recent lab result is older than the maximum 21 days allowed.).  COAG Lab Results  Component Value Date   INR 0.9 07/13/2012   INR 0.9 06/04/2012   INR 1.2 02/29/2012     Radiology No results found.   Assessment/Plan 1. AAA (abdominal aortic aneurysm) without rupture (HCC) Recommend: Patient is status post successful endovascular repair of the AAA.   No further intervention is required at this time.   No endoleak is detected and the aneurysm sac is stable.  The patient will continue antiplatelet therapy as prescribed as well as aggressive management of hyperlipidemia. Exercise is again strongly encouraged.   However, endografts require continued surveillance with ultrasound or CT scan. This is mandatory to detect any changes that allow repressurization of the aneurysm sac.  The patient is informed that this would be asymptomatic.  The patient is reminded that lifelong routine surveillance is a necessity with an endograft. Patient will continue to follow-up at 6 month intervals with ultrasound of the aorta. - VAS US AORTA/IVC/ILIACS; Future  2. Popliteal artery aneurysm (HCC) Continue to follow with ultraosund  No intervention  3. Essential hypertension Continue antihypertensive medications as already ordered, these medications have been reviewed and there are no changes at this time.   4. Type 2 diabetes mellitus with diabetic polyneuropathy, with long-term current use of insulin (HCC) Continue hypoglycemic medications as already ordered, these medications have been reviewed and there are no changes at this time.  Hgb A1C to be monitored as already arranged by primary service   5. Mixed hyperlipidemia Continue statin as ordered and reviewed, no changes at this time     Hortencia Pilar, MD  08/16/2018 1:49 PM

## 2018-09-24 ENCOUNTER — Other Ambulatory Visit: Payer: 59 | Admitting: Urology

## 2018-09-26 ENCOUNTER — Encounter: Payer: Self-pay | Admitting: Urology

## 2018-09-26 ENCOUNTER — Ambulatory Visit (INDEPENDENT_AMBULATORY_CARE_PROVIDER_SITE_OTHER): Payer: Medicare Other | Admitting: Urology

## 2018-09-26 VITALS — BP 142/66 | HR 57 | Ht 70.5 in | Wt 224.3 lb

## 2018-09-26 DIAGNOSIS — C679 Malignant neoplasm of bladder, unspecified: Secondary | ICD-10-CM

## 2018-09-26 LAB — URINALYSIS, COMPLETE
Bilirubin, UA: NEGATIVE
GLUCOSE, UA: NEGATIVE
Ketones, UA: NEGATIVE
NITRITE UA: POSITIVE — AB
PH UA: 5.5 (ref 5.0–7.5)
PROTEIN UA: NEGATIVE
Specific Gravity, UA: 1.015 (ref 1.005–1.030)
Urobilinogen, Ur: 0.2 mg/dL (ref 0.2–1.0)

## 2018-09-26 LAB — MICROSCOPIC EXAMINATION: RBC MICROSCOPIC, UA: NONE SEEN /HPF (ref 0–2)

## 2018-09-26 MED ORDER — LIDOCAINE HCL URETHRAL/MUCOSAL 2 % EX GEL
1.0000 | Freq: Once | CUTANEOUS | Status: AC
Start: 2018-09-26 — End: 2018-09-26
  Administered 2018-09-26: 1 via URETHRAL

## 2018-09-26 NOTE — Progress Notes (Signed)
In and Out Catheterization  Patient is present today for a I & O catheterization as patient is unable to void for UA. Patient was cleaned and prepped in a sterile fashion with betadine and Lidocaine 2% jelly was instilled into the urethra.  A 14FR cath was inserted no complications were noted , 139ml of urine return was noted, urine was yellow in color. A clean urine sample was collected for UA. Bladder was drained  And catheter was removed with out difficulty.    Preformed by: Gordy Clement, [place] ([place])

## 2018-09-26 NOTE — Progress Notes (Signed)
He was scheduled today for surveillance cystoscopy.  Urinalysis was nitrite positive with pyuria.  The cystoscopy was postponed and urine cultures ordered.  Will repeat his UA 1 week prior to his cystoscopy date.

## 2018-09-26 NOTE — Addendum Note (Signed)
Addended by: Donalee Citrin on: 09/26/2018 02:59 PM   Modules accepted: Orders

## 2018-09-30 LAB — CULTURE, URINE COMPREHENSIVE

## 2018-10-02 DIAGNOSIS — E119 Type 2 diabetes mellitus without complications: Secondary | ICD-10-CM | POA: Diagnosis not present

## 2018-10-02 DIAGNOSIS — F0150 Vascular dementia without behavioral disturbance: Secondary | ICD-10-CM | POA: Diagnosis not present

## 2018-10-02 DIAGNOSIS — I480 Paroxysmal atrial fibrillation: Secondary | ICD-10-CM | POA: Diagnosis not present

## 2018-10-02 DIAGNOSIS — C679 Malignant neoplasm of bladder, unspecified: Secondary | ICD-10-CM | POA: Diagnosis not present

## 2018-10-02 DIAGNOSIS — F39 Unspecified mood [affective] disorder: Secondary | ICD-10-CM | POA: Diagnosis not present

## 2018-10-05 ENCOUNTER — Other Ambulatory Visit: Payer: Self-pay | Admitting: Urology

## 2018-10-05 MED ORDER — SULFAMETHOXAZOLE-TRIMETHOPRIM 800-160 MG PO TABS: 1.0000 | ORAL_TABLET | Freq: Two times a day (BID) | ORAL | 0 refills | Status: DC

## 2018-10-06 ENCOUNTER — Telehealth: Payer: Self-pay

## 2018-10-06 NOTE — Telephone Encounter (Signed)
Patient's caregiver notified.

## 2018-10-06 NOTE — Telephone Encounter (Signed)
-----   Message from Abbie Sons, MD sent at 10/05/2018  8:43 PM EDT ----- Urine cx was positive- antbiotic rx was sent to pharm

## 2018-10-07 ENCOUNTER — Other Ambulatory Visit: Payer: Medicare Other

## 2018-10-07 ENCOUNTER — Other Ambulatory Visit: Payer: Self-pay

## 2018-10-07 DIAGNOSIS — C679 Malignant neoplasm of bladder, unspecified: Secondary | ICD-10-CM | POA: Diagnosis not present

## 2018-10-07 DIAGNOSIS — R310 Gross hematuria: Secondary | ICD-10-CM

## 2018-10-07 LAB — URINALYSIS, COMPLETE
Bilirubin, UA: NEGATIVE
Ketones, UA: NEGATIVE
LEUKOCYTES UA: NEGATIVE
Nitrite, UA: NEGATIVE
PH UA: 6 (ref 5.0–7.5)
Specific Gravity, UA: 1.025 (ref 1.005–1.030)
UUROB: 0.2 mg/dL (ref 0.2–1.0)

## 2018-10-07 LAB — MICROSCOPIC EXAMINATION
BACTERIA UA: NONE SEEN
Epithelial Cells (non renal): NONE SEEN /hpf (ref 0–10)

## 2018-10-15 ENCOUNTER — Encounter: Payer: Self-pay | Admitting: Urology

## 2018-10-15 ENCOUNTER — Ambulatory Visit (INDEPENDENT_AMBULATORY_CARE_PROVIDER_SITE_OTHER): Payer: Medicare Other | Admitting: Urology

## 2018-10-15 VITALS — BP 147/75 | HR 64 | Ht 70.5 in | Wt 224.7 lb

## 2018-10-15 DIAGNOSIS — C679 Malignant neoplasm of bladder, unspecified: Secondary | ICD-10-CM

## 2018-10-15 DIAGNOSIS — R310 Gross hematuria: Secondary | ICD-10-CM

## 2018-10-15 MED ORDER — LIDOCAINE HCL URETHRAL/MUCOSAL 2 % EX GEL: 1.0000 "application " | Freq: Once | CUTANEOUS | Status: DC

## 2018-10-15 NOTE — Progress Notes (Signed)
   10/15/18  CC:  Chief Complaint  Patient presents with  . Cysto    HPI: TURBT 04/2018 for a 1 cm papillary tumor which returned low-grade noninvasive urothelial carcinoma.  He was scheduled for surveillance cystoscopy earlier this month however had pyuria and was rescheduled.  He has no complaints today.  Blood pressure (!) 147/75, pulse 64, height 5' 10.5" (1.791 m), weight 224 lb 11.2 oz (101.9 kg). NED. A&Ox3.     Cystoscopy Procedure Note  Patient identification was confirmed, informed consent was obtained, and patient was prepped using Betadine solution.  Lidocaine jelly was administered per urethral meatus.     Pre-Procedure: - Inspection reveals a normal caliber ureteral meatus.  Procedure: The flexible cystoscope was introduced without difficulty - No urethral strictures/lesions are present. - TUR changes prostate with an enlarged right lateral lobe extending across midline - Moderate elevation bladder neck - Bilateral ureteral orifices identified - Bladder mucosa  reveals no ulcers, tumors, or lesions - No bladder stones -Moderate trabeculation with cellule formation  Retroflexion shows no intravesical median lobe   Post-Procedure: - Patient tolerated the procedure well  Assessment/ Plan: 84 year old male with low risk Ta urothelial carcinoma.  His initial post TURBT cystoscopy shows no evidence of recurrent tumor.  Return in about 9 months (around 07/16/2019) for Cystoscopy.   Abbie Sons, MD

## 2018-10-16 LAB — URINALYSIS, COMPLETE
BILIRUBIN UA: NEGATIVE
Ketones, UA: NEGATIVE
LEUKOCYTES UA: NEGATIVE
Nitrite, UA: NEGATIVE
PROTEIN UA: NEGATIVE
RBC, UA: NEGATIVE
Specific Gravity, UA: 1.025 (ref 1.005–1.030)
UUROB: 0.2 mg/dL (ref 0.2–1.0)
pH, UA: 5.5 (ref 5.0–7.5)

## 2018-10-16 LAB — MICROSCOPIC EXAMINATION
BACTERIA UA: NONE SEEN
RBC, UA: NONE SEEN /hpf (ref 0–2)

## 2018-10-31 DIAGNOSIS — B351 Tinea unguium: Secondary | ICD-10-CM | POA: Diagnosis not present

## 2018-11-03 DIAGNOSIS — E119 Type 2 diabetes mellitus without complications: Secondary | ICD-10-CM | POA: Diagnosis not present

## 2018-11-03 DIAGNOSIS — Z79899 Other long term (current) drug therapy: Secondary | ICD-10-CM | POA: Diagnosis not present

## 2018-11-03 DIAGNOSIS — D649 Anemia, unspecified: Secondary | ICD-10-CM | POA: Diagnosis not present

## 2018-11-03 DIAGNOSIS — E785 Hyperlipidemia, unspecified: Secondary | ICD-10-CM | POA: Diagnosis not present

## 2018-11-03 DIAGNOSIS — I10 Essential (primary) hypertension: Secondary | ICD-10-CM | POA: Diagnosis not present

## 2018-11-26 DIAGNOSIS — C679 Malignant neoplasm of bladder, unspecified: Secondary | ICD-10-CM | POA: Diagnosis not present

## 2018-11-26 DIAGNOSIS — I10 Essential (primary) hypertension: Secondary | ICD-10-CM | POA: Diagnosis not present

## 2018-11-26 DIAGNOSIS — I4891 Unspecified atrial fibrillation: Secondary | ICD-10-CM | POA: Diagnosis not present

## 2018-11-26 DIAGNOSIS — N401 Enlarged prostate with lower urinary tract symptoms: Secondary | ICD-10-CM | POA: Diagnosis not present

## 2018-11-26 DIAGNOSIS — E1159 Type 2 diabetes mellitus with other circulatory complications: Secondary | ICD-10-CM | POA: Diagnosis not present

## 2018-11-26 DIAGNOSIS — K219 Gastro-esophageal reflux disease without esophagitis: Secondary | ICD-10-CM | POA: Diagnosis not present

## 2018-11-26 DIAGNOSIS — F0150 Vascular dementia without behavioral disturbance: Secondary | ICD-10-CM

## 2018-11-26 DIAGNOSIS — F39 Unspecified mood [affective] disorder: Secondary | ICD-10-CM | POA: Diagnosis not present

## 2019-01-09 DIAGNOSIS — B351 Tinea unguium: Secondary | ICD-10-CM | POA: Diagnosis not present

## 2019-02-02 DIAGNOSIS — F39 Unspecified mood [affective] disorder: Secondary | ICD-10-CM | POA: Diagnosis not present

## 2019-02-02 DIAGNOSIS — F0150 Vascular dementia without behavioral disturbance: Secondary | ICD-10-CM | POA: Diagnosis not present

## 2019-02-02 DIAGNOSIS — E119 Type 2 diabetes mellitus without complications: Secondary | ICD-10-CM | POA: Diagnosis not present

## 2019-02-02 DIAGNOSIS — I480 Paroxysmal atrial fibrillation: Secondary | ICD-10-CM | POA: Diagnosis not present

## 2019-02-02 DIAGNOSIS — N400 Enlarged prostate without lower urinary tract symptoms: Secondary | ICD-10-CM | POA: Diagnosis not present

## 2019-03-25 DIAGNOSIS — B351 Tinea unguium: Secondary | ICD-10-CM | POA: Diagnosis not present

## 2019-03-25 DIAGNOSIS — I4891 Unspecified atrial fibrillation: Secondary | ICD-10-CM | POA: Diagnosis not present

## 2019-03-25 DIAGNOSIS — E1159 Type 2 diabetes mellitus with other circulatory complications: Secondary | ICD-10-CM | POA: Diagnosis not present

## 2019-03-25 DIAGNOSIS — N401 Enlarged prostate with lower urinary tract symptoms: Secondary | ICD-10-CM | POA: Diagnosis not present

## 2019-03-25 DIAGNOSIS — F39 Unspecified mood [affective] disorder: Secondary | ICD-10-CM | POA: Diagnosis not present

## 2019-03-25 DIAGNOSIS — I10 Essential (primary) hypertension: Secondary | ICD-10-CM | POA: Diagnosis not present

## 2019-03-25 DIAGNOSIS — F0150 Vascular dementia without behavioral disturbance: Secondary | ICD-10-CM | POA: Diagnosis not present

## 2019-03-25 DIAGNOSIS — K219 Gastro-esophageal reflux disease without esophagitis: Secondary | ICD-10-CM | POA: Diagnosis not present

## 2019-04-27 DIAGNOSIS — E119 Type 2 diabetes mellitus without complications: Secondary | ICD-10-CM | POA: Diagnosis not present

## 2019-04-27 DIAGNOSIS — E785 Hyperlipidemia, unspecified: Secondary | ICD-10-CM | POA: Diagnosis not present

## 2019-04-27 DIAGNOSIS — D649 Anemia, unspecified: Secondary | ICD-10-CM | POA: Diagnosis not present

## 2019-04-27 DIAGNOSIS — Z79899 Other long term (current) drug therapy: Secondary | ICD-10-CM | POA: Diagnosis not present

## 2019-05-07 DIAGNOSIS — R531 Weakness: Secondary | ICD-10-CM | POA: Diagnosis not present

## 2019-05-08 DIAGNOSIS — R278 Other lack of coordination: Secondary | ICD-10-CM | POA: Diagnosis not present

## 2019-05-08 DIAGNOSIS — Z741 Need for assistance with personal care: Secondary | ICD-10-CM | POA: Diagnosis not present

## 2019-05-08 DIAGNOSIS — I69351 Hemiplegia and hemiparesis following cerebral infarction affecting right dominant side: Secondary | ICD-10-CM | POA: Diagnosis not present

## 2019-05-08 DIAGNOSIS — F0390 Unspecified dementia without behavioral disturbance: Secondary | ICD-10-CM | POA: Diagnosis not present

## 2019-05-08 DIAGNOSIS — M6281 Muscle weakness (generalized): Secondary | ICD-10-CM | POA: Diagnosis not present

## 2019-05-11 DIAGNOSIS — Z741 Need for assistance with personal care: Secondary | ICD-10-CM | POA: Diagnosis not present

## 2019-05-11 DIAGNOSIS — R278 Other lack of coordination: Secondary | ICD-10-CM | POA: Diagnosis not present

## 2019-05-11 DIAGNOSIS — M6281 Muscle weakness (generalized): Secondary | ICD-10-CM | POA: Diagnosis not present

## 2019-05-11 DIAGNOSIS — F0390 Unspecified dementia without behavioral disturbance: Secondary | ICD-10-CM | POA: Diagnosis not present

## 2019-05-12 DIAGNOSIS — F0390 Unspecified dementia without behavioral disturbance: Secondary | ICD-10-CM | POA: Diagnosis not present

## 2019-05-12 DIAGNOSIS — R278 Other lack of coordination: Secondary | ICD-10-CM | POA: Diagnosis not present

## 2019-05-12 DIAGNOSIS — Z741 Need for assistance with personal care: Secondary | ICD-10-CM | POA: Diagnosis not present

## 2019-05-12 DIAGNOSIS — M6281 Muscle weakness (generalized): Secondary | ICD-10-CM | POA: Diagnosis not present

## 2019-05-13 DIAGNOSIS — F0390 Unspecified dementia without behavioral disturbance: Secondary | ICD-10-CM | POA: Diagnosis not present

## 2019-05-13 DIAGNOSIS — M6281 Muscle weakness (generalized): Secondary | ICD-10-CM | POA: Diagnosis not present

## 2019-05-13 DIAGNOSIS — Z741 Need for assistance with personal care: Secondary | ICD-10-CM | POA: Diagnosis not present

## 2019-05-13 DIAGNOSIS — R278 Other lack of coordination: Secondary | ICD-10-CM | POA: Diagnosis not present

## 2019-05-14 DIAGNOSIS — F0390 Unspecified dementia without behavioral disturbance: Secondary | ICD-10-CM | POA: Diagnosis not present

## 2019-05-14 DIAGNOSIS — M6281 Muscle weakness (generalized): Secondary | ICD-10-CM | POA: Diagnosis not present

## 2019-05-14 DIAGNOSIS — Z741 Need for assistance with personal care: Secondary | ICD-10-CM | POA: Diagnosis not present

## 2019-05-14 DIAGNOSIS — R278 Other lack of coordination: Secondary | ICD-10-CM | POA: Diagnosis not present

## 2019-05-15 DIAGNOSIS — F0390 Unspecified dementia without behavioral disturbance: Secondary | ICD-10-CM | POA: Diagnosis not present

## 2019-05-15 DIAGNOSIS — R278 Other lack of coordination: Secondary | ICD-10-CM | POA: Diagnosis not present

## 2019-05-15 DIAGNOSIS — M6281 Muscle weakness (generalized): Secondary | ICD-10-CM | POA: Diagnosis not present

## 2019-05-15 DIAGNOSIS — Z741 Need for assistance with personal care: Secondary | ICD-10-CM | POA: Diagnosis not present

## 2019-05-18 DIAGNOSIS — Z741 Need for assistance with personal care: Secondary | ICD-10-CM | POA: Diagnosis not present

## 2019-05-18 DIAGNOSIS — M6281 Muscle weakness (generalized): Secondary | ICD-10-CM | POA: Diagnosis not present

## 2019-05-18 DIAGNOSIS — F0390 Unspecified dementia without behavioral disturbance: Secondary | ICD-10-CM | POA: Diagnosis not present

## 2019-05-18 DIAGNOSIS — R278 Other lack of coordination: Secondary | ICD-10-CM | POA: Diagnosis not present

## 2019-05-19 DIAGNOSIS — Z741 Need for assistance with personal care: Secondary | ICD-10-CM | POA: Diagnosis not present

## 2019-05-19 DIAGNOSIS — R278 Other lack of coordination: Secondary | ICD-10-CM | POA: Diagnosis not present

## 2019-05-19 DIAGNOSIS — M6281 Muscle weakness (generalized): Secondary | ICD-10-CM | POA: Diagnosis not present

## 2019-05-19 DIAGNOSIS — F0390 Unspecified dementia without behavioral disturbance: Secondary | ICD-10-CM | POA: Diagnosis not present

## 2019-05-20 DIAGNOSIS — Z741 Need for assistance with personal care: Secondary | ICD-10-CM | POA: Diagnosis not present

## 2019-05-20 DIAGNOSIS — F0390 Unspecified dementia without behavioral disturbance: Secondary | ICD-10-CM | POA: Diagnosis not present

## 2019-05-20 DIAGNOSIS — M6281 Muscle weakness (generalized): Secondary | ICD-10-CM | POA: Diagnosis not present

## 2019-05-20 DIAGNOSIS — R278 Other lack of coordination: Secondary | ICD-10-CM | POA: Diagnosis not present

## 2019-05-21 DIAGNOSIS — R278 Other lack of coordination: Secondary | ICD-10-CM | POA: Diagnosis not present

## 2019-05-21 DIAGNOSIS — F0390 Unspecified dementia without behavioral disturbance: Secondary | ICD-10-CM | POA: Diagnosis not present

## 2019-05-21 DIAGNOSIS — Z741 Need for assistance with personal care: Secondary | ICD-10-CM | POA: Diagnosis not present

## 2019-05-21 DIAGNOSIS — M6281 Muscle weakness (generalized): Secondary | ICD-10-CM | POA: Diagnosis not present

## 2019-05-22 DIAGNOSIS — M6281 Muscle weakness (generalized): Secondary | ICD-10-CM | POA: Diagnosis not present

## 2019-05-22 DIAGNOSIS — Z741 Need for assistance with personal care: Secondary | ICD-10-CM | POA: Diagnosis not present

## 2019-05-22 DIAGNOSIS — F0390 Unspecified dementia without behavioral disturbance: Secondary | ICD-10-CM | POA: Diagnosis not present

## 2019-05-22 DIAGNOSIS — R278 Other lack of coordination: Secondary | ICD-10-CM | POA: Diagnosis not present

## 2019-05-24 DIAGNOSIS — N320 Bladder-neck obstruction: Secondary | ICD-10-CM | POA: Diagnosis not present

## 2019-05-25 DIAGNOSIS — Z741 Need for assistance with personal care: Secondary | ICD-10-CM | POA: Diagnosis not present

## 2019-05-25 DIAGNOSIS — M6281 Muscle weakness (generalized): Secondary | ICD-10-CM | POA: Diagnosis not present

## 2019-05-25 DIAGNOSIS — R278 Other lack of coordination: Secondary | ICD-10-CM | POA: Diagnosis not present

## 2019-05-25 DIAGNOSIS — F0390 Unspecified dementia without behavioral disturbance: Secondary | ICD-10-CM | POA: Diagnosis not present

## 2019-05-26 DIAGNOSIS — F0390 Unspecified dementia without behavioral disturbance: Secondary | ICD-10-CM | POA: Diagnosis not present

## 2019-05-26 DIAGNOSIS — M6281 Muscle weakness (generalized): Secondary | ICD-10-CM | POA: Diagnosis not present

## 2019-05-26 DIAGNOSIS — R278 Other lack of coordination: Secondary | ICD-10-CM | POA: Diagnosis not present

## 2019-05-26 DIAGNOSIS — Z741 Need for assistance with personal care: Secondary | ICD-10-CM | POA: Diagnosis not present

## 2019-05-27 DIAGNOSIS — M6281 Muscle weakness (generalized): Secondary | ICD-10-CM | POA: Diagnosis not present

## 2019-05-27 DIAGNOSIS — Z741 Need for assistance with personal care: Secondary | ICD-10-CM | POA: Diagnosis not present

## 2019-05-27 DIAGNOSIS — R278 Other lack of coordination: Secondary | ICD-10-CM | POA: Diagnosis not present

## 2019-05-27 DIAGNOSIS — F0390 Unspecified dementia without behavioral disturbance: Secondary | ICD-10-CM | POA: Diagnosis not present

## 2019-05-28 DIAGNOSIS — G8191 Hemiplegia, unspecified affecting right dominant side: Secondary | ICD-10-CM | POA: Diagnosis not present

## 2019-05-28 DIAGNOSIS — Z741 Need for assistance with personal care: Secondary | ICD-10-CM | POA: Diagnosis not present

## 2019-05-28 DIAGNOSIS — I480 Paroxysmal atrial fibrillation: Secondary | ICD-10-CM | POA: Diagnosis not present

## 2019-05-28 DIAGNOSIS — F39 Unspecified mood [affective] disorder: Secondary | ICD-10-CM | POA: Diagnosis not present

## 2019-05-28 DIAGNOSIS — F0390 Unspecified dementia without behavioral disturbance: Secondary | ICD-10-CM | POA: Diagnosis not present

## 2019-05-28 DIAGNOSIS — R278 Other lack of coordination: Secondary | ICD-10-CM | POA: Diagnosis not present

## 2019-05-28 DIAGNOSIS — M6281 Muscle weakness (generalized): Secondary | ICD-10-CM | POA: Diagnosis not present

## 2019-05-28 DIAGNOSIS — F0150 Vascular dementia without behavioral disturbance: Secondary | ICD-10-CM | POA: Diagnosis not present

## 2019-05-29 DIAGNOSIS — Z741 Need for assistance with personal care: Secondary | ICD-10-CM | POA: Diagnosis not present

## 2019-05-29 DIAGNOSIS — M6281 Muscle weakness (generalized): Secondary | ICD-10-CM | POA: Diagnosis not present

## 2019-05-29 DIAGNOSIS — R278 Other lack of coordination: Secondary | ICD-10-CM | POA: Diagnosis not present

## 2019-05-29 DIAGNOSIS — F0390 Unspecified dementia without behavioral disturbance: Secondary | ICD-10-CM | POA: Diagnosis not present

## 2019-06-01 DIAGNOSIS — M6281 Muscle weakness (generalized): Secondary | ICD-10-CM | POA: Diagnosis not present

## 2019-06-01 DIAGNOSIS — R278 Other lack of coordination: Secondary | ICD-10-CM | POA: Diagnosis not present

## 2019-06-01 DIAGNOSIS — F0390 Unspecified dementia without behavioral disturbance: Secondary | ICD-10-CM | POA: Diagnosis not present

## 2019-06-01 DIAGNOSIS — Z741 Need for assistance with personal care: Secondary | ICD-10-CM | POA: Diagnosis not present

## 2019-06-02 DIAGNOSIS — R278 Other lack of coordination: Secondary | ICD-10-CM | POA: Diagnosis not present

## 2019-06-02 DIAGNOSIS — F0390 Unspecified dementia without behavioral disturbance: Secondary | ICD-10-CM | POA: Diagnosis not present

## 2019-06-02 DIAGNOSIS — Z741 Need for assistance with personal care: Secondary | ICD-10-CM | POA: Diagnosis not present

## 2019-06-02 DIAGNOSIS — M6281 Muscle weakness (generalized): Secondary | ICD-10-CM | POA: Diagnosis not present

## 2019-06-03 DIAGNOSIS — F0390 Unspecified dementia without behavioral disturbance: Secondary | ICD-10-CM | POA: Diagnosis not present

## 2019-06-03 DIAGNOSIS — R278 Other lack of coordination: Secondary | ICD-10-CM | POA: Diagnosis not present

## 2019-06-03 DIAGNOSIS — Z741 Need for assistance with personal care: Secondary | ICD-10-CM | POA: Diagnosis not present

## 2019-06-03 DIAGNOSIS — M6281 Muscle weakness (generalized): Secondary | ICD-10-CM | POA: Diagnosis not present

## 2019-06-04 DIAGNOSIS — M6281 Muscle weakness (generalized): Secondary | ICD-10-CM | POA: Diagnosis not present

## 2019-06-04 DIAGNOSIS — R278 Other lack of coordination: Secondary | ICD-10-CM | POA: Diagnosis not present

## 2019-06-04 DIAGNOSIS — Z741 Need for assistance with personal care: Secondary | ICD-10-CM | POA: Diagnosis not present

## 2019-06-04 DIAGNOSIS — F0390 Unspecified dementia without behavioral disturbance: Secondary | ICD-10-CM | POA: Diagnosis not present

## 2019-06-05 DIAGNOSIS — F0390 Unspecified dementia without behavioral disturbance: Secondary | ICD-10-CM | POA: Diagnosis not present

## 2019-06-05 DIAGNOSIS — Z741 Need for assistance with personal care: Secondary | ICD-10-CM | POA: Diagnosis not present

## 2019-06-05 DIAGNOSIS — M6281 Muscle weakness (generalized): Secondary | ICD-10-CM | POA: Diagnosis not present

## 2019-06-05 DIAGNOSIS — R278 Other lack of coordination: Secondary | ICD-10-CM | POA: Diagnosis not present

## 2019-06-08 DIAGNOSIS — R278 Other lack of coordination: Secondary | ICD-10-CM | POA: Diagnosis not present

## 2019-06-08 DIAGNOSIS — F0390 Unspecified dementia without behavioral disturbance: Secondary | ICD-10-CM | POA: Diagnosis not present

## 2019-06-08 DIAGNOSIS — Z741 Need for assistance with personal care: Secondary | ICD-10-CM | POA: Diagnosis not present

## 2019-06-08 DIAGNOSIS — M6281 Muscle weakness (generalized): Secondary | ICD-10-CM | POA: Diagnosis not present

## 2019-06-09 DIAGNOSIS — F0390 Unspecified dementia without behavioral disturbance: Secondary | ICD-10-CM | POA: Diagnosis not present

## 2019-06-09 DIAGNOSIS — M6281 Muscle weakness (generalized): Secondary | ICD-10-CM | POA: Diagnosis not present

## 2019-06-09 DIAGNOSIS — R278 Other lack of coordination: Secondary | ICD-10-CM | POA: Diagnosis not present

## 2019-06-09 DIAGNOSIS — Z741 Need for assistance with personal care: Secondary | ICD-10-CM | POA: Diagnosis not present

## 2019-06-10 DIAGNOSIS — F0390 Unspecified dementia without behavioral disturbance: Secondary | ICD-10-CM | POA: Diagnosis not present

## 2019-06-10 DIAGNOSIS — M6281 Muscle weakness (generalized): Secondary | ICD-10-CM | POA: Diagnosis not present

## 2019-06-10 DIAGNOSIS — R278 Other lack of coordination: Secondary | ICD-10-CM | POA: Diagnosis not present

## 2019-06-10 DIAGNOSIS — Z741 Need for assistance with personal care: Secondary | ICD-10-CM | POA: Diagnosis not present

## 2019-06-11 DIAGNOSIS — R319 Hematuria, unspecified: Secondary | ICD-10-CM | POA: Diagnosis not present

## 2019-06-11 DIAGNOSIS — F0390 Unspecified dementia without behavioral disturbance: Secondary | ICD-10-CM | POA: Diagnosis not present

## 2019-06-11 DIAGNOSIS — Z741 Need for assistance with personal care: Secondary | ICD-10-CM | POA: Diagnosis not present

## 2019-06-11 DIAGNOSIS — M6281 Muscle weakness (generalized): Secondary | ICD-10-CM | POA: Diagnosis not present

## 2019-06-11 DIAGNOSIS — R278 Other lack of coordination: Secondary | ICD-10-CM | POA: Diagnosis not present

## 2019-06-12 DIAGNOSIS — Z741 Need for assistance with personal care: Secondary | ICD-10-CM | POA: Diagnosis not present

## 2019-06-12 DIAGNOSIS — F0390 Unspecified dementia without behavioral disturbance: Secondary | ICD-10-CM | POA: Diagnosis not present

## 2019-06-12 DIAGNOSIS — R278 Other lack of coordination: Secondary | ICD-10-CM | POA: Diagnosis not present

## 2019-06-12 DIAGNOSIS — M6281 Muscle weakness (generalized): Secondary | ICD-10-CM | POA: Diagnosis not present

## 2019-07-02 DIAGNOSIS — I10 Essential (primary) hypertension: Secondary | ICD-10-CM | POA: Diagnosis not present

## 2019-07-16 ENCOUNTER — Other Ambulatory Visit: Payer: Medicare Other | Admitting: Urology

## 2019-07-17 ENCOUNTER — Other Ambulatory Visit: Payer: Medicare Other | Admitting: Urology

## 2019-07-21 DIAGNOSIS — B342 Coronavirus infection, unspecified: Secondary | ICD-10-CM | POA: Diagnosis not present

## 2019-08-05 DIAGNOSIS — F39 Unspecified mood [affective] disorder: Secondary | ICD-10-CM | POA: Diagnosis not present

## 2019-08-05 DIAGNOSIS — I69354 Hemiplegia and hemiparesis following cerebral infarction affecting left non-dominant side: Secondary | ICD-10-CM | POA: Diagnosis not present

## 2019-08-05 DIAGNOSIS — E1159 Type 2 diabetes mellitus with other circulatory complications: Secondary | ICD-10-CM

## 2019-08-05 DIAGNOSIS — K219 Gastro-esophageal reflux disease without esophagitis: Secondary | ICD-10-CM | POA: Diagnosis not present

## 2019-08-05 DIAGNOSIS — I4891 Unspecified atrial fibrillation: Secondary | ICD-10-CM | POA: Diagnosis not present

## 2019-08-05 DIAGNOSIS — N401 Enlarged prostate with lower urinary tract symptoms: Secondary | ICD-10-CM

## 2019-08-05 DIAGNOSIS — I10 Essential (primary) hypertension: Secondary | ICD-10-CM

## 2019-08-05 DIAGNOSIS — F0150 Vascular dementia without behavioral disturbance: Secondary | ICD-10-CM | POA: Diagnosis not present

## 2019-08-13 ENCOUNTER — Ambulatory Visit (INDEPENDENT_AMBULATORY_CARE_PROVIDER_SITE_OTHER): Payer: Medicare Other | Admitting: Vascular Surgery

## 2019-08-13 ENCOUNTER — Encounter (INDEPENDENT_AMBULATORY_CARE_PROVIDER_SITE_OTHER): Payer: Medicare Other

## 2019-08-27 ENCOUNTER — Other Ambulatory Visit: Payer: Self-pay

## 2019-08-27 ENCOUNTER — Ambulatory Visit (INDEPENDENT_AMBULATORY_CARE_PROVIDER_SITE_OTHER): Payer: Medicare Other | Admitting: Vascular Surgery

## 2019-08-27 ENCOUNTER — Ambulatory Visit (INDEPENDENT_AMBULATORY_CARE_PROVIDER_SITE_OTHER): Payer: Medicare Other

## 2019-08-27 ENCOUNTER — Encounter (INDEPENDENT_AMBULATORY_CARE_PROVIDER_SITE_OTHER): Payer: Self-pay | Admitting: Vascular Surgery

## 2019-08-27 VITALS — BP 162/94 | HR 64 | Resp 12 | Ht 72.0 in | Wt 248.0 lb

## 2019-08-27 DIAGNOSIS — E782 Mixed hyperlipidemia: Secondary | ICD-10-CM | POA: Diagnosis not present

## 2019-08-27 DIAGNOSIS — I714 Abdominal aortic aneurysm, without rupture, unspecified: Secondary | ICD-10-CM

## 2019-08-27 DIAGNOSIS — Z794 Long term (current) use of insulin: Secondary | ICD-10-CM | POA: Diagnosis not present

## 2019-08-27 DIAGNOSIS — I724 Aneurysm of artery of lower extremity: Secondary | ICD-10-CM

## 2019-08-27 DIAGNOSIS — E1142 Type 2 diabetes mellitus with diabetic polyneuropathy: Secondary | ICD-10-CM | POA: Diagnosis not present

## 2019-08-27 DIAGNOSIS — I10 Essential (primary) hypertension: Secondary | ICD-10-CM

## 2019-08-27 NOTE — Progress Notes (Signed)
MRN : [id number]  Benjamin Grant is a 85 y.o. (1930-01-22) male who presents with chief complaint of  Chief Complaint  Patient presents with  . Follow-up  .  History of Present Illness:   The patient returns to the office for surveillance of an abdominal aortic aneurysm status post stent graft placement.   Patient denies abdominal pain or back pain, no other abdominal complaints. No groin related complaints. No symptoms consistent with distal embolization No changes in claudication distance.   There have been no interval changes in his overall healthcare since his last visit.   Patient denies amaurosis fugax or TIA symptoms. There is no history of claudication or rest pain symptoms of the lower extremities. The patient denies angina or shortness of breath.   Duplex US of the aorta and iliac arteries shows a2.7 cm AAA sac with noendoleak, decreas in the sac compared to the previous study.  Previous duplex showed a3.3 cm AAA sac   Current Meds  Medication Sig  . acetaminophen (TYLENOL) 500 MG tablet Take 1,000 mg by mouth every 4 (four) hours as needed for moderate pain or fever.   . diltiazem (CARDIZEM CD) 180 MG 24 hr capsule   . donepezil (ARICEPT) 10 MG tablet Take 10 mg by mouth at bedtime.  Marland Kitchen ELIQUIS 2.5 MG TABS tablet   . famotidine (PEPCID) 20 MG tablet Take 20 mg by mouth 2 (two) times daily.  . finasteride (PROSCAR) 5 MG tablet Take 5 mg by mouth every morning.   Marland Kitchen glipiZIDE (GLUCOTROL) 10 MG tablet Take 10 mg by mouth 2 (two) times daily.  Marland Kitchen GOODSENSE GAS RELIEF 125 MG chewable tablet   . insulin glargine (LANTUS) 100 UNIT/ML injection Inject 34 Units into the skin at bedtime.   Marland Kitchen lisinopril (PRINIVIL,ZESTRIL) 10 MG tablet Take 10 mg by mouth every morning.   . memantine (NAMENDA) 10 MG tablet Take 10 mg by mouth 2 (two) times daily.  . metoprolol tartrate (LOPRESSOR) 25 MG tablet Take 12.5 mg by mouth every 12 (twelve) hours.   . simvastatin (ZOCOR) 10 MG  tablet Take 10 mg by mouth every evening.   . sitaGLIPtin (JANUVIA) 100 MG tablet Take 100 mg by mouth every morning.   . sodium chloride (OCEAN) 0.65 % SOLN nasal spray Place 1 spray into both nostrils See admin instructions. Place 1 spray in each nostril 4 times daily. May use 1 spray in each nostril every 2 hours as needed for dryness. May leave in room per [facility].  . tamsulosin (FLOMAX) 0.4 MG CAPS capsule Take 0.4 mg by mouth every morning.    Current Facility-Administered Medications for the 08/27/19 encounter (Office Visit) with Delana Meyer, Dolores Lory, MD  Medication  . lidocaine (XYLOCAINE) 2 % jelly 1 application    Past Medical History:  Diagnosis Date  . AAA (abdominal aortic aneurysm) ([place])   . 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 Atrial fibrillation ([place])   . Dementia ([place])   . Depression   . Diabetes mellitus without complication ([place])   . Diverticulosis large intestine w/o perforation or abscess w/bleeding   . GERD without esophagitis   . Gross hematuria   . Hyperlipidemia   . Hypertension   . Ventricular tachycardia [facility])     Past Surgical History:  Procedure Laterality Date  . TRANSURETHRAL RESECTION OF BLADDER TUMOR WITH MITOMYCIN-C N/A 05/09/2018   Procedure: TRANSURETHRAL RESECTION OF BLADDER TUMOR WITH Gemcitabine;  Surgeon: Abbie Sons, MD;  Location: [facility] ORS;  Service: Urology;  Laterality: N/A;    Social History Social History   Tobacco Use  . Smoking status: Former Research scientist (life sciences)  . Smokeless tobacco: Never Used  Substance Use Topics  . Alcohol use: Not Currently  . Drug use: Never    Family History History reviewed. No pertinent family history.  Allergies  Allergen Reactions  . Penicillins     Per MAR     REVIEW OF SYSTEMS (Negative unless checked)  Constitutional: [] Weight loss  [] Fever  [] Chills Cardiac: [] Chest pain   [] Chest pressure   [] Palpitations   [] Shortness of breath when laying flat   [] Shortness of breath with exertion.  Vascular:  [] Pain in legs with walking   [] Pain in legs at rest  [] History of DVT   [] Phlebitis   [] Swelling in legs   [] Varicose veins   [] Non-healing ulcers Pulmonary:   [] Uses home oxygen   [] Productive cough   [] Hemoptysis   [] Wheeze  [] COPD   [] Asthma Neurologic:  [] Dizziness   [] Seizures   [] History of stroke   [] History of TIA  [] Aphasia   [] Vissual changes   [] Weakness or numbness in arm   [] Weakness or numbness in leg Musculoskeletal:   [] Joint swelling   [] Joint pain   [] Low back pain Hematologic:  [] Easy bruising  [] Easy bleeding   [] Hypercoagulable state   [] Anemic Gastrointestinal:  [] Diarrhea   [] Vomiting  [] Gastroesophageal reflux/heartburn   [] Difficulty swallowing. Genitourinary:  [] Chronic kidney disease   [] Difficult urination  [] Frequent urination   [] Blood in urine Skin:  [] Rashes   [] Ulcers  Psychological:  [] History of anxiety   []  History of major depression.  Physical Examination  Vitals:   08/27/19 0815  BP: (!) 162/94  Pulse: 64  Resp: 12  Weight: 248 lb (112.5 kg)  Height: 6' (1.829 m)   Body mass index is 33.63 kg/m. Gen: WD/WN, NAD seen in a wheelchair  head: [place]/AT, No temporalis wasting.  Ear/Nose/Throat: Hearing grossly intact, nares w/o erythema or drainage Eyes: PER, EOMI, sclera nonicteric.  Neck: Supple, no large masses.   Pulmonary:  Good air movement, no audible wheezing bilaterally, no use of accessory muscles.  Cardiac: RRR, no JVD Vascular:  Vessel Right Left  Radial Palpable Palpable  Gastrointestinal: Non-distended. No guarding/no peritoneal signs.  Musculoskeletal: M/S 5/5 throughout.  No deformity or atrophy.  Neurologic: CN 2-12 intact. Symmetrical.  Speech is fluent. Motor exam as listed above. Psychiatric: Judgment intact, Mood & affect appropriate for pt's clinical situation. Dermatologic: No rashes or ulcers noted.  No changes consistent with cellulitis. Lymph : No lichenification or skin changes of chronic lymphedema.  CBC  Lab Results  Component Value Date   WBC 8.6 01/16/2014   HGB 12.3 (L) 01/16/2014   HCT 36.5 (L) 01/16/2014   MCV 89 01/16/2014   PLT 145 (L) 01/16/2014    BMET    Component Value Date/Time   NA 136 05/02/2018 1257   NA 131 (L) 01/17/2014 0359   K 3.7 05/02/2018 1257   K 3.9 01/17/2014 0359   CL 101 05/02/2018 1257   CL 100 01/17/2014 0359   CO2 27 05/02/2018 1257   CO2 28 01/17/2014 0359   GLUCOSE 210 (H) 05/02/2018 1257   GLUCOSE 245 (H) 01/17/2014 0359   BUN 16 05/02/2018 1257   BUN 13 03/25/2018 1528   BUN 18 01/17/2014 0359   CREATININE 0.80 05/02/2018 1257   CREATININE 0.94 01/17/2014 0359   CALCIUM 9.3 05/02/2018 1257   CALCIUM  8.7 01/17/2014 0359   GFRNONAA >60 05/02/2018 1257   GFRNONAA >60 01/17/2014 0359   GFRAA >60 05/02/2018 1257   GFRAA >60 01/17/2014 0359   CrCl cannot be calculated (Patient's most recent lab result is older than the maximum 21 days allowed.).  COAG Lab Results  Component Value Date   INR 0.9 07/13/2012   INR 0.9 06/04/2012   INR 1.2 02/29/2012    Radiology No results found.  Assessment/Plan 1. AAA (abdominal aortic aneurysm) without rupture (HCC) Recommend: Patient is status post successful endovascular repair of the AAA.   No further intervention is required at this time.   No endoleak is detected and the aneurysm sac is stable.  The patient will continue antiplatelet therapy as prescribed as well as aggressive management of hyperlipidemia. Exercise is again strongly encouraged.   However, endografts require continued surveillance with ultrasound or CT scan. This is mandatory to detect any changes that allow repressurization of the aneurysm sac.  The patient is informed that this would be asymptomatic.  The patient is reminded that lifelong routine surveillance is a necessity with an endograft. Patient will continue to follow-up at 6 month intervals with ultrasound of the aorta. - VAS US AORTA/IVC/ILIACS; Future  2.  Popliteal artery aneurysm (HCC) Continue to follow with ultraosund  No intervention  3. Essential hypertension Continue antihypertensive medications as already ordered, these medications have been reviewed and there are no changes at this time.   4. Type 2 diabetes mellitus with diabetic polyneuropathy, with long-term current use of insulin (HCC) Continue hypoglycemic medications as already ordered, these medications have been reviewed and there are no changes at this time.  Hgb A1C to be monitored as already arranged by primary service   5. Mixed hyperlipidemia Continue statin as ordered and reviewed, no changes at this time     Hortencia Pilar, MD  08/27/2019 8:43 AM

## 2019-09-09 DIAGNOSIS — B351 Tinea unguium: Secondary | ICD-10-CM | POA: Diagnosis not present

## 2019-09-29 DIAGNOSIS — I480 Paroxysmal atrial fibrillation: Secondary | ICD-10-CM | POA: Diagnosis not present

## 2019-09-29 DIAGNOSIS — G8191 Hemiplegia, unspecified affecting right dominant side: Secondary | ICD-10-CM | POA: Diagnosis not present

## 2019-09-29 DIAGNOSIS — F0150 Vascular dementia without behavioral disturbance: Secondary | ICD-10-CM | POA: Diagnosis not present

## 2019-09-29 DIAGNOSIS — E1159 Type 2 diabetes mellitus with other circulatory complications: Secondary | ICD-10-CM | POA: Diagnosis not present

## 2019-09-29 DIAGNOSIS — K219 Gastro-esophageal reflux disease without esophagitis: Secondary | ICD-10-CM

## 2019-09-29 DIAGNOSIS — I714 Abdominal aortic aneurysm, without rupture: Secondary | ICD-10-CM

## 2019-10-26 DIAGNOSIS — Z79899 Other long term (current) drug therapy: Secondary | ICD-10-CM | POA: Diagnosis not present

## 2019-10-26 DIAGNOSIS — D649 Anemia, unspecified: Secondary | ICD-10-CM | POA: Diagnosis not present

## 2019-10-26 DIAGNOSIS — E119 Type 2 diabetes mellitus without complications: Secondary | ICD-10-CM | POA: Diagnosis not present

## 2019-10-26 DIAGNOSIS — I10 Essential (primary) hypertension: Secondary | ICD-10-CM | POA: Diagnosis not present

## 2019-10-26 DIAGNOSIS — E785 Hyperlipidemia, unspecified: Secondary | ICD-10-CM | POA: Diagnosis not present

## 2019-11-25 DIAGNOSIS — I714 Abdominal aortic aneurysm, without rupture: Secondary | ICD-10-CM | POA: Diagnosis not present

## 2019-11-25 DIAGNOSIS — I69351 Hemiplegia and hemiparesis following cerebral infarction affecting right dominant side: Secondary | ICD-10-CM | POA: Diagnosis not present

## 2019-11-25 DIAGNOSIS — I10 Essential (primary) hypertension: Secondary | ICD-10-CM

## 2019-11-25 DIAGNOSIS — I4891 Unspecified atrial fibrillation: Secondary | ICD-10-CM | POA: Diagnosis not present

## 2019-11-25 DIAGNOSIS — K219 Gastro-esophageal reflux disease without esophagitis: Secondary | ICD-10-CM

## 2019-11-25 DIAGNOSIS — F39 Unspecified mood [affective] disorder: Secondary | ICD-10-CM | POA: Diagnosis not present

## 2019-11-25 DIAGNOSIS — E1151 Type 2 diabetes mellitus with diabetic peripheral angiopathy without gangrene: Secondary | ICD-10-CM | POA: Diagnosis not present

## 2019-11-25 DIAGNOSIS — N401 Enlarged prostate with lower urinary tract symptoms: Secondary | ICD-10-CM

## 2019-11-25 DIAGNOSIS — F0150 Vascular dementia without behavioral disturbance: Secondary | ICD-10-CM | POA: Diagnosis not present

## 2019-12-09 DIAGNOSIS — B351 Tinea unguium: Secondary | ICD-10-CM

## 2020-01-03 DIAGNOSIS — Z23 Encounter for immunization: Secondary | ICD-10-CM | POA: Diagnosis not present

## 2020-01-28 DIAGNOSIS — F0150 Vascular dementia without behavioral disturbance: Secondary | ICD-10-CM | POA: Diagnosis not present

## 2020-01-28 DIAGNOSIS — E1159 Type 2 diabetes mellitus with other circulatory complications: Secondary | ICD-10-CM | POA: Diagnosis not present

## 2020-01-28 DIAGNOSIS — F39 Unspecified mood [affective] disorder: Secondary | ICD-10-CM

## 2020-01-28 DIAGNOSIS — G8191 Hemiplegia, unspecified affecting right dominant side: Secondary | ICD-10-CM | POA: Diagnosis not present

## 2020-01-28 DIAGNOSIS — I714 Abdominal aortic aneurysm, without rupture: Secondary | ICD-10-CM

## 2020-01-28 DIAGNOSIS — I480 Paroxysmal atrial fibrillation: Secondary | ICD-10-CM | POA: Diagnosis not present

## 2020-01-31 DIAGNOSIS — Z23 Encounter for immunization: Secondary | ICD-10-CM | POA: Diagnosis not present

## 2020-02-10 DIAGNOSIS — B351 Tinea unguium: Secondary | ICD-10-CM | POA: Diagnosis not present

## 2020-04-06 DIAGNOSIS — E1159 Type 2 diabetes mellitus with other circulatory complications: Secondary | ICD-10-CM

## 2020-04-06 DIAGNOSIS — N401 Enlarged prostate with lower urinary tract symptoms: Secondary | ICD-10-CM

## 2020-04-06 DIAGNOSIS — K219 Gastro-esophageal reflux disease without esophagitis: Secondary | ICD-10-CM

## 2020-04-06 DIAGNOSIS — I10 Essential (primary) hypertension: Secondary | ICD-10-CM | POA: Diagnosis not present

## 2020-04-06 DIAGNOSIS — I714 Abdominal aortic aneurysm, without rupture: Secondary | ICD-10-CM

## 2020-04-06 DIAGNOSIS — F0150 Vascular dementia without behavioral disturbance: Secondary | ICD-10-CM | POA: Diagnosis not present

## 2020-04-06 DIAGNOSIS — I4891 Unspecified atrial fibrillation: Secondary | ICD-10-CM | POA: Diagnosis not present

## 2020-04-06 DIAGNOSIS — I69354 Hemiplegia and hemiparesis following cerebral infarction affecting left non-dominant side: Secondary | ICD-10-CM

## 2020-04-06 DIAGNOSIS — F39 Unspecified mood [affective] disorder: Secondary | ICD-10-CM | POA: Diagnosis not present

## 2020-04-22 DIAGNOSIS — B351 Tinea unguium: Secondary | ICD-10-CM | POA: Diagnosis not present

## 2020-04-28 DIAGNOSIS — E119 Type 2 diabetes mellitus without complications: Secondary | ICD-10-CM | POA: Diagnosis not present

## 2020-04-28 DIAGNOSIS — E785 Hyperlipidemia, unspecified: Secondary | ICD-10-CM | POA: Diagnosis not present

## 2020-04-28 DIAGNOSIS — D649 Anemia, unspecified: Secondary | ICD-10-CM | POA: Diagnosis not present

## 2020-06-02 DIAGNOSIS — F0150 Vascular dementia without behavioral disturbance: Secondary | ICD-10-CM | POA: Diagnosis not present

## 2020-06-02 DIAGNOSIS — G8191 Hemiplegia, unspecified affecting right dominant side: Secondary | ICD-10-CM | POA: Diagnosis not present

## 2020-06-02 DIAGNOSIS — I480 Paroxysmal atrial fibrillation: Secondary | ICD-10-CM | POA: Diagnosis not present

## 2020-06-02 DIAGNOSIS — E1159 Type 2 diabetes mellitus with other circulatory complications: Secondary | ICD-10-CM | POA: Diagnosis not present

## 2020-06-02 DIAGNOSIS — F22 Delusional disorders: Secondary | ICD-10-CM | POA: Diagnosis not present

## 2020-06-22 DIAGNOSIS — B351 Tinea unguium: Secondary | ICD-10-CM | POA: Diagnosis not present

## 2020-08-05 DIAGNOSIS — I714 Abdominal aortic aneurysm, without rupture: Secondary | ICD-10-CM | POA: Diagnosis not present

## 2020-08-05 DIAGNOSIS — I10 Essential (primary) hypertension: Secondary | ICD-10-CM | POA: Diagnosis not present

## 2020-08-05 DIAGNOSIS — K219 Gastro-esophageal reflux disease without esophagitis: Secondary | ICD-10-CM | POA: Diagnosis not present

## 2020-08-05 DIAGNOSIS — F0150 Vascular dementia without behavioral disturbance: Secondary | ICD-10-CM | POA: Diagnosis not present

## 2020-08-05 DIAGNOSIS — N401 Enlarged prostate with lower urinary tract symptoms: Secondary | ICD-10-CM | POA: Diagnosis not present

## 2020-08-05 DIAGNOSIS — F39 Unspecified mood [affective] disorder: Secondary | ICD-10-CM | POA: Diagnosis not present

## 2020-08-05 DIAGNOSIS — F22 Delusional disorders: Secondary | ICD-10-CM | POA: Diagnosis not present

## 2020-08-05 DIAGNOSIS — E1159 Type 2 diabetes mellitus with other circulatory complications: Secondary | ICD-10-CM | POA: Diagnosis not present

## 2020-08-05 DIAGNOSIS — I69354 Hemiplegia and hemiparesis following cerebral infarction affecting left non-dominant side: Secondary | ICD-10-CM | POA: Diagnosis not present

## 2020-08-05 DIAGNOSIS — I4891 Unspecified atrial fibrillation: Secondary | ICD-10-CM | POA: Diagnosis not present

## 2020-09-05 ENCOUNTER — Encounter (INDEPENDENT_AMBULATORY_CARE_PROVIDER_SITE_OTHER): Payer: Medicare Other

## 2020-09-05 ENCOUNTER — Ambulatory Visit (INDEPENDENT_AMBULATORY_CARE_PROVIDER_SITE_OTHER): Payer: Medicare Other | Admitting: Vascular Surgery

## 2020-09-16 DIAGNOSIS — B351 Tinea unguium: Secondary | ICD-10-CM | POA: Diagnosis not present

## 2020-10-11 DIAGNOSIS — I480 Paroxysmal atrial fibrillation: Secondary | ICD-10-CM | POA: Diagnosis not present

## 2020-10-11 DIAGNOSIS — I5032 Chronic diastolic (congestive) heart failure: Secondary | ICD-10-CM | POA: Diagnosis not present

## 2020-10-11 DIAGNOSIS — F0150 Vascular dementia without behavioral disturbance: Secondary | ICD-10-CM | POA: Diagnosis not present

## 2020-10-11 DIAGNOSIS — E1159 Type 2 diabetes mellitus with other circulatory complications: Secondary | ICD-10-CM | POA: Diagnosis not present

## 2020-10-11 DIAGNOSIS — G8191 Hemiplegia, unspecified affecting right dominant side: Secondary | ICD-10-CM | POA: Diagnosis not present

## 2020-10-11 DIAGNOSIS — F22 Delusional disorders: Secondary | ICD-10-CM | POA: Diagnosis not present

## 2020-11-14 DIAGNOSIS — F0151 Vascular dementia with behavioral disturbance: Secondary | ICD-10-CM

## 2020-11-14 DIAGNOSIS — F22 Delusional disorders: Secondary | ICD-10-CM

## 2020-11-14 DIAGNOSIS — E1159 Type 2 diabetes mellitus with other circulatory complications: Secondary | ICD-10-CM

## 2020-11-14 DIAGNOSIS — I480 Paroxysmal atrial fibrillation: Secondary | ICD-10-CM

## 2020-11-14 DIAGNOSIS — G8191 Hemiplegia, unspecified affecting right dominant side: Secondary | ICD-10-CM

## 2020-11-24 ENCOUNTER — Other Ambulatory Visit: Payer: Self-pay

## 2020-11-24 ENCOUNTER — Ambulatory Visit (INDEPENDENT_AMBULATORY_CARE_PROVIDER_SITE_OTHER): Payer: Medicaid Other

## 2020-11-24 ENCOUNTER — Encounter (INDEPENDENT_AMBULATORY_CARE_PROVIDER_SITE_OTHER): Payer: Self-pay | Admitting: Vascular Surgery

## 2020-11-24 ENCOUNTER — Ambulatory Visit (INDEPENDENT_AMBULATORY_CARE_PROVIDER_SITE_OTHER): Payer: Medicaid Other | Admitting: Vascular Surgery

## 2020-11-24 VITALS — BP 148/97 | HR 91

## 2020-11-24 DIAGNOSIS — I724 Aneurysm of artery of lower extremity: Secondary | ICD-10-CM | POA: Diagnosis not present

## 2020-11-24 DIAGNOSIS — E1142 Type 2 diabetes mellitus with diabetic polyneuropathy: Secondary | ICD-10-CM | POA: Diagnosis not present

## 2020-11-24 DIAGNOSIS — I714 Abdominal aortic aneurysm, without rupture, unspecified: Secondary | ICD-10-CM

## 2020-11-24 DIAGNOSIS — Z794 Long term (current) use of insulin: Secondary | ICD-10-CM

## 2020-11-24 DIAGNOSIS — I10 Essential (primary) hypertension: Secondary | ICD-10-CM

## 2020-11-24 DIAGNOSIS — E782 Mixed hyperlipidemia: Secondary | ICD-10-CM

## 2020-11-24 NOTE — Progress Notes (Signed)
MRN : [id number]  Benjamin Grant is a 86 y.o. (09/23/1930) male who presents with chief complaint of annual follow up.  History of Present Illness:   The patient returns to the office for surveillance of an abdominal aortic aneurysm status post stent graft placement.   Patient denies abdominal pain or back pain, no other abdominal complaints. No groin related complaints. No symptoms consistent with distal embolization No changes in claudication distance.   There have been no interval changes in his overall healthcare since his last visit.   Patient denies amaurosis fugax or TIA symptoms. There is no history of claudication or rest pain symptoms of the lower extremities. The patient denies angina or shortness of breath.   Duplex US of the aorta and iliac arteries shows a2.7 cmAAA sac with noendoleak,decreasin the sac compared to the previous study.  Previous duplex showed a3.3 cmAAA sac   No outpatient medications have been marked as taking for the 11/24/20 encounter (Appointment) with Delana Meyer, Dolores Lory, MD.   Current Facility-Administered Medications for the 11/24/20 encounter (Appointment) with Delana Meyer, Dolores Lory, MD  Medication  . lidocaine (XYLOCAINE) 2 % jelly 1 application    Past Medical History:  Diagnosis Date  . AAA (abdominal aortic aneurysm) ([place])   . 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 Atrial fibrillation ([place])   . Dementia ([place])   . Depression   . Diabetes mellitus without complication ([place])   . Diverticulosis large intestine w/o perforation or abscess w/bleeding   . GERD without esophagitis   . Gross hematuria   . Hyperlipidemia   . Hypertension   . Ventricular tachycardia [facility])     Past Surgical History:  Procedure Laterality Date  . TRANSURETHRAL RESECTION OF BLADDER TUMOR WITH MITOMYCIN-C N/A 05/09/2018   Procedure: TRANSURETHRAL RESECTION OF BLADDER TUMOR WITH Gemcitabine;  Surgeon: Abbie Sons, MD;  Location: [facility] ORS;   Service: Urology;  Laterality: N/A;    Social History Social History   Tobacco Use  . Smoking status: Former Research scientist (life sciences)  . Smokeless tobacco: Never Used  Vaping Use  . Vaping Use: Never used  Substance Use Topics  . Alcohol use: Not Currently  . Drug use: Never    Family History No family history on file.  Allergies  Allergen Reactions  . Penicillins     Per MAR     REVIEW OF SYSTEMS (Negative unless checked)  Constitutional: [] Weight loss  [] Fever  [] Chills Cardiac: [] Chest pain   [] Chest pressure   [] Palpitations   [] Shortness of breath when laying flat   [] Shortness of breath with exertion. Vascular:  [] Pain in legs with walking   [] Pain in legs at rest  [] History of DVT   [] Phlebitis   [] Swelling in legs   [] Varicose veins   [] Non-healing ulcers Pulmonary:   [] Uses home oxygen   [] Productive cough   [] Hemoptysis   [] Wheeze  [] COPD   [] Asthma Neurologic:  [] Dizziness   [] Seizures   [] History of stroke   [] History of TIA  [] Aphasia   [] Vissual changes   [] Weakness or numbness in arm   [x] Weakness or numbness in leg Musculoskeletal:   [] Joint swelling   [] Joint pain   [x] Low back pain Hematologic:  [] Easy bruising  [] Easy bleeding   [] Hypercoagulable state   [] Anemic Gastrointestinal:  [] Diarrhea   [] Vomiting  [] Gastroesophageal reflux/heartburn   [] Difficulty swallowing. Genitourinary:  [] Chronic kidney disease   [] Difficult urination  [] Frequent urination   [] Blood in urine Skin:  [] Rashes   []   Ulcers  Psychological:  [] History of anxiety   []  History of major depression.  Physical Examination  There were no vitals filed for this visit. There is no height or weight on file to calculate BMI. Gen: WD/WN, NAD seen in a wheelchair Head: [place]/AT, No temporalis wasting.  Ear/Nose/Throat: Hearing grossly intact, nares w/o erythema or drainage Eyes: PER, EOMI, sclera nonicteric.  Neck: Supple, no large masses.   Pulmonary:  Good air movement, no audible wheezing bilaterally, no  use of accessory muscles.  Cardiac: RRR, no JVD Vascular: 3+ edema bilaterally Vessel Right Left  Radial Palpable Palpable  Popliteal Palpable Palpable  Gastrointestinal: Non-distended. No guarding/no peritoneal signs.  Musculoskeletal: M/S 5/5 throughout.  No deformity or atrophy.  Neurologic: CN 2-12 intact. Symmetrical.  Speech is fluent. Motor exam as listed above. Psychiatric: Judgment intact, Mood & affect appropriate for pt's clinical situation. Dermatologic: No rashes or ulcers noted.  No changes consistent with cellulitis. Lymph : No lichenification or skin changes of chronic lymphedema.  CBC Lab Results  Component Value Date   WBC 8.6 01/16/2014   HGB 12.3 (L) 01/16/2014   HCT 36.5 (L) 01/16/2014   MCV 89 01/16/2014   PLT 145 (L) 01/16/2014    BMET    Component Value Date/Time   NA 136 05/02/2018 1257   NA 131 (L) 01/17/2014 0359   K 3.7 05/02/2018 1257   K 3.9 01/17/2014 0359   CL 101 05/02/2018 1257   CL 100 01/17/2014 0359   CO2 27 05/02/2018 1257   CO2 28 01/17/2014 0359   GLUCOSE 210 (H) 05/02/2018 1257   GLUCOSE 245 (H) 01/17/2014 0359   BUN 16 05/02/2018 1257   BUN 13 03/25/2018 1528   BUN 18 01/17/2014 0359   CREATININE 0.80 05/02/2018 1257   CREATININE 0.94 01/17/2014 0359   CALCIUM 9.3 05/02/2018 1257   CALCIUM 8.7 01/17/2014 0359   GFRNONAA >60 05/02/2018 1257   GFRNONAA >60 01/17/2014 0359   GFRAA >60 05/02/2018 1257   GFRAA >60 01/17/2014 0359   CrCl cannot be calculated (Patient's most recent lab result is older than the maximum 21 days allowed.).  COAG Lab Results  Component Value Date   INR 0.9 07/13/2012   INR 0.9 06/04/2012   INR 1.2 02/29/2012    Radiology No results found.   Assessment/Plan 1. AAA (abdominal aortic aneurysm) without rupture (HCC) Recommend: Patient is status post successful endovascular repair of the AAA.   No further intervention is required at this time.  No endoleak is detected and the aneurysm sac  is stable.  The patient will continue antiplatelet therapy as prescribed as well as aggressive management of hyperlipidemia. Exercise is again strongly encouraged.   However, endografts require continued surveillance with ultrasound or CT scan. This is mandatory to detect any changes that allow repressurization of the aneurysm sac. The patient is informed that this would be asymptomatic.  The patient is reminded that lifelong routine surveillance is a necessity with an endograft. Patient will continue to follow-up at 6 month intervals with ultrasound of the aorta. - VAS US AORTA/IVC/ILIACS; Future  2. Popliteal artery aneurysm (HCC) Continue to follow with ultraosund - VAS [place] LOWER EXTREMITY ARTERIAL DUPLEX; Future  3. Essential hypertension Continue antihypertensive medications as already ordered, these medications have been reviewed and there are no changes at this time.   4. Type 2 diabetes mellitus with diabetic polyneuropathy, with long-term current use of insulin (HCC) Continue hypoglycemic medications as already ordered, these medications have been reviewed  and there are no changes at this time.  Hgb A1C to be monitored as already arranged by primary service   5. Mixed hyperlipidemia Continue statin as ordered and reviewed, no changes at this time     Hortencia Pilar, MD  11/24/2020 9:35 AM

## 2020-11-25 DIAGNOSIS — I69354 Hemiplegia and hemiparesis following cerebral infarction affecting left non-dominant side: Secondary | ICD-10-CM | POA: Diagnosis not present

## 2020-11-25 DIAGNOSIS — B351 Tinea unguium: Secondary | ICD-10-CM | POA: Diagnosis not present

## 2020-11-25 DIAGNOSIS — N401 Enlarged prostate with lower urinary tract symptoms: Secondary | ICD-10-CM | POA: Diagnosis not present

## 2020-11-25 DIAGNOSIS — I10 Essential (primary) hypertension: Secondary | ICD-10-CM | POA: Diagnosis not present

## 2020-11-25 DIAGNOSIS — E1159 Type 2 diabetes mellitus with other circulatory complications: Secondary | ICD-10-CM | POA: Diagnosis not present

## 2020-11-25 DIAGNOSIS — F0150 Vascular dementia without behavioral disturbance: Secondary | ICD-10-CM | POA: Diagnosis not present

## 2020-11-25 DIAGNOSIS — I4891 Unspecified atrial fibrillation: Secondary | ICD-10-CM | POA: Diagnosis not present

## 2020-11-25 DIAGNOSIS — K219 Gastro-esophageal reflux disease without esophagitis: Secondary | ICD-10-CM | POA: Diagnosis not present

## 2020-11-25 DIAGNOSIS — F39 Unspecified mood [affective] disorder: Secondary | ICD-10-CM | POA: Diagnosis not present

## 2020-11-25 DIAGNOSIS — I714 Abdominal aortic aneurysm, without rupture: Secondary | ICD-10-CM | POA: Diagnosis not present

## 2021-01-13 DIAGNOSIS — B351 Tinea unguium: Secondary | ICD-10-CM | POA: Diagnosis not present

## 2021-01-30 DIAGNOSIS — F0150 Vascular dementia without behavioral disturbance: Secondary | ICD-10-CM

## 2021-01-30 DIAGNOSIS — E1159 Type 2 diabetes mellitus with other circulatory complications: Secondary | ICD-10-CM | POA: Diagnosis not present

## 2021-01-30 DIAGNOSIS — I719 Aortic aneurysm of unspecified site, without rupture: Secondary | ICD-10-CM

## 2021-01-30 DIAGNOSIS — I480 Paroxysmal atrial fibrillation: Secondary | ICD-10-CM | POA: Diagnosis not present

## 2021-01-30 DIAGNOSIS — G8191 Hemiplegia, unspecified affecting right dominant side: Secondary | ICD-10-CM | POA: Diagnosis not present

## 2021-01-30 DIAGNOSIS — F39 Unspecified mood [affective] disorder: Secondary | ICD-10-CM

## 2021-01-30 DIAGNOSIS — N1831 Chronic kidney disease, stage 3a: Secondary | ICD-10-CM

## 2021-04-05 DIAGNOSIS — N401 Enlarged prostate with lower urinary tract symptoms: Secondary | ICD-10-CM

## 2021-04-05 DIAGNOSIS — M1990 Unspecified osteoarthritis, unspecified site: Secondary | ICD-10-CM

## 2021-04-05 DIAGNOSIS — I714 Abdominal aortic aneurysm, without rupture: Secondary | ICD-10-CM

## 2021-04-05 DIAGNOSIS — F39 Unspecified mood [affective] disorder: Secondary | ICD-10-CM | POA: Diagnosis not present

## 2021-04-05 DIAGNOSIS — K219 Gastro-esophageal reflux disease without esophagitis: Secondary | ICD-10-CM

## 2021-04-05 DIAGNOSIS — I69354 Hemiplegia and hemiparesis following cerebral infarction affecting left non-dominant side: Secondary | ICD-10-CM | POA: Diagnosis not present

## 2021-04-05 DIAGNOSIS — I4891 Unspecified atrial fibrillation: Secondary | ICD-10-CM | POA: Diagnosis not present

## 2021-04-05 DIAGNOSIS — E1159 Type 2 diabetes mellitus with other circulatory complications: Secondary | ICD-10-CM

## 2021-04-05 DIAGNOSIS — N183 Chronic kidney disease, stage 3 unspecified: Secondary | ICD-10-CM

## 2021-04-05 DIAGNOSIS — F0150 Vascular dementia without behavioral disturbance: Secondary | ICD-10-CM

## 2021-04-05 DIAGNOSIS — I10 Essential (primary) hypertension: Secondary | ICD-10-CM

## 2021-05-05 DIAGNOSIS — B351 Tinea unguium: Secondary | ICD-10-CM | POA: Diagnosis not present

## 2021-06-19 DIAGNOSIS — F0150 Vascular dementia without behavioral disturbance: Secondary | ICD-10-CM | POA: Diagnosis not present

## 2021-06-19 DIAGNOSIS — G8191 Hemiplegia, unspecified affecting right dominant side: Secondary | ICD-10-CM | POA: Diagnosis not present

## 2021-06-19 DIAGNOSIS — I10 Essential (primary) hypertension: Secondary | ICD-10-CM

## 2021-06-19 DIAGNOSIS — F22 Delusional disorders: Secondary | ICD-10-CM | POA: Diagnosis not present

## 2021-06-19 DIAGNOSIS — F39 Unspecified mood [affective] disorder: Secondary | ICD-10-CM

## 2021-06-19 DIAGNOSIS — E1159 Type 2 diabetes mellitus with other circulatory complications: Secondary | ICD-10-CM | POA: Diagnosis not present

## 2021-06-19 DIAGNOSIS — I480 Paroxysmal atrial fibrillation: Secondary | ICD-10-CM

## 2021-08-11 DIAGNOSIS — I872 Venous insufficiency (chronic) (peripheral): Secondary | ICD-10-CM

## 2021-08-11 DIAGNOSIS — I10 Essential (primary) hypertension: Secondary | ICD-10-CM | POA: Diagnosis not present

## 2021-08-11 DIAGNOSIS — F39 Unspecified mood [affective] disorder: Secondary | ICD-10-CM | POA: Diagnosis not present

## 2021-08-11 DIAGNOSIS — N183 Chronic kidney disease, stage 3 unspecified: Secondary | ICD-10-CM

## 2021-08-11 DIAGNOSIS — F0150 Vascular dementia without behavioral disturbance: Secondary | ICD-10-CM

## 2021-08-11 DIAGNOSIS — I69351 Hemiplegia and hemiparesis following cerebral infarction affecting right dominant side: Secondary | ICD-10-CM

## 2021-08-11 DIAGNOSIS — N401 Enlarged prostate with lower urinary tract symptoms: Secondary | ICD-10-CM

## 2021-08-11 DIAGNOSIS — K219 Gastro-esophageal reflux disease without esophagitis: Secondary | ICD-10-CM

## 2021-08-11 DIAGNOSIS — E1159 Type 2 diabetes mellitus with other circulatory complications: Secondary | ICD-10-CM

## 2021-08-11 DIAGNOSIS — M1990 Unspecified osteoarthritis, unspecified site: Secondary | ICD-10-CM

## 2021-08-11 DIAGNOSIS — I714 Abdominal aortic aneurysm, without rupture: Secondary | ICD-10-CM

## 2021-08-11 DIAGNOSIS — I4891 Unspecified atrial fibrillation: Secondary | ICD-10-CM | POA: Diagnosis not present

## 2021-08-11 DIAGNOSIS — F22 Delusional disorders: Secondary | ICD-10-CM

## 2021-10-17 DIAGNOSIS — E1159 Type 2 diabetes mellitus with other circulatory complications: Secondary | ICD-10-CM | POA: Diagnosis not present

## 2021-10-17 DIAGNOSIS — F0150 Vascular dementia without behavioral disturbance: Secondary | ICD-10-CM | POA: Diagnosis not present

## 2021-10-17 DIAGNOSIS — G8191 Hemiplegia, unspecified affecting right dominant side: Secondary | ICD-10-CM | POA: Diagnosis not present

## 2021-10-17 DIAGNOSIS — I480 Paroxysmal atrial fibrillation: Secondary | ICD-10-CM | POA: Diagnosis not present

## 2021-11-07 DIAGNOSIS — J181 Lobar pneumonia, unspecified organism: Secondary | ICD-10-CM | POA: Diagnosis not present

## 2021-11-21 NOTE — Progress Notes (Signed)
MRN : [id number]  Benjamin Grant is a 87 y.o. (12/14/1930) male who presents with chief complaint of check stent.  History of Present Illness:   The patient returns to the office for surveillance of an abdominal aortic aneurysm status post stent graft placement.    Patient denies abdominal pain or back pain, no other abdominal complaints. No groin related complaints. No symptoms consistent with distal embolization No changes in claudication distance.    There have been no interval changes in his overall healthcare since his last visit.    Patient denies amaurosis fugax or TIA symptoms. There is no history of claudication or rest pain symptoms of the lower extremities. The patient denies angina or shortness of breath.    Duplex US of the aorta and iliac arteries shows a 2.86 cm AAA sac with no endoleak, decreas in the sac compared to the previous study.  Previous duplex showed a 2.70 cm AAA sac   No outpatient medications have been marked as taking for the 11/23/21 encounter (Appointment) with Delana Meyer, Dolores Lory, MD.   Current Facility-Administered Medications for the 11/23/21 encounter (Appointment) with Delana Meyer, Dolores Lory, MD  Medication   lidocaine (XYLOCAINE) 2 % jelly 1 application    Past Medical History:  Diagnosis Date   AAA (abdominal aortic aneurysm) ([place])    Aneurysm of artery ([place])    popliteal   Atrial fibrillation ([place])    Dementia ([place])    Depression    Diabetes mellitus without complication ([place])    Diverticulosis large intestine w/o perforation or abscess w/bleeding    GERD without esophagitis    Gross hematuria    Hyperlipidemia    Hypertension    Ventricular tachycardia ([place])     Past Surgical History:  Procedure Laterality Date   TRANSURETHRAL RESECTION OF BLADDER TUMOR WITH MITOMYCIN-C N/A 05/09/2018   Procedure: TRANSURETHRAL RESECTION OF BLADDER TUMOR WITH Gemcitabine;  Surgeon: Abbie Sons, MD;  Location: [facility] ORS;  Service: Urology;   Laterality: N/A;    Social History Social History   Tobacco Use   Smoking status: Former   Smokeless tobacco: Never  Scientific laboratory technician Use: Never used  Substance Use Topics   Alcohol use: Not Currently   Drug use: Never    Family History No family history on file.  Allergies  Allergen Reactions   Penicillins     Per MAR     REVIEW OF SYSTEMS (Negative unless checked)  Constitutional: [] Weight loss  [] Fever  [] Chills Cardiac: [] Chest pain   [] Chest pressure   [] Palpitations   [] Shortness of breath when laying flat   [] Shortness of breath with exertion. Vascular:  [] Pain in legs with walking   [] Pain in legs at rest  [] History of DVT   [] Phlebitis   [] Swelling in legs   [] Varicose veins   [] Non-healing ulcers Pulmonary:   [] Uses home oxygen   [] Productive cough   [] Hemoptysis   [] Wheeze  [] COPD   [] Asthma Neurologic:  [] Dizziness   [] Seizures   [] History of stroke   [] History of TIA  [] Aphasia   [] Vissual changes   [] Weakness or numbness in arm   [] Weakness or numbness in leg Musculoskeletal:   [] Joint swelling   [] Joint pain   [] Low back pain Hematologic:  [] Easy bruising  [] Easy bleeding   [] Hypercoagulable state   [] Anemic Gastrointestinal:  [] Diarrhea   [] Vomiting  [] Gastroesophageal reflux/heartburn   [] Difficulty swallowing. Genitourinary:  [] Chronic kidney disease   [] Difficult urination  [] Frequent urination   []   Blood in urine Skin:  [] Rashes   [] Ulcers  Psychological:  [] History of anxiety   []  History of major depression.  Physical Examination  There were no vitals filed for this visit. There is no height or weight on file to calculate BMI. Gen: WD/WN, NAD seen in a wheelchair Head: [place]/AT, No temporalis wasting.  Ear/Nose/Throat: Hearing grossly intact, nares w/o erythema or drainage Eyes: PER, EOMI, sclera nonicteric.  Neck: Supple, no masses.  No bruit or JVD.  Pulmonary:  Good air movement, no audible wheezing, no use of accessory muscles.  Cardiac: RRR,  normal S1, S2, no Murmurs. Vascular:   Vessel Right Left  Radial Palpable Palpable  Gastrointestinal: soft, non-distended. No guarding/no peritoneal signs.  Musculoskeletal: M/S 5/5 throughout.  No visible deformity.  Neurologic: CN 2-12 intact. Pain and light touch intact in extremities.  Symmetrical.  Speech is fluent. Motor exam as listed above. Psychiatric: Judgment intact, Mood & affect appropriate for pt's clinical situation. Dermatologic: No rashes or ulcers noted.  No changes consistent with cellulitis.   CBC Lab Results  Component Value Date   WBC 8.6 01/16/2014   HGB 12.3 (L) 01/16/2014   HCT 36.5 (L) 01/16/2014   MCV 89 01/16/2014   PLT 145 (L) 01/16/2014    BMET    Component Value Date/Time   NA 136 05/02/2018 1257   NA 131 (L) 01/17/2014 0359   K 3.7 05/02/2018 1257   K 3.9 01/17/2014 0359   CL 101 05/02/2018 1257   CL 100 01/17/2014 0359   CO2 27 05/02/2018 1257   CO2 28 01/17/2014 0359   GLUCOSE 210 (H) 05/02/2018 1257   GLUCOSE 245 (H) 01/17/2014 0359   BUN 16 05/02/2018 1257   BUN 13 03/25/2018 1528   BUN 18 01/17/2014 0359   CREATININE 0.80 05/02/2018 1257   CREATININE 0.94 01/17/2014 0359   CALCIUM 9.3 05/02/2018 1257   CALCIUM 8.7 01/17/2014 0359   GFRNONAA >60 05/02/2018 1257   GFRNONAA >60 01/17/2014 0359   GFRAA >60 05/02/2018 1257   GFRAA >60 01/17/2014 0359   CrCl cannot be calculated (Patient's most recent lab result is older than the maximum 21 days allowed.).  COAG Lab Results  Component Value Date   INR 0.9 07/13/2012   INR 0.9 06/04/2012   INR 1.2 02/29/2012    Radiology No results found.   Assessment/Plan 1. Infrarenal abdominal aortic aneurysm (AAA) without rupture Recommend:   Patient is status post successful endovascular repair of the AAA.    No further intervention is required at this time.   No endoleak is detected and the aneurysm sac is stable.   The patient will continue antiplatelet therapy as prescribed as  well as aggressive management of hyperlipidemia. Exercise is again strongly encouraged.    However, endografts require continued surveillance with ultrasound or CT scan. This is mandatory to detect any changes that allow repressurization of the aneurysm sac.  The patient is informed that this would be asymptomatic.   The patient is reminded that lifelong routine surveillance is a necessity with an endograft. Patient will continue to follow-up at 12 month intervals with ultrasound of the aorta. - VAS US AORTA/IVC/ILIACS; Future  2. Popliteal artery aneurysm (HCC) Duplex ultrasound today shows stable size no intervention indicated  3. Essential hypertension Continue antihypertensive medications as already ordered, these medications have been reviewed and there are no changes at this time.   4. Type 2 diabetes mellitus with diabetic polyneuropathy, with long-term current use of insulin ([place])  Continue hypoglycemic medications as already ordered, these medications have been reviewed and there are no changes at this time.  Hgb A1C to be monitored as already arranged by primary service   5. Mixed hyperlipidemia Continue statin as ordered and reviewed, no changes at this time     Hortencia Pilar, MD  11/21/2021 6:51 PM

## 2021-11-23 ENCOUNTER — Ambulatory Visit (INDEPENDENT_AMBULATORY_CARE_PROVIDER_SITE_OTHER): Payer: Medicare Other

## 2021-11-23 ENCOUNTER — Ambulatory Visit (INDEPENDENT_AMBULATORY_CARE_PROVIDER_SITE_OTHER): Payer: Medicare Other | Admitting: Vascular Surgery

## 2021-11-23 ENCOUNTER — Other Ambulatory Visit: Payer: Self-pay

## 2021-11-23 VITALS — BP 142/87 | HR 78 | Ht 72.0 in | Wt 180.0 lb

## 2021-11-23 DIAGNOSIS — E1142 Type 2 diabetes mellitus with diabetic polyneuropathy: Secondary | ICD-10-CM

## 2021-11-23 DIAGNOSIS — I724 Aneurysm of artery of lower extremity: Secondary | ICD-10-CM

## 2021-11-23 DIAGNOSIS — E782 Mixed hyperlipidemia: Secondary | ICD-10-CM

## 2021-11-23 DIAGNOSIS — Z794 Long term (current) use of insulin: Secondary | ICD-10-CM

## 2021-11-23 DIAGNOSIS — I714 Abdominal aortic aneurysm, without rupture, unspecified: Secondary | ICD-10-CM

## 2021-11-23 DIAGNOSIS — I7143 Infrarenal abdominal aortic aneurysm, without rupture: Secondary | ICD-10-CM

## 2021-11-23 DIAGNOSIS — I10 Essential (primary) hypertension: Secondary | ICD-10-CM | POA: Diagnosis not present

## 2021-12-06 DIAGNOSIS — I69351 Hemiplegia and hemiparesis following cerebral infarction affecting right dominant side: Secondary | ICD-10-CM

## 2021-12-06 DIAGNOSIS — N401 Enlarged prostate with lower urinary tract symptoms: Secondary | ICD-10-CM

## 2021-12-06 DIAGNOSIS — I872 Venous insufficiency (chronic) (peripheral): Secondary | ICD-10-CM

## 2021-12-06 DIAGNOSIS — F22 Delusional disorders: Secondary | ICD-10-CM | POA: Diagnosis not present

## 2021-12-06 DIAGNOSIS — I714 Abdominal aortic aneurysm, without rupture, unspecified: Secondary | ICD-10-CM | POA: Diagnosis not present

## 2021-12-06 DIAGNOSIS — N183 Chronic kidney disease, stage 3 unspecified: Secondary | ICD-10-CM

## 2021-12-06 DIAGNOSIS — I4892 Unspecified atrial flutter: Secondary | ICD-10-CM

## 2021-12-06 DIAGNOSIS — K219 Gastro-esophageal reflux disease without esophagitis: Secondary | ICD-10-CM

## 2021-12-06 DIAGNOSIS — I10 Essential (primary) hypertension: Secondary | ICD-10-CM

## 2021-12-06 DIAGNOSIS — M1990 Unspecified osteoarthritis, unspecified site: Secondary | ICD-10-CM

## 2021-12-06 DIAGNOSIS — E1159 Type 2 diabetes mellitus with other circulatory complications: Secondary | ICD-10-CM | POA: Diagnosis not present

## 2021-12-06 DIAGNOSIS — F0150 Vascular dementia without behavioral disturbance: Secondary | ICD-10-CM

## 2021-12-06 DIAGNOSIS — F39 Unspecified mood [affective] disorder: Secondary | ICD-10-CM

## 2021-12-10 ENCOUNTER — Other Ambulatory Visit: Payer: Self-pay

## 2021-12-10 ENCOUNTER — Emergency Department (HOSPITAL_COMMUNITY): Payer: Medicare Other

## 2021-12-10 ENCOUNTER — Inpatient Hospital Stay (HOSPITAL_COMMUNITY): Payer: Medicare Other

## 2021-12-10 ENCOUNTER — Inpatient Hospital Stay (HOSPITAL_COMMUNITY)
Admission: EM | Admit: 2021-12-10 | Discharge: 2021-12-12 | DRG: 065 | Disposition: A | Discharge status: Skilled Nursing Facility | Payer: Medicare Other | Source: Skilled Nursing Facility | Attending: Internal Medicine | Admitting: Internal Medicine

## 2021-12-10 DIAGNOSIS — R29717 NIHSS score 17: Secondary | ICD-10-CM | POA: Diagnosis present

## 2021-12-10 DIAGNOSIS — N400 Enlarged prostate without lower urinary tract symptoms: Secondary | ICD-10-CM | POA: Diagnosis present

## 2021-12-10 DIAGNOSIS — E119 Type 2 diabetes mellitus without complications: Secondary | ICD-10-CM | POA: Diagnosis present

## 2021-12-10 DIAGNOSIS — Z87891 Personal history of nicotine dependence: Secondary | ICD-10-CM | POA: Diagnosis not present

## 2021-12-10 DIAGNOSIS — E785 Hyperlipidemia, unspecified: Secondary | ICD-10-CM | POA: Diagnosis present

## 2021-12-10 DIAGNOSIS — I63431 Cerebral infarction due to embolism of right posterior cerebral artery: Secondary | ICD-10-CM | POA: Diagnosis present

## 2021-12-10 DIAGNOSIS — Z515 Encounter for palliative care: Secondary | ICD-10-CM | POA: Diagnosis not present

## 2021-12-10 DIAGNOSIS — Z20822 Contact with and (suspected) exposure to covid-19: Secondary | ICD-10-CM | POA: Diagnosis present

## 2021-12-10 DIAGNOSIS — Z66 Do not resuscitate: Secondary | ICD-10-CM | POA: Diagnosis present

## 2021-12-10 DIAGNOSIS — H518 Other specified disorders of binocular movement: Secondary | ICD-10-CM | POA: Diagnosis present

## 2021-12-10 DIAGNOSIS — Z7982 Long term (current) use of aspirin: Secondary | ICD-10-CM | POA: Diagnosis not present

## 2021-12-10 DIAGNOSIS — Z7984 Long term (current) use of oral hypoglycemic drugs: Secondary | ICD-10-CM

## 2021-12-10 DIAGNOSIS — I639 Cerebral infarction, unspecified: Secondary | ICD-10-CM | POA: Diagnosis present

## 2021-12-10 DIAGNOSIS — K219 Gastro-esophageal reflux disease without esophagitis: Secondary | ICD-10-CM | POA: Diagnosis present

## 2021-12-10 DIAGNOSIS — F32A Depression, unspecified: Secondary | ICD-10-CM | POA: Diagnosis present

## 2021-12-10 DIAGNOSIS — Z794 Long term (current) use of insulin: Secondary | ICD-10-CM | POA: Diagnosis not present

## 2021-12-10 DIAGNOSIS — I480 Paroxysmal atrial fibrillation: Secondary | ICD-10-CM | POA: Diagnosis present

## 2021-12-10 DIAGNOSIS — G8194 Hemiplegia, unspecified affecting left nondominant side: Secondary | ICD-10-CM | POA: Diagnosis present

## 2021-12-10 DIAGNOSIS — M795 Residual foreign body in soft tissue: Secondary | ICD-10-CM

## 2021-12-10 DIAGNOSIS — R471 Dysarthria and anarthria: Secondary | ICD-10-CM | POA: Diagnosis present

## 2021-12-10 DIAGNOSIS — I10 Essential (primary) hypertension: Secondary | ICD-10-CM | POA: Diagnosis present

## 2021-12-10 DIAGNOSIS — E1159 Type 2 diabetes mellitus with other circulatory complications: Secondary | ICD-10-CM | POA: Diagnosis not present

## 2021-12-10 DIAGNOSIS — Z7901 Long term (current) use of anticoagulants: Secondary | ICD-10-CM | POA: Diagnosis not present

## 2021-12-10 DIAGNOSIS — Z79899 Other long term (current) drug therapy: Secondary | ICD-10-CM | POA: Diagnosis not present

## 2021-12-10 DIAGNOSIS — R29810 Facial weakness: Secondary | ICD-10-CM | POA: Diagnosis present

## 2021-12-10 DIAGNOSIS — F0390 Unspecified dementia without behavioral disturbance: Secondary | ICD-10-CM | POA: Diagnosis present

## 2021-12-10 DIAGNOSIS — Z8679 Personal history of other diseases of the circulatory system: Secondary | ICD-10-CM

## 2021-12-10 DIAGNOSIS — I6389 Other cerebral infarction: Secondary | ICD-10-CM | POA: Diagnosis not present

## 2021-12-10 LAB — COMPREHENSIVE METABOLIC PANEL
ALT: 22 U/L (ref 0–44)
AST: 19 U/L (ref 15–41)
Albumin: 3.5 g/dL (ref 3.5–5.0)
Alkaline Phosphatase: 68 U/L (ref 38–126)
Anion gap: 15 (ref 5–15)
BUN: 21 mg/dL (ref 8–23)
CO2: 18 mmol/L — ABNORMAL LOW (ref 22–32)
Calcium: 9.3 mg/dL (ref 8.9–10.3)
Chloride: 107 mmol/L (ref 98–111)
Creatinine, Ser: 1.06 mg/dL (ref 0.61–1.24)
GFR, Estimated: >60 mL/min (ref >60)
Glucose, Bld: 201 mg/dL — ABNORMAL HIGH (ref 70–99)
Potassium: 4 mmol/L (ref 3.5–5.1)
Sodium: 140 mmol/L (ref 135–145)
Total Bilirubin: 0.8 mg/dL (ref 0.3–1.2)
Total Protein: 6.5 g/dL (ref 6.5–8.1)

## 2021-12-10 LAB — DIFFERENTIAL
Abs Immature Granulocytes: 0.05 10*3/uL (ref 0.00–0.07)
Basophils Absolute: 0.1 10*3/uL (ref 0.0–0.1)
Basophils Relative: 0 %
Eosinophils Absolute: 0 10*3/uL (ref 0.0–0.5)
Eosinophils Relative: 0 %
Immature Granulocytes: 0 %
Lymphocytes Relative: 10 %
Lymphs Abs: 1.4 10*3/uL (ref 0.7–4.0)
Monocytes Absolute: 1.2 10*3/uL — ABNORMAL HIGH (ref 0.1–1.0)
Monocytes Relative: 9 %
Neutro Abs: 10.6 10*3/uL — ABNORMAL HIGH (ref 1.7–7.7)
Neutrophils Relative %: 81 %

## 2021-12-10 LAB — CBC
HCT: 55.2 % — ABNORMAL HIGH (ref 39.0–52.0)
Hemoglobin: 17.1 g/dL — ABNORMAL HIGH (ref 13.0–17.0)
MCH: 33.1 pg (ref 26.0–34.0)
MCHC: 31 g/dL (ref 30.0–36.0)
MCV: 107 fL — ABNORMAL HIGH (ref 80.0–100.0)
Platelets: 171 10*3/uL (ref 150–400)
RBC: 5.16 MIL/uL (ref 4.22–5.81)
RDW: 13.6 % (ref 11.5–15.5)
WBC: 13.3 10*3/uL — ABNORMAL HIGH (ref 4.0–10.5)
nRBC: 0 % (ref 0.0–0.2)

## 2021-12-10 LAB — PROTIME-INR
INR: 1.1 (ref 0.8–1.2)
Prothrombin Time: 14.3 seconds (ref 11.4–15.2)

## 2021-12-10 LAB — APTT: aPTT: 30 seconds (ref 24–36)

## 2021-12-10 LAB — I-STAT CHEM 8, ED
BUN: 26 mg/dL — ABNORMAL HIGH (ref 8–23)
Calcium, Ion: 1.01 mmol/L — ABNORMAL LOW (ref 1.15–1.40)
Chloride: 107 mmol/L (ref 98–111)
Creatinine, Ser: 0.9 mg/dL (ref 0.61–1.24)
Glucose, Bld: 203 mg/dL — ABNORMAL HIGH (ref 70–99)
HCT: 51 % (ref 39.0–52.0)
Hemoglobin: 17.3 g/dL — ABNORMAL HIGH (ref 13.0–17.0)
Potassium: 3.9 mmol/L (ref 3.5–5.1)
Sodium: 141 mmol/L (ref 135–145)
TCO2: 26 mmol/L (ref 22–32)

## 2021-12-10 LAB — CBG MONITORING, ED
Glucose-Capillary: 201 mg/dL — ABNORMAL HIGH (ref 70–99)
Glucose-Capillary: 151 mg/dL — ABNORMAL HIGH (ref 70–99)
Glucose-Capillary: 105 mg/dL — ABNORMAL HIGH (ref 70–99)

## 2021-12-10 LAB — RESP PANEL BY RT-PCR (FLU A&B, COVID) ARPGX2
Influenza A by PCR: NEGATIVE
Influenza B by PCR: NEGATIVE
SARS Coronavirus 2 by RT PCR: NEGATIVE

## 2021-12-10 MED ORDER — ACETAMINOPHEN 650 MG RE SUPP: 650.0000 mg | RECTAL | Status: DC | PRN

## 2021-12-10 MED ORDER — STROKE: EARLY STAGES OF RECOVERY BOOK
Freq: Once | Status: AC
  Filled 2021-12-10: qty 1

## 2021-12-10 MED ORDER — SODIUM CHLORIDE 0.9 % IV SOLN: INTRAVENOUS | Status: DC

## 2021-12-10 MED ORDER — ENOXAPARIN SODIUM 40 MG/0.4ML IJ SOSY
40.0000 mg | PREFILLED_SYRINGE | INTRAMUSCULAR | Status: DC
  Administered 2021-12-10: 14:00:00 40 mg via SUBCUTANEOUS
  Filled 2021-12-10: qty 0.4

## 2021-12-10 MED ORDER — INSULIN ASPART 100 UNIT/ML IJ SOLN: 0.0000 [IU] | Freq: Every day | INTRAMUSCULAR | Status: DC

## 2021-12-10 MED ORDER — DONEPEZIL HCL 10 MG PO TABS
10.0000 mg | ORAL_TABLET | Freq: Every day | ORAL | Status: DC
  Filled 2021-12-10: qty 1

## 2021-12-10 MED ORDER — DIGOXIN 125 MCG PO TABS: 0.1250 mg | ORAL_TABLET | Freq: Every morning | ORAL | Status: DC

## 2021-12-10 MED ORDER — DILTIAZEM HCL 25 MG/5ML IV SOLN
5.0000 mg | INTRAVENOUS | Status: DC | PRN
  Administered 2021-12-10: 19:00:00 5 mg via INTRAVENOUS
  Filled 2021-12-10: qty 5

## 2021-12-10 MED ORDER — ASPIRIN 325 MG PO TABS
325.0000 mg | ORAL_TABLET | Freq: Every day | ORAL | Status: DC
  Filled 2021-12-10: qty 1

## 2021-12-10 MED ORDER — CITALOPRAM HYDROBROMIDE 10 MG PO TABS: 10.0000 mg | ORAL_TABLET | Freq: Every day | ORAL | Status: DC

## 2021-12-10 MED ORDER — MEMANTINE HCL 10 MG PO TABS
10.0000 mg | ORAL_TABLET | Freq: Two times a day (BID) | ORAL | Status: DC
  Filled 2021-12-10 (×2): qty 1

## 2021-12-10 MED ORDER — ASPIRIN 300 MG RE SUPP
300.0000 mg | Freq: Every day | RECTAL | Status: DC
  Administered 2021-12-10: 14:00:00 300 mg via RECTAL
  Filled 2021-12-10: qty 1

## 2021-12-10 MED ORDER — ACETAMINOPHEN 325 MG PO TABS: 650.0000 mg | ORAL_TABLET | ORAL | Status: DC | PRN

## 2021-12-10 MED ORDER — INSULIN ASPART 100 UNIT/ML IJ SOLN: 0.0000 [IU] | INTRAMUSCULAR | Status: DC

## 2021-12-10 MED ORDER — FINASTERIDE 5 MG PO TABS: 5.0000 mg | ORAL_TABLET | Freq: Every morning | ORAL | Status: DC

## 2021-12-10 MED ORDER — SODIUM CHLORIDE 0.9% FLUSH
3.0000 mL | Freq: Once | INTRAVENOUS | Status: AC
Start: 2021-12-10 — End: 2021-12-10
  Administered 2021-12-10: 10:00:00 3 mL via INTRAVENOUS

## 2021-12-10 MED ORDER — SENNOSIDES-DOCUSATE SODIUM 8.6-50 MG PO TABS: 1.0000 | ORAL_TABLET | Freq: Every evening | ORAL | Status: DC | PRN

## 2021-12-10 MED ORDER — INSULIN ASPART 100 UNIT/ML IJ SOLN
0.0000 [IU] | Freq: Three times a day (TID) | INTRAMUSCULAR | Status: DC
  Administered 2021-12-10: 14:00:00 3 [IU] via SUBCUTANEOUS

## 2021-12-10 MED ORDER — IOHEXOL 350 MG/ML SOLN
75.0000 mL | Freq: Once | INTRAVENOUS | Status: AC | PRN
  Administered 2021-12-10: 10:00:00 75 mL via INTRAVENOUS

## 2021-12-10 MED ORDER — INSULIN GLARGINE-YFGN 100 UNIT/ML ~~LOC~~ SOLN
34.0000 [IU] | Freq: Every day | SUBCUTANEOUS | Status: DC
  Filled 2021-12-10: qty 0.34

## 2021-12-10 MED ORDER — TAMSULOSIN HCL 0.4 MG PO CAPS: 0.4000 mg | ORAL_CAPSULE | Freq: Every morning | ORAL | Status: DC

## 2021-12-10 MED ORDER — ATORVASTATIN CALCIUM 40 MG PO TABS
40.0000 mg | ORAL_TABLET | Freq: Every day | ORAL | Status: DC
  Filled 2021-12-10: qty 1

## 2021-12-10 MED ORDER — ACETAMINOPHEN 160 MG/5ML PO SOLN: 650.0000 mg | ORAL | Status: DC | PRN

## 2021-12-10 NOTE — ED Notes (Addendum)
Pt HR remains elevated between 115-130 in a fib. Administered PRN diltiazem per order

## 2021-12-10 NOTE — Consult Note (Signed)
Neurology Consultation  Reason for Consult: Code Stroke Referring Physician: Dr. Laverta Baltimore  CC: Right gaze, left-sided neglect  History is obtained from: Chart review, Patient's friend Dorise Bullion via telephone  HPI: Benjamin Grant is a 87 y.o. male with a medical history significant for abdominal aortic aneurysm s/p stent graft, atrial fibrillation on Eliquis, dementia, depression, type 2 diabetes mellitus, hyperlipidemia, essential hypertension, former tobacco use, and a popliteal artery aneurysm who presented to the ED from his memory care unit at [facility] 12/18 via EMS for evaluation of rightward gaze, rightward head turn, and left-sided neglect.  Patient was last known well at his facility at 20:00 on 12/17 and night shift noted in handoff report this morning that patient had a status change.  On evaluation at 07:00 patient was evaluated to have right-sided gaze with a rightward head turn and left-sided weakness and EMS was later activated for further evaluation.   At baseline, patient is reportedly pleasantly confused and may or may not recognize family and friends.  He walks with a walker but does need help with most of his ADLs including bathing and dressing.  Versus baseline history is provided by his friend, Dorise Bullion who states that he visits him every 2 to 3 weeks and is unsure patient's complete baseline needs but believes that he is able to feed himself without difficulty.  LKW: 12/17 20:00 TNK given?: no, patient presented outside of the thrombolytic therapy IR Thrombectomy? No, patient's mRs is a 4.  Modified Rankin Scale: 4-Needs assistance to walk and tend to bodily needs  ROS: Unable to obtain due to altered mental status.   Past Medical History:  Diagnosis Date   AAA (abdominal aortic aneurysm) ([place])    Aneurysm of artery (HCC)    popliteal   Atrial fibrillation (HCC)    Dementia (HCC)    Depression    Diabetes mellitus without complication ([place])     Diverticulosis large intestine w/o perforation or abscess w/bleeding    GERD without esophagitis    Gross hematuria    Hyperlipidemia    Hypertension    Ventricular tachycardia ([place])    Past Surgical History:  Procedure Laterality Date   TRANSURETHRAL RESECTION OF BLADDER TUMOR WITH MITOMYCIN-C N/A 05/09/2018   Procedure: TRANSURETHRAL RESECTION OF BLADDER TUMOR WITH Gemcitabine;  Surgeon: Abbie Sons, MD;  Location: [facility] ORS;  Service: Urology;  Laterality: N/A;   No family history on file.  Social History:   reports that he has quit smoking. He has never used smokeless tobacco. He reports that he does not currently use alcohol. He reports that he does not use drugs.  Medications  Current Facility-Administered Medications:    lidocaine (XYLOCAINE) 2 % jelly 1 application, 1 application, Urethral, Once, Stoioff, Scott C, MD   sodium chloride flush (NS) 0.9 % injection 3 mL, 3 mL, Intravenous, Once, Long, Wonda Olds, MD  Current Outpatient Medications:    acetaminophen (TYLENOL) 500 MG tablet, Take 1,000 mg by mouth every 4 (four) hours as needed for moderate pain or fever. , Disp: , Rfl:    aspirin EC 81 MG tablet, Take 81 mg by mouth every morning. , Disp: , Rfl:    citalopram (CELEXA) 10 MG tablet, , Disp: , Rfl:    clindamycin (CLEOCIN) 150 MG capsule, Take 600 mg by mouth See admin instructions. Take 600 mg by mouth 1 hour prior to dental appointment on 07/03/2018, Disp: , Rfl:    clindamycin (CLEOCIN) 300 MG capsule, Take 300  mg by mouth 2 (two) times daily., Disp: , Rfl:    digoxin (LANOXIN) 0.125 MG tablet, Take 0.125 mg by mouth every morning. , Disp: , Rfl:    diltiazem (CARDIZEM CD) 180 MG 24 hr capsule, , Disp: , Rfl:    diltiazem (TIAZAC) 180 MG 24 hr capsule, Take 180 mg by mouth every morning. , Disp: , Rfl:    donepezil (ARICEPT) 10 MG tablet, Take 10 mg by mouth at bedtime., Disp: , Rfl:    ELIQUIS 2.5 MG TABS tablet, , Disp: , Rfl:    famotidine (PEPCID) 20 MG  tablet, Take 20 mg by mouth 2 (two) times daily., Disp: , Rfl:    FARXIGA 10 MG TABS tablet, Take 10 mg by mouth daily., Disp: , Rfl:    finasteride (PROSCAR) 5 MG tablet, Take 5 mg by mouth every morning. , Disp: , Rfl:    furosemide (LASIX) 40 MG tablet, Take 20 mg by mouth daily., Disp: , Rfl:    glipiZIDE (GLUCOTROL) 10 MG tablet, Take 10 mg by mouth 2 (two) times daily., Disp: , Rfl:    GOODSENSE GAS RELIEF 125 MG chewable tablet, , Disp: , Rfl:    guaifenesin (ROBITUSSIN) 100 MG/5ML syrup, Take 200 mg by mouth every 4 (four) hours as needed for cough., Disp: , Rfl:    hydrocortisone (ANUSOL-HC) 2.5 % rectal cream, Place 1 application rectally 2 (two) times daily as needed for hemorrhoids. , Disp: , Rfl:    insulin glargine (LANTUS) 100 UNIT/ML injection, Inject 34 Units into the skin at bedtime. , Disp: , Rfl:    ketoconazole (NIZORAL) 2 % shampoo, Apply 1 application topically See admin instructions. Apply to face, neck, and scalp twice weekly. Let sit for several minutes and rinse, Disp: , Rfl:    lisinopril (PRINIVIL,ZESTRIL) 10 MG tablet, Take 10 mg by mouth every morning. , Disp: , Rfl:    memantine (NAMENDA) 10 MG tablet, Take 10 mg by mouth 2 (two) times daily., Disp: , Rfl:    metoprolol tartrate (LOPRESSOR) 25 MG tablet, Take 12.5 mg by mouth every 12 (twelve) hours. , Disp: , Rfl:    Multiple Vitamin (THEREMS PO), Take 1 tablet by mouth every morning. , Disp: , Rfl:    NYAMYC powder, Apply topically., Disp: , Rfl:    simvastatin (ZOCOR) 10 MG tablet, Take 10 mg by mouth every evening. , Disp: , Rfl:    simvastatin (ZOCOR) 20 MG tablet, Take 20 mg by mouth at bedtime., Disp: , Rfl:    sitaGLIPtin (JANUVIA) 100 MG tablet, Take 100 mg by mouth every morning. , Disp: , Rfl:    sodium chloride (OCEAN) 0.65 % SOLN nasal spray, Place 1 spray into both nostrils See admin instructions. Place 1 spray in each nostril 4 times daily. May use 1 spray in each nostril every 2 hours as needed for  dryness. May leave in room per [facility]., Disp: , Rfl:    sulfamethoxazole-trimethoprim (BACTRIM DS,SEPTRA DS) 800-160 MG tablet, Take 1 tablet by mouth every 12 (twelve) hours., Disp: 14 tablet, Rfl: 0   tamsulosin (FLOMAX) 0.4 MG CAPS capsule, Take 0.4 mg by mouth every morning. , Disp: , Rfl:    triamcinolone cream (KENALOG) 0.1 %, Apply 1 application topically 3 (three) times daily as needed (rash/itching)., Disp: , Rfl:   Exam: Current vital signs: BP (!) 134/92 (BP Location: Right Arm)    Pulse (!) 109    Temp 97.7 F (36.5 C) (Oral)  Resp 14    Ht 6' (1.829 m)    Wt 82 kg    SpO2 97%    BMI 24.52 kg/m  Vital signs in last 24 hours:   GENERAL: Drowsy, wakes to voice, in no acute distress Psych: Patient is confused as per baseline and is intermittently cooperative with exam. Patient does become slightly agitated throughout assessment. Head: Normocephalic and atraumatic, without obvious abnormality EENT: Normal conjunctivae, dry mucous membranes, no OP obstruction LUNGS: Normal respiratory effort. Non-labored breathing on room air. SpO2 97% on telemetry CV: Irregular rate and rhythm, atrial fibrillation on monitor ABDOMEN: Rounded, non-tender, non-rigid  Extremities: Warm, well perfused, without obvious deformity  NEURO:  Mental Status: Drowsy, wakes to voice. He is oriented to self only.  He states that the month is "about 64" and that he is about 87 years old. When asked his current location he states "well, I can't tell you that". He does not answer further orientation questions. He is unable to provide a clear and coherent history of present illness with dementia at baseline. Speech/Language: speech is dysarthric.   He is able to name objects and repeat phrases without difficulty. Patient follows simple commands intermittently. Left-sided neglect is present. Cranial Nerves:  II: PERRL 3 mm/brisk.  III, IV, VI: Patient has rightward forced gaze that is unable to be overcome. V:  Patient does not blink to threat on the left. VII: Patient has left facial droop. VIII: Hearing is intact to loud voice, he is hard of hearing IX, X: Unable to evaluate patient's soft palate but phonation is intact XI: Shoulder shrug is symmetric, head is turned rightward XII: Tongue protrudes midline without fasciculations.   Motor: Right upper and lower extremities elevate antigravity without vertical drift though patient does require constant coaching for participation.  He is able to lift the left upper extremity antigravity with vertical drift to bed.  Left lower extremity has minimal spontaneous movement without antigravity movement. Tone is normal. Bulk is normal.  Sensation: Patient grimaces and yells with application of noxious stimuli throughout Coordination: Does not perform DTRs: 2+ and symmetric patellae, biceps, and brachioradialis  Gait: Deferred for patient safety   NIHSS: 1a Level of Conscious.: 0 1b LOC Questions: 2 1c LOC Commands: 1 2 Best Gaze: 2 3 Visual: 2 4 Facial Palsy: 2 5a Motor Arm - left: 2 5b Motor Arm - Right: 0 6a Motor Leg - Left: 3 6b Motor Leg - Right: 0 7 Limb Ataxia: 0 8 Sensory: 0 9 Best Language: 0 10 Dysarthria: 1 11 Extinct. and Inatten.: 2 TOTAL: 17  Labs I have reviewed labs in epic and the results pertinent to this consultation are: CBC    Component Value Date/Time   WBC 13.3 (H) 12/10/2021 0917   RBC 5.16 12/10/2021 0917   HGB 17.3 (H) 12/10/2021 0923   HGB 12.3 (L) 01/16/2014 0505   HCT 51.0 12/10/2021 0923   HCT 36.5 (L) 01/16/2014 0505   PLT 171 12/10/2021 0917   PLT 145 (L) 01/16/2014 0505   MCV 107.0 (H) 12/10/2021 0917   MCV 89 01/16/2014 0505   MCH 33.1 12/10/2021 0917   MCHC 31.0 12/10/2021 0917   RDW 13.6 12/10/2021 0917   RDW 14.9 (H) 01/16/2014 0505   LYMPHSABS 1.4 12/10/2021 0917   LYMPHSABS 0.6 (L) 01/16/2014 0505   MONOABS 1.2 (H) 12/10/2021 0917   MONOABS 0.6 01/16/2014 0505   EOSABS 0.0 12/10/2021  0917   EOSABS 0.0 01/16/2014 0505   BASOSABS 0.1  12/10/2021 0917   BASOSABS 0.0 01/16/2014 0505    CMP     Component Value Date/Time   NA 141 12/10/2021 0923   NA 131 (L) 01/17/2014 0359   K 3.9 12/10/2021 0923   K 3.9 01/17/2014 0359   CL 107 12/10/2021 0923   CL 100 01/17/2014 0359   CO2 27 05/02/2018 1257   CO2 28 01/17/2014 0359   GLUCOSE 203 (H) 12/10/2021 0923   GLUCOSE 245 (H) 01/17/2014 0359   BUN 26 (H) 12/10/2021 0923   BUN 13 03/25/2018 1528   BUN 18 01/17/2014 0359   CREATININE 0.90 12/10/2021 0923   CREATININE 0.94 01/17/2014 0359   CALCIUM 9.3 05/02/2018 1257   CALCIUM 8.7 01/17/2014 0359   PROT 5.8 (L) 11/27/2013 0437   ALBUMIN 3.1 (L) 11/27/2013 0437   AST 20 11/27/2013 0437   ALT 19 11/27/2013 0437   ALKPHOS 54 11/27/2013 0437   BILITOT 0.6 11/27/2013 0437   GFRNONAA >60 05/02/2018 1257   GFRNONAA >60 01/17/2014 0359   GFRAA >60 05/02/2018 1257   GFRAA >60 01/17/2014 0359   Lipid Panel  No results found for: CHOL, TRIG, HDL, CHOLHDL, VLDL, LDLCALC, LDLDIRECT Lab Results  Component Value Date   HGBA1C 6.8 (H) 03/01/2012   Imaging I have reviewed the images obtained:  CT-scan of the brain 12/18: 1. Acute right PCA distribution infarct at the level of the occipital and temporal lobes. No acute hemorrhage. 2. Advanced brain atrophy. 3. Vertebrobasilar dolichoectasia.  CTA head/neck12/18 IMPRESSION: 1. Short segment occlusion of the right PCA correlating with the acute infarct. 2. Severe tandem right MCA stenoses. 3. Vertebrobasilar dolichoectasia.  Assessment: 87 y.o. male with medical history as above who presented to the ED via EMS on 12/17 as a Code Stroke for evaluation of rightward gaze, rightward head turn, and left-sided neglect.  - Examination reveals patient that is not oriented with a rightward gaze, rightward head turn, dysarthria, and left-sided neglect. His initial NIHSS is 17. - Imaging revealed an acute right PCA distribution  infarct at the level of the occipital and temporal lobes with advanced atrophy and vertebrobasilar dolichoectasia. CTA with RPCA occlusion.  - Patient is not a candidate for TNK due to his last known well time at 20:00 last night and is not a candidate or IR due to his mRs of 4.  - Stroke risk factors include patient's advanced age, remote tobacco use, HTN, HLD, DM, and atrial fibrillation. Will complete stroke work up.  Recommendations: - HgbA1c, fasting lipid panel - MRI of the brain without contrast - Frequent neuro checks - Echocardiogram - Prophylactic therapy- Antiplatelet med: Aspirin - dose 325mg  PO or 300mg  PR followed by ASA 81 mg PO daily - Hold Eliquis at this time-resumption timing to be decided after work up completion and stroke team rounding. - Risk factor modification - Telemetry monitoring - PT consult, OT consult, Speech consult - Stroke team to follow  Pt seen by NP/Neuro and later by MD. Note/plan to be edited by MD as needed.  Anibal Henderson, AGAC-NP [facility] Pager: [phone]   Attending Neurohospitalist Addendum Patient seen and examined with APP/Resident. Agree with the history and physical as documented above. Agree with the plan as documented, which I helped formulate. I have independently reviewed the chart, obtained history, review of systems and examined the patient.I have personally reviewed pertinent head/neck/spine imaging (CT/MRI). Please feel free to call with any questions.  -- Amie Portland, MD Neurologist [facility] Pager: [phone]

## 2021-12-10 NOTE — H&P (Signed)
History and Physical    Benjamin Grant EYC:[id number] DOB: Mar 23, 1930 DOA: 12/10/2021  PCP: Venia Carbon, MD Consultants:  [facility] - vascular surgery; [facility] - urology Patient coming from: [facility];  NOK: Daughter, Lenon Oms, [phone]   Chief Complaint: Stroke-like symptoms  HPI: Benjamin Grant is a 87 y.o. male with medical history significant of AAA; afib on Eliquis; dementia; DM; HTN; and HLD presenting with stroke-like symptoms.  He is mildly somnolent and has dysarthria but was able to answer some questions.  He reported that he is a burden to all of humanity but reported that he "would like nothing more" than having me come back later to check on him.  He is unable to provide further history.  I spoke with his daughter.  She reports that he usually has a cheerful and chatty demeanor and temperament.  He has pretty limited, mostly in a wheelchair.  He is able to have a conversation, but dementia limits context.      ED Course: LKW last night. On Eliquis.  Left facial drop, left side-weakness.  Code stroke but out of window.  Hold Eliquis, give ASA.    Review of Systems: Unable to effectively perform    Past Medical History:  Diagnosis Date   AAA (abdominal aortic aneurysm) ([place])    Aneurysm of artery (HCC)    popliteal   Atrial fibrillation (HCC)    Dementia (HCC)    Depression    Diabetes mellitus without complication ([place])    Diverticulosis large intestine w/o perforation or abscess w/bleeding    GERD without esophagitis    Gross hematuria    Hyperlipidemia    Hypertension    Ventricular tachycardia ([place])     Past Surgical History:  Procedure Laterality Date   TRANSURETHRAL RESECTION OF BLADDER TUMOR WITH MITOMYCIN-C N/A 05/09/2018   Procedure: TRANSURETHRAL RESECTION OF BLADDER TUMOR WITH Gemcitabine;  Surgeon: Abbie Sons, MD;  Location: [facility] ORS;  Service: Urology;  Laterality: N/A;    Social History   Socioeconomic  History   Marital status: Married    Spouse name: Not on file   Number of children: Not on file   Years of education: Not on file   Highest education level: Not on file  Occupational History   Not on file  Tobacco Use   Smoking status: Former   Smokeless tobacco: Never  Vaping Use   Vaping Use: Never used  Substance and Sexual Activity   Alcohol use: Not Currently   Drug use: Never   Sexual activity: Not Currently  Other Topics Concern   Not on file  Social History Narrative   Not on file   Social Determinants of Health   Financial Resource Strain: Not on file  Food Insecurity: Not on file  Transportation Needs: Not on file  Physical Activity: Not on file  Stress: Not on file  Social Connections: Not on file  Intimate Partner Violence: Not on file    Allergies  Allergen Reactions   Penicillins     Per MAR    No family history on file.  Prior to Admission medications   Medication Sig Start Date End Date Taking? Authorizing Provider  acetaminophen (TYLENOL) 500 MG tablet Take 1,000 mg by mouth every 4 (four) hours as needed for moderate pain or fever.     [provider]  aspirin EC 81 MG tablet Take 81 mg by mouth every morning.     [provider]  citalopram (  CELEXA) 10 MG tablet  07/23/18   [provider]  clindamycin (CLEOCIN) 150 MG capsule Take 600 mg by mouth See admin instructions. Take 600 mg by mouth 1 hour prior to dental appointment on 07/03/2018    [provider]  clindamycin (CLEOCIN) 300 MG capsule Take 300 mg by mouth 2 (two) times daily. 10/26/20   [provider]  digoxin (LANOXIN) 0.125 MG tablet Take 0.125 mg by mouth every morning.     [provider]  diltiazem (CARDIZEM CD) 180 MG 24 hr capsule  07/22/18   [provider]  diltiazem (TIAZAC) 180 MG 24 hr capsule Take 180 mg by mouth every morning.     [provider]  donepezil (ARICEPT) 10 MG tablet Take 10 mg by mouth at  bedtime.    [provider]  ELIQUIS 2.5 MG TABS tablet  07/25/19   [provider]  famotidine (PEPCID) 20 MG tablet Take 20 mg by mouth 2 (two) times daily.    [provider]  FARXIGA 10 MG TABS tablet Take 10 mg by mouth daily. 11/14/21   [provider]  finasteride (PROSCAR) 5 MG tablet Take 5 mg by mouth every morning.     [provider]  furosemide (LASIX) 40 MG tablet Take 20 mg by mouth daily. 11/16/20   [provider]  glipiZIDE (GLUCOTROL) 10 MG tablet Take 10 mg by mouth 2 (two) times daily.    [provider]  GOODSENSE GAS RELIEF 125 MG chewable tablet  08/11/19   [provider]  guaifenesin (ROBITUSSIN) 100 MG/5ML syrup Take 200 mg by mouth every 4 (four) hours as needed for cough.    [provider]  hydrocortisone (ANUSOL-HC) 2.5 % rectal cream Place 1 application rectally 2 (two) times daily as needed for hemorrhoids.     [provider]  insulin glargine (LANTUS) 100 UNIT/ML injection Inject 34 Units into the skin at bedtime.     [provider]  ketoconazole (NIZORAL) 2 % shampoo Apply 1 application topically See admin instructions. Apply to face, neck, and scalp twice weekly. Let sit for several minutes and rinse    [provider]  lisinopril (PRINIVIL,ZESTRIL) 10 MG tablet Take 10 mg by mouth every morning.     [provider]  memantine (NAMENDA) 10 MG tablet Take 10 mg by mouth 2 (two) times daily.    [provider]  metoprolol tartrate (LOPRESSOR) 25 MG tablet Take 12.5 mg by mouth every 12 (twelve) hours.     [provider]  Multiple Vitamin (THEREMS PO) Take 1 tablet by mouth every morning.     [provider]  [facility] powder Apply topically. 10/18/20   [provider]  simvastatin (ZOCOR) 10 MG tablet Take 10 mg by mouth every evening.     [provider]  simvastatin (ZOCOR) 20 MG tablet Take 20 mg by mouth  at bedtime. 11/21/20   [provider]  sitaGLIPtin (JANUVIA) 100 MG tablet Take 100 mg by mouth every morning.     [provider]  sodium chloride (OCEAN) 0.65 % SOLN nasal spray Place 1 spray into both nostrils See admin instructions. Place 1 spray in each nostril 4 times daily. May use 1 spray in each nostril every 2 hours as needed for dryness. May leave in room per [facility].    [provider]  sulfamethoxazole-trimethoprim (BACTRIM DS,SEPTRA DS) 800-160 MG tablet Take 1 tablet by mouth every 12 (twelve) hours.  10/05/18   Stoioff, Ronda Fairly, MD  tamsulosin (FLOMAX) 0.4 MG CAPS capsule Take 0.4 mg by mouth every morning.     [provider]  triamcinolone cream (KENALOG) 0.1 % Apply 1 application topically 3 (three) times daily as needed (rash/itching).    [provider]    Physical Exam: Vitals:   12/10/21 1200 12/10/21 1230 12/10/21 1300 12/10/21 1345  BP: 117/76 133/88 (!) 148/100 (!) 154/105  Pulse: 92 93 72 (!) 119  Resp: (!) 25 11 12 17   Temp:      TempSrc:      SpO2: 96% 95% 98% 97%  Weight:      Height:         General:  Appears calm and comfortable and is in NAD Eyes:  R lateral gaze, normal lids, iris ENT:  grossly normal lips & tongue, mmm Neck:  no LAD, masses or thyromegaly Cardiovascular:  RR with mild tachycardia, no m/r/g. No LE edema.  Respiratory:   CTA bilaterally with no wheezes/rales/rhonchi.  Normal respiratory effort. Abdomen:  soft, NT, ND Skin:  no rash or induration seen on limited exam Musculoskeletal:  no bony abnormality Psychiatric:  blunted/somnolent mood and affect, speech dysarthric Neurologic:  L facial droop Unable to perform exam due to lack of patient participation    Radiological Exams on Admission: Independently reviewed - see discussion in A/P where applicable  DG Chest 2 View  Result Date: 12/10/2021 CLINICAL DATA:  Code stroke EXAM: CHEST - 2 VIEW COMPARISON:  None. FINDINGS: The heart is  enlarged. Aorta is tortuous with atherosclerotic calcifications. Lungs are clear without evidence of focal consolidation or pleural effusion. No acute osseous abnormality. IMPRESSION: Cardiomegaly without evidence of acute cardiopulmonary process. Electronically Signed   By: Keane Police D.O.   On: 12/10/2021 14:38   CT HEAD CODE STROKE WO CONTRAST  Result Date: 12/10/2021 CLINICAL DATA:  Code stroke.  Acute stroke suspected EXAM: CT HEAD WITHOUT CONTRAST TECHNIQUE: Contiguous axial images were obtained from the base of the skull through the vertex without intravenous contrast. COMPARISON:  01/10/2012 FINDINGS: Brain: Cytotoxic edema in the right PCA territory at the level of the occipital and medial temporal lobes. No acute hemorrhage. Advanced brain atrophy. Chronic small vessel ischemia in the periventricular white matter. Vascular: Vertebrobasilar dolichoectasia.  No hyperdense vessel. Skull: Normal. Negative for fracture or focal lesion. Sinuses/Orbits: No acute finding. Other: These results were communicated to Dr. Rory Percy at 9:32 am on 12/10/2021 by text page via the [facility] messaging system. IMPRESSION: 1. Acute right PCA distribution infarct at the level of the occipital and temporal lobes. No acute hemorrhage. 2. Advanced brain atrophy. 3. Vertebrobasilar dolichoectasia. Electronically Signed   By: Jorje Guild M.D.   On: 12/10/2021 09:33   CT ANGIO HEAD NECK W WO CM (CODE STROKE)  Result Date: 12/10/2021 CLINICAL DATA:  Acute neuro deficit. EXAM: CT ANGIOGRAPHY HEAD AND NECK TECHNIQUE: Multidetector CT imaging of the head and neck was performed using the standard protocol during bolus administration of intravenous contrast. Multiplanar CT image reconstructions and MIPs were obtained to evaluate the vascular anatomy. Carotid stenosis measurements (when applicable) are obtained utilizing NASCET criteria, using the distal internal carotid diameter as the denominator. CONTRAST:  90mL OMNIPAQUE  IOHEXOL 350 MG/ML SOLN COMPARISON:  Head CT from the same day FINDINGS: CTA NECK FINDINGS Aortic arch: Atheromatous plaque with 3 vessel branching. Right carotid system: No evidence of dissection, stenosis (50% or greater) or occlusion. Mild for age atheromatous plaque Left carotid  system: No evidence of dissection, stenosis (50% or greater) or occlusion. Mild for age atheromatous plaque Vertebral arteries: Subclavian atherosclerosis on both sides. Dominant left vertebral artery. Skeleton: Advanced cervical spine degeneration Other neck: No acute finding Upper chest: Emphysema. Review of the MIP images confirms the above findings CTA HEAD FINDINGS Anterior circulation: Atheromatous calcification of the carotid siphons. Tandem severe stenoses at the right M1 and right MCA bifurcations with underfilling and subsequent atheromatous irregularity of branch vessels. Atheromatous irregularity and narrowing of left MCA vessels, especially at the M3 level and beyond. Negative for aneurysm Posterior circulation: Dominant left vertebral artery. Vertebrobasilar dolichoectasia and elongation. Basilar measures 6-7 mm in diameter. Right PCA branch occlusion with subsequent reconstitution. There is faint flow in the posterior circulation due to the tortuous nature and dilatation. Venous sinuses: Unremarkable for the arterial phase Anatomic variants: None significant Review of the MIP images confirms the above findings Case discussed with Dr. Rory Percy. IMPRESSION: 1. Short segment occlusion of the right PCA correlating with the acute infarct. 2. Severe tandem right MCA stenoses. 3. Vertebrobasilar dolichoectasia. Electronically Signed   By: Jorje Guild M.D.   On: 12/10/2021 09:57    EKG: pending   Labs on Admission: I have personally reviewed the available labs and imaging studies at the time of the admission.  Pertinent labs:   CO2 18 Glucose 201 WBC 13.3 INR 1.1 COVID/flu negative   Assessment/Plan Principal  Problem:   Acute CVA (cerebrovascular accident) ([place]) Active Problems:   Diabetes ([place])   Essential hypertension   Hyperlipidemia   PAF (paroxysmal atrial fibrillation) (HCC)   BPH (benign prostatic hyperplasia)   DNR (do not resuscitate)   Acute CVA -Patient presenting with L facial droop, dysarthria, R gaze preference -Concerning for CVA -Not a tPA candidate due to prolonged time since last seen (last evening) -CT confirms acute CVA -Aspirin has been given to reduce stroke mortality and decrease morbidity -Will admit for further CVA evaluation -Telemetry monitoring -MRI -Echo -Risk stratification with FLP, A1c -Patient will need DAPT for 21 days when ABCD2 score is at least 4 and NIH score is 3 or less, and then can transition to monotherapy with a single antiplatelet agent.  Since this patient has failed primary prevention with ASA, transition to Plavix appears to be reasonable.  Will defer to neurology for now. -Neurology consult -PT/OT/ST/Nutrition Consults -Depending on severity of CVA symptoms and progression, palliative care consult may be appropriate.  Afib -Hold Eliquis for now, per neurology -Rare controlled with Dig, Coreg, and Cardizem per [facility] - holding for now pending med rec and permissive HTN -I have requested Med Rec to be completed and have tried to look at facility medications but pharmacy appears to have this documentation -He does have mild tachycardia so will resume Dilt for now  Dementia -Continue Aricept, Namenda, Celexa  HTN -Allow permissive HTN for now -Treat BP only if >220/120, and then with goal of 15% reduction -Hold Lisinopril, diltiazem, Lopressor and plan to restart in 48-72 hours   HLD -Check FLP -Resume statin but will change Zocor to Lipitor 40 mg daily   DM -Will check A1c -Hold home PO medications (Januvia, Farxiga, Glucotrol) -Continue Lantus -Will order moderate-scale SSI  BPH -Continue Proscar, Flomax   DNR -I have  discussed code status with the patient 's daughter and the patient would not desire resuscitation and would prefer to die a natural death should that situation arise.    Note: This patient has been tested and is negative  for the novel coronavirus COVID-19.     Level of care: Telemetry Medical  DVT prophylaxis:  Lovenox  Code Status: DNR- confirmed with ACP documents/family Family Communication: None present; I spoke with his daughter by telephone at the time of admission Disposition Plan:  The patient is from: SNF  Anticipated d/c is to: SNF  Anticipated d/c date will depend on clinical response to treatment, but possibly as early as tomorrow if she has excellent response to treatment  Patient is currently: acutely ill Consults called: Neurology; PT/OT/ST/Nutrition; TOC team Admission status: Admit - It is my clinical opinion that admission to INPATIENT is reasonable and necessary because of the expectation that this patient will require hospital care that crosses at least 2 midnights to treat this condition based on the medical complexity of the problems presented.  Given the aforementioned information, the predictability of an adverse outcome is felt to be significant.       Karmen Bongo MD [facility]   How to contact the [facility] Attending or Consulting provider [place] or covering provider during after hours [place], for this patient?  Check the care team in [facility] and look for a) attending/consulting TR[facility] provider listed and b) the [facility] team listed Log into [email or web address] and use [place] universal password to access. If you do not have the password, please contact the hospital operator. Locate the [facility] provider you are looking for under [facility] and page to a number that you can be directly reached. If you still have difficulty reaching the provider, please page the [facility] (Director on Call) for the Hospitalists listed on amion for assistance.   12/10/2021, 4:20 PM

## 2021-12-10 NOTE — ED Triage Notes (Signed)
Pt BIB [facility] from [facility] for code stroke - LKW 12/17 @ 2000. Pt with R gaze and weakness. Pt to CT 3 on arrival. Pt with hx dementia. MOST form on arrival.

## 2021-12-10 NOTE — Code Documentation (Signed)
Stroke Response Nurse Documentation Code Documentation  Benjamin Grant is a 87 y.o. male arriving to [facility] ED via [facility] on 12/10/21 with past medical hx of atrial fib, diabetes, HLD, and HTN. On Eliquis (apixaban) daily. Code stroke was activated by EMS (EMS, ED).   Patient from facility where he was LKW at 2000 on 12/09/21 and now complaining of right sided facial droop.   Stroke team at the bedside on patient arrival. Labs drawn and patient cleared for CT by Dr. Laverta Baltimore. Patient to CT with team. NIHSS 17, see documentation for details and code stroke times. Patient with right gaze preference , left arm weakness, and left leg weakness on exam. The following imaging was completed:  CT and CTA (CT, CTA head and neck, CTP). Patient is not a candidate for IV Thrombolytic due to LKW time.  Care/Plan: q 15 neuro and vital signs.   Bedside handoff with ED RN Benjamin Grant.    Benjamin Grant  Rapid Response

## 2021-12-10 NOTE — ED Provider Notes (Signed)
[facility] EMERGENCY DEPARTMENT Provider Note   CSN: [id number] Arrival date & time: 12/10/21  9892  An emergency department physician performed an initial assessment on this suspected stroke patient at [phone].  History Chief Complaint  Patient presents with   Code Stroke    Benjamin Grant is a 87 y.o. male presenting for evaluation of strokelike symptoms.  Level 5 caveat due to dementia.  Per EMS, patient was last seen normal at 8 PM last night.  When the day nurse today checked on the patient around 7:00, she noticed new deficits.  Patient with a new left-sided facial droop, right gaze deviation, left upper extremity weakness.  History of A. fib on Eliquis, depression, diabetes, hypertension, hyperlipidemia  HPI     Past Medical History:  Diagnosis Date   AAA (abdominal aortic aneurysm) ([place])    Aneurysm of artery (HCC)    popliteal   Atrial fibrillation (HCC)    Dementia (HCC)    Depression    Diabetes mellitus without complication ([place])    Diverticulosis large intestine w/o perforation or abscess w/bleeding    GERD without esophagitis    Gross hematuria    Hyperlipidemia    Hypertension    Ventricular tachycardia [facility])     Patient Active Problem List   Diagnosis Date Noted   Acute CVA (cerebrovascular accident) ([place]) 12/10/2021   Urothelial carcinoma of bladder ([place]) 05/20/2018   AAA (abdominal aortic aneurysm) without rupture 08/04/2017   Diabetes ([place]) 08/04/2017   Essential hypertension 08/04/2017   Hyperlipidemia 08/04/2017   Popliteal artery aneurysm ([place]) 08/04/2017    Past Surgical History:  Procedure Laterality Date   TRANSURETHRAL RESECTION OF BLADDER TUMOR WITH MITOMYCIN-C N/A 05/09/2018   Procedure: TRANSURETHRAL RESECTION OF BLADDER TUMOR WITH Gemcitabine;  Surgeon: Abbie Sons, MD;  Location: [facility] ORS;  Service: Urology;  Laterality: N/A;       No family history on file.  Social History   Tobacco Use    Smoking status: Former   Smokeless tobacco: Never  Scientific laboratory technician Use: Never used  Substance Use Topics   Alcohol use: Not Currently   Drug use: Never    Home Medications Prior to Admission medications   Medication Sig Start Date End Date Taking? Authorizing Provider  acetaminophen (TYLENOL) 500 MG tablet Take 1,000 mg by mouth every 4 (four) hours as needed for moderate pain or fever.     [provider]  aspirin EC 81 MG tablet Take 81 mg by mouth every morning.     [provider]  citalopram (CELEXA) 10 MG tablet  07/23/18   [provider]  clindamycin (CLEOCIN) 150 MG capsule Take 600 mg by mouth See admin instructions. Take 600 mg by mouth 1 hour prior to dental appointment on 07/03/2018    [provider]  clindamycin (CLEOCIN) 300 MG capsule Take 300 mg by mouth 2 (two) times daily. 10/26/20   [provider]  digoxin (LANOXIN) 0.125 MG tablet Take 0.125 mg by mouth every morning.     [provider]  diltiazem (CARDIZEM CD) 180 MG 24 hr capsule  07/22/18   [provider]  diltiazem (TIAZAC) 180 MG 24 hr capsule Take 180 mg by mouth every morning.     [provider]  donepezil (ARICEPT) 10 MG tablet Take 10 mg by mouth at bedtime.    [provider]  ELIQUIS 2.5 MG TABS tablet  07/25/19   [provider]  famotidine (PEPCID) 20 MG tablet Take 20 mg by mouth 2 (two) times daily.    [provider]  FARXIGA 10 MG TABS tablet Take 10 mg by mouth daily. 11/14/21   [provider]  finasteride (PROSCAR) 5 MG tablet Take 5 mg by mouth every morning.     [provider]  furosemide (LASIX) 40 MG tablet Take 20 mg by mouth daily. 11/16/20   [provider]  glipiZIDE (GLUCOTROL) 10 MG tablet Take 10 mg by mouth 2 (two) times daily.    [provider]  GOODSENSE GAS RELIEF 125 MG chewable tablet  08/11/19   [provider]  guaifenesin  (ROBITUSSIN) 100 MG/5ML syrup Take 200 mg by mouth every 4 (four) hours as needed for cough.    [provider]  hydrocortisone (ANUSOL-HC) 2.5 % rectal cream Place 1 application rectally 2 (two) times daily as needed for hemorrhoids.     [provider]  insulin glargine (LANTUS) 100 UNIT/ML injection Inject 34 Units into the skin at bedtime.     [provider]  ketoconazole (NIZORAL) 2 % shampoo Apply 1 application topically See admin instructions. Apply to face, neck, and scalp twice weekly. Let sit for several minutes and rinse    [provider]  lisinopril (PRINIVIL,ZESTRIL) 10 MG tablet Take 10 mg by mouth every morning.     [provider]  memantine (NAMENDA) 10 MG tablet Take 10 mg by mouth 2 (two) times daily.    [provider]  metoprolol tartrate (LOPRESSOR) 25 MG tablet Take 12.5 mg by mouth every 12 (twelve) hours.     [provider]  Multiple Vitamin (THEREMS PO) Take 1 tablet by mouth every morning.     [provider]  [facility] powder Apply topically. 10/18/20   [provider]  simvastatin (ZOCOR) 10 MG tablet Take 10 mg by mouth every evening.     [provider]  simvastatin (ZOCOR) 20 MG tablet Take 20 mg by mouth at bedtime. 11/21/20   [provider]  sitaGLIPtin (JANUVIA) 100 MG tablet Take 100 mg by mouth every morning.     [provider]  sodium chloride (OCEAN) 0.65 % SOLN nasal spray Place 1 spray into both nostrils See admin instructions. Place 1 spray in each nostril 4 times daily. May use 1 spray in each nostril every 2 hours as needed for dryness. May leave in room per [facility]    [provider]  sulfamethoxazole-trimethoprim (BACTRIM DS,SEPTRA DS) 800-160 MG tablet Take 1 tablet by mouth every 12 (twelve) hours. 10/05/18   Stoioff, Ronda Fairly, MD  tamsulosin (FLOMAX) 0.4 MG CAPS capsule Take 0.4 mg by mouth every morning.     [provider]   triamcinolone cream (KENALOG) 0.1 % Apply 1 application topically 3 (three) times daily as needed (rash/itching).    [provider]    Allergies    Penicillins  Review of Systems   Review of Systems  Unable to perform ROS: Dementia  Eyes:  Positive for visual disturbance.  Neurological:  Positive for weakness.   Physical Exam Updated Vital Signs BP (!) 134/92 (BP Location: Right Arm)    Pulse (!) 109    Temp 97.7 F (36.5 C) (Oral)    Resp 14    Ht 6' (1.829 m)    Wt 82 kg    SpO2 97%    BMI 24.52 kg/m   Physical Exam Vitals and nursing note reviewed.  Constitutional:  General: He is not in acute distress.    Appearance: Normal appearance.  HENT:     Head: Normocephalic and atraumatic.  Eyes:     Comments: Rightward gaze deviation   Cardiovascular:     Rate and Rhythm: Normal rate and regular rhythm.     Pulses: Normal pulses.  Pulmonary:     Effort: Pulmonary effort is normal. No respiratory distress.     Breath sounds: Normal breath sounds. No wheezing.     Comments: Speaking in full sentences.  Clear lung sounds in all fields. Abdominal:     General: There is no distension.     Palpations: Abdomen is soft. There is no mass.     Tenderness: There is no abdominal tenderness. There is no guarding or rebound.  Musculoskeletal:     Cervical back: Normal range of motion and neck supple.     Comments: Difficult to assess due to patient being uncooperative/having difficulty following commands.  Appears to be decreased movement and slight weakness of the left upper and lower extremity on exam.  Skin:    General: Skin is warm and dry.     Capillary Refill: Capillary refill takes less than 2 seconds.  Neurological:     Mental Status: He is alert. Mental status is at baseline.     GCS: GCS eye subscore is 4. GCS verbal subscore is 5. GCS motor subscore is 6.     Cranial Nerves: Facial asymmetry present.     Motor: Weakness and pronator drift present.      Comments: Alert to person  Psychiatric:        Mood and Affect: Mood and affect normal.        Behavior: Behavior normal.    ED Results / Procedures / Treatments   Labs (all labs ordered are listed, but only abnormal results are displayed) Labs Reviewed  CBC - Abnormal; Notable for the following components:      Result Value   WBC 13.3 (*)    Hemoglobin 17.1 (*)    HCT 55.2 (*)    MCV 107.0 (*)    All other components within normal limits  DIFFERENTIAL - Abnormal; Notable for the following components:   Neutro Abs 10.6 (*)    Monocytes Absolute 1.2 (*)    All other components within normal limits  COMPREHENSIVE METABOLIC PANEL - Abnormal; Notable for the following components:   CO2 18 (*)    Glucose, Bld 201 (*)    All other components within normal limits  I-STAT CHEM 8, ED - Abnormal; Notable for the following components:   BUN 26 (*)    Glucose, Bld 203 (*)    Calcium, Ion 1.01 (*)    Hemoglobin 17.3 (*)    All other components within normal limits  CBG MONITORING, ED - Abnormal; Notable for the following components:   Glucose-Capillary 201 (*)    All other components within normal limits  RESP PANEL BY RT-PCR (FLU A&B, COVID) ARPGX2  PROTIME-INR  APTT    EKG None  Radiology CT HEAD CODE STROKE WO CONTRAST  Result Date: 12/10/2021 CLINICAL DATA:  Code stroke.  Acute stroke suspected EXAM: CT HEAD WITHOUT CONTRAST TECHNIQUE: Contiguous axial images were obtained from the base of the skull through the vertex without intravenous contrast. COMPARISON:  01/10/2012 FINDINGS: Brain: Cytotoxic edema in the right PCA territory at the level of the occipital and medial temporal lobes. No acute hemorrhage. Advanced brain atrophy. Chronic small vessel ischemia in the  periventricular white matter. Vascular: Vertebrobasilar dolichoectasia.  No hyperdense vessel. Skull: Normal. Negative for fracture or focal lesion. Sinuses/Orbits: No acute finding. Other: These results were  communicated to Dr. Rory Percy at 9:32 am on 12/10/2021 by text page via the [facility] messaging system. IMPRESSION: 1. Acute right PCA distribution infarct at the level of the occipital and temporal lobes. No acute hemorrhage. 2. Advanced brain atrophy. 3. Vertebrobasilar dolichoectasia. Electronically Signed   By: Jorje Guild M.D.   On: 12/10/2021 09:33   CT ANGIO HEAD NECK W WO CM (CODE STROKE)  Result Date: 12/10/2021 CLINICAL DATA:  Acute neuro deficit. EXAM: CT ANGIOGRAPHY HEAD AND NECK TECHNIQUE: Multidetector CT imaging of the head and neck was performed using the standard protocol during bolus administration of intravenous contrast. Multiplanar CT image reconstructions and MIPs were obtained to evaluate the vascular anatomy. Carotid stenosis measurements (when applicable) are obtained utilizing NASCET criteria, using the distal internal carotid diameter as the denominator. CONTRAST:  70mL OMNIPAQUE IOHEXOL 350 MG/ML SOLN COMPARISON:  Head CT from the same day FINDINGS: CTA NECK FINDINGS Aortic arch: Atheromatous plaque with 3 vessel branching. Right carotid system: No evidence of dissection, stenosis (50% or greater) or occlusion. Mild for age atheromatous plaque Left carotid system: No evidence of dissection, stenosis (50% or greater) or occlusion. Mild for age atheromatous plaque Vertebral arteries: Subclavian atherosclerosis on both sides. Dominant left vertebral artery. Skeleton: Advanced cervical spine degeneration Other neck: No acute finding Upper chest: Emphysema. Review of the MIP images confirms the above findings CTA HEAD FINDINGS Anterior circulation: Atheromatous calcification of the carotid siphons. Tandem severe stenoses at the right M1 and right MCA bifurcations with underfilling and subsequent atheromatous irregularity of branch vessels. Atheromatous irregularity and narrowing of left MCA vessels, especially at the M3 level and beyond. Negative for aneurysm Posterior circulation: Dominant  left vertebral artery. Vertebrobasilar dolichoectasia and elongation. Basilar measures 6-7 mm in diameter. Right PCA branch occlusion with subsequent reconstitution. There is faint flow in the posterior circulation due to the tortuous nature and dilatation. Venous sinuses: Unremarkable for the arterial phase Anatomic variants: None significant Review of the MIP images confirms the above findings Case discussed with Dr. Rory Percy. IMPRESSION: 1. Short segment occlusion of the right PCA correlating with the acute infarct. 2. Severe tandem right MCA stenoses. 3. Vertebrobasilar dolichoectasia. Electronically Signed   By: Jorje Guild M.D.   On: 12/10/2021 09:57    Procedures Procedures   Medications Ordered in ED Medications  sodium chloride flush (NS) 0.9 % injection 3 mL (3 mLs Intravenous Given 12/10/21 1004)  iohexol (OMNIPAQUE) 350 MG/ML injection 75 mL (75 mLs Intravenous Contrast Given 12/10/21 0946)    ED Course  I have reviewed the triage vital signs and the nursing notes.  Pertinent labs & imaging results that were available during my care of the patient were reviewed by me and considered in my medical decision making (see chart for details).    MDM Rules/Calculators/A&P                          Patient presenting for evaluation of left-sided facial droop, left extremity weakness, and rightward gaze deviation.  On exam, patient appears to have acute neurologic deficits, concerning for stroke.  Last seen normal 8 PM last night.  He is on Eliquis.  Patient evaluated initially with the neurology team, taken to the CT scanner.  CT head negative for acute bleed.  CTA with perfusion ordered.  Labs interpreted  by me, overall reassuring.  CT head shows a new stroke, CTA shows occlusion.  However due to patient's poor baseline status and current anticoagulation, he is not a candidate for IR procedure.  He is outside of the window for tPA.  Discussed with Dr. Rory Percy from neurology, who recommends  holding Eliquis, starting aspirin, admitting to medicine.  Dr. Rory Percy is discussing the case with family via the phone and notifying them of patient's condition/plan.   Discussed with Dr. Lorin Mercy from [facility] hospitalist service, patient to be admitted.    Final Clinical Impression(s) / ED Diagnoses Final diagnoses:  Cerebrovascular accident (CVA), unspecified mechanism [facility])    Rx / [place] Orders ED Discharge Orders     None        Franchot Heidelberg, PA-C 12/10/21 1028    Charlesetta Shanks, MD 12/10/21 2333

## 2021-12-10 NOTE — ED Notes (Signed)
Spoke with secretary admit provider paged to discuss pt failing the swallow study and having po meds ordered

## 2021-12-10 NOTE — ED Notes (Signed)
Received verbal report from Cyndia Skeeters RN at this time

## 2021-12-10 NOTE — ED Notes (Signed)
Pt failed swallow screen, keep NPO. ASA given rectally. Pt HR irregular in 120s. RN sent secure chat to Dr Lorin Mercy re: pt home meds of cardizem and metoprolol, asked for IV equivalent. Pt very confused, R gaze, sexually inappropriate towards staff. RN spoke to pt's facility, they say he is inappropriate at baseline. Pt also fixated on certain conversation topics, notably repetitive and not related to current situation.

## 2021-12-10 NOTE — Progress Notes (Signed)
Pt unable to take POs: 1) will have to hold all PO meds 2) holding lantus 3) changing SSI to sensitive scale Q4H for the moment.

## 2021-12-11 ENCOUNTER — Inpatient Hospital Stay (HOSPITAL_COMMUNITY): Payer: Medicare Other

## 2021-12-11 ENCOUNTER — Telehealth: Payer: Self-pay | Admitting: Internal Medicine

## 2021-12-11 DIAGNOSIS — Z66 Do not resuscitate: Secondary | ICD-10-CM

## 2021-12-11 DIAGNOSIS — Z515 Encounter for palliative care: Secondary | ICD-10-CM

## 2021-12-11 DIAGNOSIS — I6389 Other cerebral infarction: Secondary | ICD-10-CM

## 2021-12-11 DIAGNOSIS — F0390 Unspecified dementia without behavioral disturbance: Secondary | ICD-10-CM

## 2021-12-11 LAB — LIPID PANEL
Cholesterol: 154 mg/dL (ref 0–200)
HDL: 48 mg/dL (ref >40)
LDL Cholesterol: 90 mg/dL (ref 0–99)
Total CHOL/HDL Ratio: 3.2 RATIO
Triglycerides: 79 mg/dL (ref <150)
VLDL: 16 mg/dL (ref 0–40)

## 2021-12-11 LAB — ECHOCARDIOGRAM COMPLETE
Height: 72 in
S' Lateral: 2.6 cm
Weight: 2892.44 oz

## 2021-12-11 LAB — CBG MONITORING, ED
Glucose-Capillary: 104 mg/dL — ABNORMAL HIGH (ref 70–99)
Glucose-Capillary: 90 mg/dL (ref 70–99)
Glucose-Capillary: 117 mg/dL — ABNORMAL HIGH (ref 70–99)
Glucose-Capillary: 140 mg/dL — ABNORMAL HIGH (ref 70–99)

## 2021-12-11 LAB — HEMOGLOBIN A1C
Hgb A1c MFr Bld: 8.4 % — ABNORMAL HIGH (ref 4.8–5.6)
Mean Plasma Glucose: 194.38 mg/dL

## 2021-12-11 MED ORDER — MORPHINE SULFATE (CONCENTRATE) 10 MG/0.5ML PO SOLN: 5.0000 mg | ORAL | Status: DC | PRN

## 2021-12-11 MED ORDER — MORPHINE SULFATE (PF) 2 MG/ML IV SOLN
1.0000 mg | INTRAVENOUS | Status: DC | PRN
  Administered 2021-12-11 – 2021-12-12 (×2): 1 mg via INTRAVENOUS
  Filled 2021-12-11 (×2): qty 1

## 2021-12-11 MED ORDER — GLYCOPYRROLATE 1 MG PO TABS
1.0000 mg | ORAL_TABLET | ORAL | Status: AC | PRN
Start: 2021-12-11 — End: ?

## 2021-12-11 MED ORDER — MORPHINE SULFATE (CONCENTRATE) 10 MG/0.5ML PO SOLN: 2.5000 mg | ORAL | Status: DC | PRN

## 2021-12-11 MED ORDER — GLYCOPYRROLATE 0.2 MG/ML IJ SOLN: 0.2000 mg | INTRAMUSCULAR | Status: DC | PRN

## 2021-12-11 MED ORDER — POLYVINYL ALCOHOL 1.4 % OP SOLN: 1.0000 [drp] | Freq: Four times a day (QID) | OPHTHALMIC | Status: DC | PRN

## 2021-12-11 MED ORDER — HALOPERIDOL LACTATE 2 MG/ML PO CONC: 0.5000 mg | ORAL | Status: DC | PRN

## 2021-12-11 MED ORDER — ONDANSETRON HCL 4 MG/2ML IJ SOLN: 4.0000 mg | Freq: Four times a day (QID) | INTRAMUSCULAR | Status: DC | PRN

## 2021-12-11 MED ORDER — MORPHINE SULFATE (CONCENTRATE) 10 MG/0.5ML PO SOLN: 5.0000 mg | Freq: Four times a day (QID) | ORAL | 0 refills | Status: AC

## 2021-12-11 MED ORDER — HALOPERIDOL 0.5 MG PO TABS: 0.5000 mg | ORAL_TABLET | ORAL | Status: DC | PRN

## 2021-12-11 MED ORDER — ONDANSETRON 4 MG PO TBDP: 4.0000 mg | ORAL_TABLET | Freq: Four times a day (QID) | ORAL | Status: DC | PRN

## 2021-12-11 MED ORDER — GLYCOPYRROLATE 1 MG PO TABS: 1.0000 mg | ORAL_TABLET | ORAL | Status: DC | PRN

## 2021-12-11 MED ORDER — BIOTENE DRY MOUTH MT LIQD: 15.0000 mL | OROMUCOSAL | Status: DC | PRN

## 2021-12-11 MED ORDER — POLYVINYL ALCOHOL 1.4 % OP SOLN
1.0000 [drp] | Freq: Four times a day (QID) | OPHTHALMIC | 0 refills | Status: AC | PRN
Start: 2021-12-11 — End: ?

## 2021-12-11 MED ORDER — MORPHINE SULFATE (CONCENTRATE) 10 MG/0.5ML PO SOLN
5.0000 mg | Freq: Four times a day (QID) | ORAL | Status: DC
  Administered 2021-12-11 – 2021-12-12 (×2): 5 mg via SUBLINGUAL
  Filled 2021-12-11 (×2): qty 0.5

## 2021-12-11 MED ORDER — HALOPERIDOL 0.5 MG PO TABS: 0.5000 mg | ORAL_TABLET | ORAL | Status: AC | PRN

## 2021-12-11 MED ORDER — HALOPERIDOL LACTATE 5 MG/ML IJ SOLN: 0.5000 mg | INTRAMUSCULAR | Status: DC | PRN

## 2021-12-11 MED ORDER — DIPHENHYDRAMINE HCL 50 MG/ML IJ SOLN: 12.5000 mg | INTRAMUSCULAR | Status: DC | PRN

## 2021-12-11 MED ORDER — MAGIC MOUTHWASH: 15.0000 mL | Freq: Four times a day (QID) | ORAL | Status: DC | PRN

## 2021-12-11 NOTE — Telephone Encounter (Signed)
Left message on verified VM as number given in the message. Advised of Dr Alla German message and if they have any other questions to contact [facility].

## 2021-12-11 NOTE — ED Notes (Signed)
[facility] called for transport.  

## 2021-12-11 NOTE — Progress Notes (Addendum)
STROKE TEAM PROGRESS NOTE   INTERVAL HISTORY Patient is seen in his room with his daughter at the bedside.  He was admitted from a memory care facility with right sided gaze deviation and left-sided weakness.  TNK was not given as patient presented outside the window and thrombectomy was not considered due to baseline mRs of 4.  MRI reveals a large right PCA stroke.  CT angio shows right PCA occlusion.  Patient is disoriented and unable to cooperate with exam, states he wishes to be left alone.  Patient's condition and prognosis discussed with patient's daughter, who states that patient had previously expressed that he would not want life-sustaining measures in this case.  Plan is to transfer patient back to his facility with hospice support.  Vitals:   12/11/21 0200 12/11/21 0400 12/11/21 0600 12/11/21 1000  BP: (!) 175/116 (!) 149/110 (!) 157/133 (!) 172/116  Pulse: (!) 116 (!) 112 (!) 114 (!) 129  Resp: 18 (!) 30 20 (!) 31  Temp:      TempSrc:      SpO2: 90% 98% 97% 95%  Weight:      Height:       CBC:  Recent Labs  Lab 12/10/21 0917 12/10/21 0923  WBC 13.3*  --   NEUTROABS 10.6*  --   HGB 17.1* 17.3*  HCT 55.2* 51.0  MCV 107.0*  --   PLT 171  --    Basic Metabolic Panel:  Recent Labs  Lab 12/10/21 0917 12/10/21 0923  NA 140 141  K 4.0 3.9  CL 107 107  CO2 18*  --   GLUCOSE 201* 203*  BUN 21 26*  CREATININE 1.06 0.90  CALCIUM 9.3  --    Lipid Panel:  Recent Labs  Lab 12/11/21 0308  CHOL 154  TRIG 79  HDL 48  CHOLHDL 3.2  VLDL 16  LDLCALC 90   HgbA1c:  Recent Labs  Lab 12/11/21 0308  HGBA1C 8.4*   Urine Drug Screen: No results for input(s): LABOPIA, COCAINSCRNUR, LABBENZ, AMPHETMU, THCU, LABBARB in the last 168 hours.  Alcohol Level No results for input(s): ETH in the last 168 hours.  IMAGING past 24 hours DG Chest 2 View  Result Date: 12/10/2021 CLINICAL DATA:  Code stroke EXAM: CHEST - 2 VIEW COMPARISON:  None. FINDINGS: The heart is enlarged.  Aorta is tortuous with atherosclerotic calcifications. Lungs are clear without evidence of focal consolidation or pleural effusion. No acute osseous abnormality. IMPRESSION: Cardiomegaly without evidence of acute cardiopulmonary process. Electronically Signed   By: Keane Police D.O.   On: 12/10/2021 14:38   MR BRAIN WO CONTRAST  Result Date: 12/10/2021 CLINICAL DATA:  Stroke, follow-up EXAM: MRI HEAD WITHOUT CONTRAST TECHNIQUE: Multiplanar, multiecho pulse sequences of the brain and surrounding structures were obtained without intravenous contrast. COMPARISON:  CT/CTA head dated earlier the same day FINDINGS: Brain: There is diffusion restriction with associated T2/FLAIR signal abnormality throughout the right PCA distribution involving the hippocampus, occipital lobe, and small portions of the right thalamus and distal caudate body consistent with acute infarct. There is sulcal effacement but no midline shift. There is no evidence of hemorrhagic transformation. There is a background of moderate to advanced global parenchymal volume loss with ex vacuo dilation of the ventricular system. Confluent FLAIR signal abnormality in the remainder of the subcortical and periventricular white matter likely reflects sequela of chronic white matter microangiopathy. There is a small remote infarct in the right cerebellar hemisphere and additional lacunar infarcts in the left  basal ganglia. There is no solid mass lesion. Vascular: The major flow voids are preserved. 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 Skull and upper cervical spine: Normal marrow signal. Sinuses/Orbits: The paranasal sinuses are clear. A left lens implant is noted. The globes and orbits are otherwise unremarkable. Other: None. IMPRESSION: 1. Acute right PCA territorial infarct without hemorrhagic transformation. 2. Advanced global parenchymal volume loss and chronic white matter microangiopathy. Electronically Signed   By: Valetta Mole  M.D.   On: 12/10/2021 16:30   ECHOCARDIOGRAM COMPLETE  Result Date: 12/11/2021    ECHOCARDIOGRAM REPORT   Patient Name:   Benjamin Grant Date of Exam: 12/11/2021 Medical Rec #:  [id number]           Height:       72.0 in Accession #:    [id number]          Weight:       180.8 lb Date of Birth:  1930/09/22           BSA:          2.041 m Patient Age:    87 years            BP:           149/110 mmHg Patient Gender: M                   HR:           122 bpm. Exam Location:  Inpatient Procedure: 2D Echo, Cardiac Doppler and Color Doppler Indications:    Stroke  History:        Patient has no prior history of Echocardiogram examinations.                 Risk Factors:Hypertension and Diabetes.  Sonographer:    Jyl Heinz Referring Phys: [place]  1. Technically difficult study with limited views, and patient in Afib with RVR. Left ventricular ejection fraction, by estimation, is 50 to 55%. The left ventricle has low normal function. Left ventricular endocardial border not optimally defined to evaluate regional wall motion. There is mild left ventricular hypertrophy. Left ventricular diastolic parameters are indeterminate.  2. Right ventricular systolic function is normal. The right ventricular size is normal.  3. The mitral valve is grossly normal. No evidence of mitral valve regurgitation.  4. The aortic valve was not well visualized. Aortic valve regurgitation is not visualized.  5. The inferior vena cava is normal in size with greater than 50% respiratory variability, suggesting right atrial pressure of 3 mmHg. FINDINGS  Left Ventricle: Left ventricular ejection fraction, by estimation, is 50 to 55%. The left ventricle has low normal function. Left ventricular endocardial border not optimally defined to evaluate regional wall motion. The left ventricular internal cavity  size was normal in size. There is mild left ventricular hypertrophy. Left ventricular diastolic parameters are  indeterminate. Right Ventricle: The right ventricular size is normal. Right vetricular wall thickness was not well visualized. Right ventricular systolic function is normal. Left Atrium: Left atrial size was not well visualized. Right Atrium: Right atrial size was not well visualized. Pericardium: Trivial pericardial effusion is present. Mitral Valve: The mitral valve is grossly normal. No evidence of mitral valve regurgitation. Tricuspid Valve: The tricuspid valve is grossly normal. Tricuspid valve regurgitation is trivial. Aortic Valve: The aortic valve was not well visualized. Aortic valve regurgitation is not visualized. Pulmonic Valve: The pulmonic valve was not well visualized. Pulmonic valve regurgitation is  not visualized. Aorta: The aortic root and ascending aorta are structurally normal, with no evidence of dilitation. Venous: The inferior vena cava is normal in size with greater than 50% respiratory variability, suggesting right atrial pressure of 3 mmHg. IAS/Shunts: The interatrial septum was not well visualized.  LEFT VENTRICLE PLAX 2D LVIDd:         3.80 cm LVIDs:         2.60 cm LV PW:         1.50 cm LV IVS:        1.10 cm LVOT diam:     2.20 cm LVOT Area:     3.80 cm  IVC IVC diam: 1.90 cm LEFT ATRIUM         Index LA diam:    3.80 cm 1.86 cm/m   AORTA Ao Root diam: 3.80 cm Ao Asc diam:  3.50 cm TRICUSPID VALVE TR Peak grad:   12.4 mmHg TR Vmax:        176.00 cm/s  SHUNTS Systemic Diam: 2.20 cm Oswaldo Milian MD Electronically signed by Oswaldo Milian MD Signature Date/Time: 12/11/2021/10:50:37 AM    Final     PHYSICAL EXAM General:  Well-developed elderly male in mild distress.  Restless and agitated  Neurological: Limited due to patient lack of cooperation.  He is drowsy but can be aroused with some difficulty patient is disoriented, unable to answer questions or follow commands.  Gaze is deviated to the right.  Blinks to threat on the right but not on the left.  He is able to  move all extremities spontaneously but is noticeably weaker on the left side.  There appears to be some left hemineglect.  ASSESSMENT/PLAN Benjamin Grant is a 87 y.o. male with history of AAA s/p stent graft, atrial fibrillation on Eliquis, dementia, depression, T2DM, HTN, HLD and popliteal artery aneurysm presenting with right sided gaze deviation and left sided weakness. He was admitted from a memory care facility with right sided gaze deviation and left-sided weakness.  TNK was not given as patient presented outside the window and thrombectomy was not considered due to baseline mRs of 4.  MRI reveals a large right PCA stroke.  Patient is disoriented and unable to cooperate with exam, states he wishes to be left alone.  Patient's condition and prognosis discussed with patient's daughter, who states that patient had previously expressed that he would not want life-sustaining measures in this case.  Patient was placed on comfort care.  Plan is to transfer patient back to his facility with hospice support.  Stroke:  right of right posterior cerebral artery infarct due to right PCA occlusion likely secondary due to embolism from atrial fibrillation Code Stroke CT head Acute right PCA distribution infarct at temporal and occipital lobes.  No acute hemorrhage, atrophy, vertebrobasilar dolichoectasia CTA head & neck Short segment occlusion of right PCA, severe tandem right MCA stenoses, vertebrobasilar dolichoectasia MRI  Acute right PCA infarct 2D Echo EF 50-55%, mild LVH, interatrial septum not well visualized LDL 90 HgbA1c 8.4 VTE prophylaxis - none, patient on comfort care    There are no active orders of the following types: Diet, Nourishments.   Eliquis (apixaban) daily prior to admission, now on No antithrombotic. Secondary to patient being on comfort care Therapy recommendations:  n/a Disposition:  likely to facility with hospice support  Hypertension Home meds:  lisinopril 10 mg  daily Stable Permissive hypertension (OK if < 220/120) but gradually normalize in 5-7 days Long-term BP goal  normotensive  Hyperlipidemia Home meds: none LDL 90, goal < 70 High intensity statin not indicated as patient is on comfort care Continue statin at discharge  Diabetes type II Uncontrolled Home meds:  glipzide 10 mg daily, farxiga 10 mg daily HgbA1c 8.4, goal < 7.0 CBGs Recent Labs    12/11/21 0005 12/11/21 0310 12/11/21 0743  GLUCAP 90 104* 117*    SSI  Atrial fibrillation Patient was taking Eliquis at home Anticoagulants held as patient is on comfort care   Other Stroke Risk Factors Advanced Age >/= 22    Other Active Problems Dementia Patient was taking Aricept as outpatient Delirium precautions  Hospital day # [place] , MSN, AGACNP-BC [facility] See Amion for schedule and pager information 12/11/2021 11:34 AM   STROKE MD NOTE : I have personally obtained history,examined this patient, reviewed notes, independently viewed imaging studies, participated in medical decision making and plan of care.ROS completed by me personally and pertinent positives fully documented  I have made any additions or clarifications directly to the above note. Agree with note above.  Patient presented with right PCA infarct due to right posterior cerebral artery occlusion likely embolism from atrial fibrillation.  Patient appears quite stressed restless and uncooperative his prognosis for living independently without assistance is quite poor.  Patient daughter feels patient would not want to live like this and is agreeable to comfort care measures and wants patient to be transferred to an Nursing facility for hospice care.  Long discussion with Dr. Maren Beach and patient daughter and answered questions. Greater than 50% time during this 35-minute visit was spent in counseling and coordination of care about his embolic stroke and discussion about patient's  goals of care and hospice with the daughter on some questions.  No further stroke work-up is necessary and cancel remaining work-up.  Stroke team will sign off.  Kindly call for questions  Antony Contras, MD Medical Director [place] Pager: [phone] 12/11/2021 4:33 PM   To contact Stroke Continuity provider, please refer to [email or web address] After hours, contact General Neurology

## 2021-12-11 NOTE — Discharge Summary (Addendum)
Physician Discharge Summary  Benjamin Grant XNT:[id number] DOB: 11/10/1930 DOA: 12/10/2021  PCP: Venia Carbon, MD  Admit date: 12/10/2021 Discharge date: 12/11/2021  Admitted From: NH Disposition:  NH w/ hospice  Recommendations for Outpatient Follow-up:  Follow up with  hospice at facility. Home Health:no  Equipment/Devices: none  Discharge Condition: Stable Code Status:   Code Status: DNR Diet recommendation:  Diet Order     None     Brief/Interim Summary:Benjamin Grant, 87 y.o. male with PMH of  AAA; afib on Eliquis; dementia; DM; HTN; and HLD present did with the strokelike symptoms.  In the ED he was somnolent, dysarthric, reported that he is a burden to all of humanity, was unable to provide further history.  Admitting doctor I spoke with the daughter, as per report at baseline he is  cheerful and chatty demeanor and temperament, has been pretty limited, mostly in a wheelchair.  He is able to have a conversation, but dementia limits context.ED Course: LKW 12/18 night. He had  Left facial drop, left side-weakness.  Code stroke was activated but out of window for tPA, patient was admitted for further management  Daughter informed and was driving from [place] to [facility]. MRI showed large right PCA territorial infarct.  Seen by neurology.  Palliative care consulted.  After extensive discussion they want to proceed with comfort measures hospice care [facility] consult placed. Discharge back to facility with hospice care once arranged.  Discharge Diagnoses:  Acute large right PCA territorial CVA with left facial droop dysarthria right gaze preference: MRI Brain: With acute right PCA territory infarct, advanced global parenchymal volume loss chronic white matter microangiopathy CTA head and neck-Short segment occlusion of right PCA correlating with acute infarct, severe tandem right MCA stenosis, vertebral basilar dolichoectasia Echocardiogram pending-completed this  morning Prophylactic therapy-rectal ASA HgbA1c-8.4, fasting lipid panel-LDL 90 Neurology consult appreciated the discussion with the daughter and at this time plan to convert to comfort measures hospice.palliative care involved.  Diabetes: A1c 8.4, blood sugar stable  Essential hypertension: Stable.holding lisinopril diltiazem Lopressor Hyperlipidemia: On statin at home.   PAF with RVR rate uncontrolled, Eliquis held per neurology, unable to take p.o. BPH:prn bladder scan   End-of-life care  Goals of care: DNR status on admission, given his acute CVA failed swallow eval at bedside, after neurology evaluation at this time stopping further work-up based upon baseline significant dementia and patient's wishes daughter requesting for the patient to return back to the facility with hospice care [facility] consulted  DAUGHTER arrived later at bedside- neurology had extension discussion with the patient's daughter-family opting for comfort measures, I confirmed with the daughter over the phone.  We discussed about keeping him comfortable with this continue PT OT speech, keeping an eye seems comfort feeding, medication to focus [facility] consult requested as daughter wants to have him go back to [facility]  with hospice but if it is not option then eval for inpatient hospice/[facility]  Consults: Neurology  Pmt  Subjective: Seen and examined this morning able to wake up and tell me his name date of birth not able to hold another conversation echo is being done Overnight patient's failed swallow eval unable to take oral meds Tachycardic with A. fib, blood pressure stable.  Discharge Exam: Vitals:   12/11/21 1000 12/11/21 1300  BP: (!) 172/116 (!) 158/109  Pulse: (!) 129 (!) 128  Resp: (!) 31 16  Temp:    SpO2: 95% 100%   General: Pt is alert, awake,  not in acute distress Cardiovascular: RRR, S1/S2 +, no rubs, no gallops Respiratory: CTA bilaterally, no wheezing, no rhonchi Abdominal: Soft,  NT, ND, bowel sounds + Extremities: no edema, no cyanosis  Discharge Instructions  Discharge Instructions     Discharge instructions   Complete by: As directed    Continue hospice at facility   Increase activity slowly   Complete by: As directed       Allergies as of 12/11/2021       Reactions   Penicillins    Per MAR        Medication List     STOP taking these medications    acetaminophen 650 MG CR tablet Commonly known as: TYLENOL   citalopram 10 MG tablet Commonly known as: CELEXA   digoxin 0.125 MG tablet Commonly known as: LANOXIN   diltiazem 180 MG 24 hr capsule Commonly known as: TIAZAC   Eliquis 2.5 MG Tabs tablet Generic drug: apixaban   famotidine 20 MG tablet Commonly known as: PEPCID   Farxiga 10 MG Tabs tablet Generic drug: dapagliflozin propanediol   finasteride 5 MG tablet Commonly known as: PROSCAR   furosemide 40 MG tablet Commonly known as: LASIX   glipiZIDE 10 MG tablet Commonly known as: GLUCOTROL   insulin glargine 100 UNIT/ML injection Commonly known as: LANTUS   lisinopril 10 MG tablet Commonly known as: ZESTRIL   melatonin 5 MG Tabs   memantine 10 MG tablet Commonly known as: NAMENDA   metoprolol tartrate 25 MG tablet Commonly known as: LOPRESSOR   simvastatin 20 MG tablet Commonly known as: ZOCOR   sodium chloride 0.65 % Soln nasal spray Commonly known as: OCEAN   tamsulosin 0.4 MG Caps capsule Commonly known as: FLOMAX   THEREMS PO       TAKE these medications    donepezil 10 MG tablet Commonly known as: ARICEPT Take 10 mg by mouth at bedtime.   glycopyrrolate 1 MG tablet Commonly known as: ROBINUL Take 1 tablet (1 mg total) by mouth every 4 (four) hours as needed (excessive secretions).   haloperidol 0.5 MG tablet Commonly known as: HALDOL Take 1 tablet (0.5 mg total) by mouth every 4 (four) hours as needed for agitation (or delirium).   morphine CONCENTRATE 10 MG/0.5ML Soln concentrated  solution Place 0.25 mLs (5 mg total) under the tongue every 6 (six) hours.   polyvinyl alcohol 1.4 % ophthalmic solution Commonly known as: LIQUIFILM TEARS Place 1 drop into both eyes 4 (four) times daily as needed for dry eyes.        Allergies  Allergen Reactions   Penicillins     Per MAR    The results of significant diagnostics from this hospitalization (including imaging, microbiology, ancillary and laboratory) are listed below for reference.    Microbiology: Recent Results (from the past 240 hour(s))  Resp Panel by RT-PCR (Flu A&B, Covid) Nasopharyngeal Swab     Status: None   Collection Time: 12/10/21  9:55 AM   Specimen: Nasopharyngeal Swab; Nasopharyngeal(NP) swabs in vial transport medium  Result Value Ref Range Status   SARS Coronavirus 2 by RT PCR NEGATIVE NEGATIVE Final    Comment: (NOTE) SARS-CoV-2 target nucleic acids are NOT DETECTED.  The SARS-CoV-2 RNA is generally detectable in upper respiratory specimens during the acute phase of infection. The lowest concentration of SARS-CoV-2 viral copies this assay can detect is 138 copies/mL. A negative result does not preclude SARS-Cov-2 infection and should not be used as the sole basis for treatment  or other patient management decisions. A negative result may occur with  improper specimen collection/handling, submission of specimen other than nasopharyngeal swab, presence of viral mutation(s) within the areas targeted by this assay, and inadequate number of viral copies(<138 copies/mL). A negative result must be combined with clinical observations, patient history, and epidemiological information. The expected result is Negative.  Fact Sheet for Patients:  EntrepreneurPulse.com.au  Fact Sheet for Healthcare Providers:  IncredibleEmployment.be  This test is no t yet approved or cleared by the [place] FDA and  has been authorized for detection and/or diagnosis of  SARS-CoV-2 by FDA under an Emergency Use Authorization (EUA). This EUA will remain  in effect (meaning this test can be used) for the duration of the COVID-19 declaration under Section 564(b)(1) of the Act, 21 U.S.C.section 360bbb-3(b)(1), unless the authorization is terminated  or revoked sooner.       Influenza A by PCR NEGATIVE NEGATIVE Final   Influenza B by PCR NEGATIVE NEGATIVE Final    Comment: (NOTE) The Xpert Xpress SARS-CoV-2/FLU/RSV plus assay is intended as an aid in the diagnosis of influenza from Nasopharyngeal swab specimens and should not be used as a sole basis for treatment. Nasal washings and aspirates are unacceptable for Xpert Xpress SARS-CoV-2/FLU/RSV testing.  Fact Sheet for Patients: EntrepreneurPulse.com.au  Fact Sheet for Healthcare Providers: IncredibleEmployment.be  This test is not yet approved or cleared by the [place] FDA and has been authorized for detection and/or diagnosis of SARS-CoV-2 by FDA under an Emergency Use Authorization (EUA). This EUA will remain in effect (meaning this test can be used) for the duration of the COVID-19 declaration under Section 564(b)(1) of the Act, 21 U.S.C. section 360bbb-3(b)(1), unless the authorization is terminated or revoked.  Performed at [facility], [place] [address] [place], [place] [address]     Procedures/Studies: DG Chest 2 View  Result Date: 12/10/2021 CLINICAL DATA:  Code stroke EXAM: CHEST - 2 VIEW COMPARISON:  None. FINDINGS: The heart is enlarged. Aorta is tortuous with atherosclerotic calcifications. Lungs are clear without evidence of focal consolidation or pleural effusion. No acute osseous abnormality. IMPRESSION: Cardiomegaly without evidence of acute cardiopulmonary process. Electronically Signed   By: Keane Police D.O.   On: 12/10/2021 14:38   MR BRAIN WO CONTRAST  Result Date: 12/10/2021 CLINICAL DATA:  Stroke, follow-up EXAM: MRI HEAD  WITHOUT CONTRAST TECHNIQUE: Multiplanar, multiecho pulse sequences of the brain and surrounding structures were obtained without intravenous contrast. COMPARISON:  CT/CTA head dated earlier the same day FINDINGS: Brain: There is diffusion restriction with associated T2/FLAIR signal abnormality throughout the right PCA distribution involving the hippocampus, occipital lobe, and small portions of the right thalamus and distal caudate body consistent with acute infarct. There is sulcal effacement but no midline shift. There is no evidence of hemorrhagic transformation. There is a background of moderate to advanced global parenchymal volume loss with ex vacuo dilation of the ventricular system. Confluent FLAIR signal abnormality in the remainder of the subcortical and periventricular white matter likely reflects sequela of chronic white matter microangiopathy. There is a small remote infarct in the right cerebellar hemisphere and additional lacunar infarcts in the left basal ganglia. There is no solid mass lesion. Vascular: The major flow voids are preserved. 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 Skull and upper cervical spine: Normal marrow signal. Sinuses/Orbits: The paranasal sinuses are clear. A left lens implant is noted. The globes and orbits are otherwise unremarkable. Other: None. IMPRESSION: 1. Acute right PCA territorial  infarct without hemorrhagic transformation. 2. Advanced global parenchymal volume loss and chronic white matter microangiopathy. Electronically Signed   By: Valetta Mole M.D.   On: 12/10/2021 16:30   VAS [place] EVAR DUPLEX  Result Date: 11/23/2021 Endovascular Aortic Repair Study (EVAR) Patient Name:  Benjamin Grant  Date of Exam:   11/23/2021 Medical Rec #: [id number]            Accession #:    [id number] Date of Birth: 1930-08-20            Patient Gender: M Patient Age:   66 years Exam Location:  [facility] Procedure:      VAS [place] EVAR DUPLEX Referring  Phys: Hortencia Pilar --------------------------------------------------------------------------------  Indications: Follow up exam for EVAR.  Comparison Study: 11/24/2020 Performing Technologist: Almira Coaster RVS  Examination Guidelines: A complete evaluation includes B-mode imaging, spectral Doppler, color Doppler, and power Doppler as needed of all accessible portions of each vessel. Bilateral testing is considered an integral part of a complete examination. Limited examinations for reoccurring indications may be performed as noted.  Endovascular Aortic Repair (EVAR): +----------+----------------+-------------------+-------------------+             Diameter AP (cm) Diameter Trans (cm) Velocities (cm/sec)  +----------+----------------+-------------------+-------------------+  Aorta      2.76             2.86                39                   +----------+----------------+-------------------+-------------------+  Right Limb 1.39             1.66                45                   +----------+----------------+-------------------+-------------------+  Left Limb  1.31             1.34                114                  +----------+----------------+-------------------+-------------------+  Summary: Abdominal Aorta: Patent endovascular aneurysm repair with no evidence of endoleak. Mild increase in Diameter of the EVAR from 2.6 cm to 2.86 cm.  *See table(s) above for measurements and observations.  Electronically signed by Hortencia Pilar MD on 11/23/2021 at 8:29:06 PM.   Final    ECHOCARDIOGRAM COMPLETE  Result Date: 12/11/2021    ECHOCARDIOGRAM REPORT   Patient Name:   Benjamin Grant Date of Exam: 12/11/2021 Medical Rec #:  [id number]           Height:       72.0 in Accession #:    [id number]          Weight:       180.8 lb Date of Birth:  Feb 19, 1930           BSA:          2.041 m Patient Age:    63 years            BP:           149/110 mmHg Patient Gender: M                   HR:           122 bpm.  Exam Location:  Inpatient Procedure: 2D Echo, Cardiac  Doppler and Color Doppler Indications:    Stroke  History:        Patient has no prior history of Echocardiogram examinations.                 Risk Factors:Hypertension and Diabetes.  Sonographer:    Jyl Heinz Referring Phys: [place]  1. Technically difficult study with limited views, and patient in Afib with RVR. Left ventricular ejection fraction, by estimation, is 50 to 55%. The left ventricle has low normal function. Left ventricular endocardial border not optimally defined to evaluate regional wall motion. There is mild left ventricular hypertrophy. Left ventricular diastolic parameters are indeterminate.  2. Right ventricular systolic function is normal. The right ventricular size is normal.  3. The mitral valve is grossly normal. No evidence of mitral valve regurgitation.  4. The aortic valve was not well visualized. Aortic valve regurgitation is not visualized.  5. The inferior vena cava is normal in size with greater than 50% respiratory variability, suggesting right atrial pressure of 3 mmHg. FINDINGS  Left Ventricle: Left ventricular ejection fraction, by estimation, is 50 to 55%. The left ventricle has low normal function. Left ventricular endocardial border not optimally defined to evaluate regional wall motion. The left ventricular internal cavity  size was normal in size. There is mild left ventricular hypertrophy. Left ventricular diastolic parameters are indeterminate. Right Ventricle: The right ventricular size is normal. Right vetricular wall thickness was not well visualized. Right ventricular systolic function is normal. Left Atrium: Left atrial size was not well visualized. Right Atrium: Right atrial size was not well visualized. Pericardium: Trivial pericardial effusion is present. Mitral Valve: The mitral valve is grossly normal. No evidence of mitral valve regurgitation. Tricuspid Valve: The tricuspid valve is  grossly normal. Tricuspid valve regurgitation is trivial. Aortic Valve: The aortic valve was not well visualized. Aortic valve regurgitation is not visualized. Pulmonic Valve: The pulmonic valve was not well visualized. Pulmonic valve regurgitation is not visualized. Aorta: The aortic root and ascending aorta are structurally normal, with no evidence of dilitation. Venous: The inferior vena cava is normal in size with greater than 50% respiratory variability, suggesting right atrial pressure of 3 mmHg. IAS/Shunts: The interatrial septum was not well visualized.  LEFT VENTRICLE PLAX 2D LVIDd:         3.80 cm LVIDs:         2.60 cm LV PW:         1.50 cm LV IVS:        1.10 cm LVOT diam:     2.20 cm LVOT Area:     3.80 cm  IVC IVC diam: 1.90 cm LEFT ATRIUM         Index LA diam:    3.80 cm 1.86 cm/m   AORTA Ao Root diam: 3.80 cm Ao Asc diam:  3.50 cm TRICUSPID VALVE TR Peak grad:   12.4 mmHg TR Vmax:        176.00 cm/s  SHUNTS Systemic Diam: 2.20 cm Oswaldo Milian MD Electronically signed by Oswaldo Milian MD Signature Date/Time: 12/11/2021/10:50:37 AM    Final    CT HEAD CODE STROKE WO CONTRAST  Result Date: 12/10/2021 CLINICAL DATA:  Code stroke.  Acute stroke suspected EXAM: CT HEAD WITHOUT CONTRAST TECHNIQUE: Contiguous axial images were obtained from the base of the skull through the vertex without intravenous contrast. COMPARISON:  01/10/2012 FINDINGS: Brain: Cytotoxic edema in the right PCA territory at the level of the occipital and medial temporal  lobes. No acute hemorrhage. Advanced brain atrophy. Chronic small vessel ischemia in the periventricular white matter. Vascular: Vertebrobasilar dolichoectasia.  No hyperdense vessel. Skull: Normal. Negative for fracture or focal lesion. Sinuses/Orbits: No acute finding. Other: These results were communicated to Dr. Rory Percy at 9:32 am on 12/10/2021 by text page via the [facility] messaging system. IMPRESSION: 1. Acute right PCA distribution infarct at  the level of the occipital and temporal lobes. No acute hemorrhage. 2. Advanced brain atrophy. 3. Vertebrobasilar dolichoectasia. Electronically Signed   By: Jorje Guild M.D.   On: 12/10/2021 09:33   VAS [place] LOWER EXTREMITY ARTERIAL DUPLEX  Result Date: 11/23/2021 LOWER EXTREMITY ARTERIAL DUPLEX STUDY Patient Name:  Benjamin Grant  Date of Exam:   11/23/2021 Medical Rec #: [id number]            Accession #:    [id number] Date of Birth: 1930/01/31            Patient Gender: M Patient Age:   8 years Exam Location:  [facility] Procedure:      VAS [place] LOWER EXTREMITY ARTERIAL DUPLEX Referring Phys: Hortencia Pilar --------------------------------------------------------------------------------  Indications: AAA, ?Pop art aneurysm.  Vascular Interventions: EVAR 11/28/2009. Current ABI:            Not Obtained Comparison Study: 08/11/2018 Performing Technologist: Almira Coaster RVS  Examination Guidelines: A complete evaluation includes B-mode imaging, spectral Doppler, color Doppler, and power Doppler as needed of all accessible portions of each vessel. Bilateral testing is considered an integral part of a complete examination. Limited examinations for reoccurring indications may be performed as noted.  +----------+--------+-----+--------+--------+--------+  RIGHT      PSV cm/s Ratio Stenosis Waveform Comments  +----------+--------+-----+--------+--------+--------+  POP Distal 19                                         +----------+--------+-----+--------+--------+--------+ +---------------+-------+-----------+--------+--------+-----+--------+  Right Popliteal AP (cm) Transv (cm) Waveform Stenosis Shape Comments  +---------------+-------+-----------+--------+--------+-----+--------+  Proximal        1.01    0.97        biphasic                          +---------------+-------+-----------+--------+--------+-----+--------+  Distal          1.22    1.32        biphasic                           +---------------+-------+-----------+--------+--------+-----+--------+ +--------------+-------+-----------+--------+--------+-----+--------+  Left Popliteal AP (cm) Transv (cm) Waveform Stenosis Shape Comments  +--------------+-------+-----------+--------+--------+-----+--------+  Proximal       0.90    0.89        biphasic                          +--------------+-------+-----------+--------+--------+-----+--------+  Distal         1.07    1.08        biphasic                          +--------------+-------+-----------+--------+--------+-----+--------+  +----------+--------+-----+--------+--------+--------+  LEFT       PSV cm/s Ratio Stenosis Waveform Comments  +----------+--------+-----+--------+--------+--------+  POP Prox   13                                         +----------+--------+-----+--------+--------+--------+  POP Distal 18                                         +----------+--------+-----+--------+--------+--------+  Summary: Right: The Right Popliteal Artery displays no evidence of Aneurysm. Left: The Left Popliteal Artery displays no evidence of Aneurysm.  See table(s) above for measurements and observations. Electronically signed by Hortencia Pilar MD on 11/23/2021 at 8:29:23 PM.    Final    CT ANGIO HEAD NECK W WO CM (CODE STROKE)  Result Date: 12/10/2021 CLINICAL DATA:  Acute neuro deficit. EXAM: CT ANGIOGRAPHY HEAD AND NECK TECHNIQUE: Multidetector CT imaging of the head and neck was performed using the standard protocol during bolus administration of intravenous contrast. Multiplanar CT image reconstructions and MIPs were obtained to evaluate the vascular anatomy. Carotid stenosis measurements (when applicable) are obtained utilizing NASCET criteria, using the distal internal carotid diameter as the denominator. CONTRAST:  85mL OMNIPAQUE IOHEXOL 350 MG/ML SOLN COMPARISON:  Head CT from the same day FINDINGS: CTA NECK FINDINGS Aortic arch: Atheromatous plaque with 3 vessel branching. Right  carotid system: No evidence of dissection, stenosis (50% or greater) or occlusion. Mild for age atheromatous plaque Left carotid system: No evidence of dissection, stenosis (50% or greater) or occlusion. Mild for age atheromatous plaque Vertebral arteries: Subclavian atherosclerosis on both sides. Dominant left vertebral artery. Skeleton: Advanced cervical spine degeneration Other neck: No acute finding Upper chest: Emphysema. Review of the MIP images confirms the above findings CTA HEAD FINDINGS Anterior circulation: Atheromatous calcification of the carotid siphons. Tandem severe stenoses at the right M1 and right MCA bifurcations with underfilling and subsequent atheromatous irregularity of branch vessels. Atheromatous irregularity and narrowing of left MCA vessels, especially at the M3 level and beyond. Negative for aneurysm Posterior circulation: Dominant left vertebral artery. Vertebrobasilar dolichoectasia and elongation. Basilar measures 6-7 mm in diameter. Right PCA branch occlusion with subsequent reconstitution. There is faint flow in the posterior circulation due to the tortuous nature and dilatation. Venous sinuses: Unremarkable for the arterial phase Anatomic variants: None significant Review of the MIP images confirms the above findings Case discussed with Dr. Rory Percy. IMPRESSION: 1. Short segment occlusion of the right PCA correlating with the acute infarct. 2. Severe tandem right MCA stenoses. 3. Vertebrobasilar dolichoectasia. Electronically Signed   By: Jorje Guild M.D.   On: 12/10/2021 09:57    Labs: BNP (last 3 results) No results for input(s): BNP in the last 8760 hours. Basic Metabolic Panel: Recent Labs  Lab 12/10/21 0917 12/10/21 0923  NA 140 141  K 4.0 3.9  CL 107 107  CO2 18*  --   GLUCOSE 201* 203*  BUN 21 26*  CREATININE 1.06 0.90  CALCIUM 9.3  --    Liver Function Tests: Recent Labs  Lab 12/10/21 0917  AST 19  ALT 22  ALKPHOS 68  BILITOT 0.8  PROT 6.5   ALBUMIN 3.5   No results for input(s): LIPASE, AMYLASE in the last 168 hours. No results for input(s): AMMONIA in the last 168 hours. CBC: Recent Labs  Lab 12/10/21 0917 12/10/21 0923  WBC 13.3*  --   NEUTROABS 10.6*  --   HGB 17.1* 17.3*  HCT 55.2* 51.0  MCV 107.0*  --   PLT 171  --    Cardiac Enzymes: No results for input(s): CKTOTAL, CKMB, CKMBINDEX, TROPONINI in the last 168 hours. BNP: Invalid input(s): POCBNP CBG: Recent Labs  Lab 12/10/21 1637 12/11/21 0005 12/11/21 0310 12/11/21 0743 12/11/21 1141  GLUCAP 105* 90 104* 117* 140*   D-Dimer No results for input(s): DDIMER in the last 72 hours. Hgb A1c Recent Labs    12/11/21 0308  HGBA1C 8.4*   Lipid Profile Recent Labs    12/11/21 0308  CHOL 154  HDL 48  LDLCALC 90  TRIG 79  CHOLHDL 3.2   Thyroid function studies No results for input(s): TSH, T4TOTAL, T3FREE, THYROIDAB in the last 72 hours.  Invalid input(s): FREET3 Anemia work up No results for input(s): VITAMINB12, FOLATE, FERRITIN, TIBC, IRON, RETICCTPCT in the last 72 hours. Urinalysis    Component Value Date/Time   COLORURINE Yellow 01/14/2014 2330   APPEARANCEUR Clear 10/15/2018 1048   LABSPEC 1.024 01/14/2014 2330   PHURINE 5.0 01/14/2014 2330   GLUCOSEU 1+ (A) 10/15/2018 1048   GLUCOSEU 50 mg/dL 01/14/2014 2330   HGBUR Negative 01/14/2014 2330   BILIRUBINUR Negative 10/15/2018 1048   BILIRUBINUR Negative 01/14/2014 2330   KETONESUR Negative 01/14/2014 2330   PROTEINUR Negative 10/15/2018 1048   PROTEINUR 30 mg/dL 01/14/2014 2330   NITRITE Negative 10/15/2018 1048   NITRITE Negative 01/14/2014 2330   LEUKOCYTESUR Negative 10/15/2018 1048   LEUKOCYTESUR Negative 01/14/2014 2330   Sepsis Labs Invalid input(s): PROCALCITONIN,  WBC,  LACTICIDVEN Microbiology Recent Results (from the past 240 hour(s))  Resp Panel by RT-PCR (Flu A&B, Covid) Nasopharyngeal Swab     Status: None   Collection Time: 12/10/21  9:55 AM   Specimen:  Nasopharyngeal Swab; Nasopharyngeal(NP) swabs in vial transport medium  Result Value Ref Range Status   SARS Coronavirus 2 by RT PCR NEGATIVE NEGATIVE Final    Comment: (NOTE) SARS-CoV-2 target nucleic acids are NOT DETECTED.  The SARS-CoV-2 RNA is generally detectable in upper respiratory specimens during the acute phase of infection. The lowest concentration of SARS-CoV-2 viral copies this assay can detect is 138 copies/mL. A negative result does not preclude SARS-Cov-2 infection and should not be used as the sole basis for treatment or other patient management decisions. A negative result may occur with  improper specimen collection/handling, submission of specimen other than nasopharyngeal swab, presence of viral mutation(s) within the areas targeted by this assay, and inadequate number of viral copies(<138 copies/mL). A negative result must be combined with clinical observations, patient history, and epidemiological information. The expected result is Negative.  Fact Sheet for Patients:  EntrepreneurPulse.com.au  Fact Sheet for Healthcare Providers:  IncredibleEmployment.be  This test is no t yet approved or cleared by the [place] FDA and  has been authorized for detection and/or diagnosis of SARS-CoV-2 by FDA under an Emergency Use Authorization (EUA). This EUA will remain  in effect (meaning this test can be used) for the duration of the COVID-19 declaration under Section 564(b)(1) of the Act, 21 U.S.C.section 360bbb-3(b)(1), unless the authorization is terminated  or revoked sooner.       Influenza A by PCR NEGATIVE NEGATIVE Final   Influenza B by PCR NEGATIVE NEGATIVE Final    Comment: (NOTE) The Xpert Xpress SARS-CoV-2/FLU/RSV plus assay is intended as an aid in the diagnosis of influenza from Nasopharyngeal swab specimens and should not be used as a sole basis for treatment. Nasal washings and aspirates are unacceptable for  Xpert Xpress SARS-CoV-2/FLU/RSV testing.  Fact Sheet for Patients: EntrepreneurPulse.com.au  Fact Sheet for Healthcare Providers: IncredibleEmployment.be  This test is not yet approved or cleared by the [place] FDA and has been authorized for detection and/or diagnosis  of SARS-CoV-2 by FDA under an Emergency Use Authorization (EUA). This EUA will remain in effect (meaning this test can be used) for the duration of the COVID-19 declaration under Section 564(b)(1) of the Act, 21 U.S.C. section 360bbb-3(b)(1), unless the authorization is terminated or revoked.  Performed at [facility], [place] [address] [place], [place] [address]      Time coordinating discharge: 25 minutes  SIGNED: Antonieta Pert, MD  [facility] 12/11/2021, 3:02 PM  If 7PM-7AM, please contact night-coverage [email or web address]

## 2021-12-11 NOTE — Consult Note (Signed)
Consultation Note Date: 12/11/2021   Patient Name: Benjamin Grant  DOB: 1930/07/08  MRN: [id number]  Age / Sex: 87 y.o., male  PCP: Venia Carbon, MD Referring Physician: Antonieta Pert, MD  Reason for Consultation: Establishing goals of care "Goals of care in the setting of acute CVA, dementia"  HPI/Patient Profile: 87 y.o. male  with past medical history of AAA, a-fib on Eliquis, dementia, DM, HTN, and HLD who presented to the emergency department on 12/10/2021 with left-side weakness. Code stroke called in the ED, but patient was not a candidate for TPA due to prolonged time since last known normal. MRI brain revealed large right acute right PCA territorial infarct without hemorrhagic transformation and advanced parenchymal volume loss and chronic white matter microangiopathy. Admitted to [facility] with acute CVA.   Clinical Assessment and Goals of Care: I have reviewed medical records including EPIC notes, labs and imaging, and went to see patient in the ED. He appears to be resting comfortably at this time. His daughter Magda Paganini is at bedside at reports he has been minimally responsive, except for he seemed very uncomfortable during turning/cleaning after an episode of incontinence.   Magda Paganini and I went to the ED consult room to discuss diagnosis, prognosis, GOC, EOL wishes, disposition, and options. I introduced Palliative Medicine as specialized medical care for people living with serious illness. It focuses on providing relief from the symptoms and stress of a serious illness.   We discussed a brief life review of the patient. He is originally from [facility]. He is a retired Editor, commissioning. His wife passed away in 03/31/19. They had 3 children together. Both daughters live in [place]. Son/David lives in the [place] area.  Created space and opportunity for Magda Paganini to express thoughts and feelings regarding  patient's current medical situation. She is quite overwhelmed and tearful. She shares having recently gone though this exact same scenario with her mother's death and continues to grieve this loss. She also shares that her sister is recovering from surgery for breast cancer last week and is unable to travel from [place]. Emotional support provided.  Patient has resided at [facility] in [place] for the past 4-5 years. Initially he and his wife lived together in an apartment (independent living). He transitioned to the memory care unit as his cognitive and functional status declined. Discussed that dementia is a progressive and non-curable illness.   We discussed patient's current medical condition and what it means in the larger context of his ongoing co-morbidities. Discussed the natural disease trajectory of a sudden, severe neurological injury such as stroke. Provided education on natural trajectory at EOL.   The difference between full scope medical intervention and comfort care was considered. I confirmed that goal of care is comfort.  Reviewed that comfort care means stopping full scope medical interventions, allowing a natural course to occur. Discussed that the goal is comfort and dignity rather than cure/prolonging life. Discussed specifics of comfort care including no labs, no artificial hydration or feeding, no antibiotics, minimizing of medications,  comfort feeds, and medication for symptom management.   Magda Paganini shares that her mother passed at [facility] under the care of [facility] hosp[facility]. She wants to pursue the same care for her father. Discussed hospice services in the context of transferring back to the memory care unit with hospice versus transferring to the hospice facility in [place]. Magda Paganini states that [facility] is willing and able to provided excellent care at EOL with the addition of hospice support.   Primary decision maker: Lenon Oms    SUMMARY OF  RECOMMENDATIONS   Full comfort measures  Transfer back to [facility] with hospice ([facility]) - TOC consult placed; TOC and hospice liaison notifed Added orders for symptom management at EOL as well as discontinued orders that were not focused on comfort PMT will continue to follow   Symptom Management:  Scheduled morphine concentrate solution 5 mg every 6 hours Lorazepam (ATIVAN) prn for anxiety Haloperidol (HALDOL) prn for agitation  Glycopyrrolate (ROBINUL) for excessive secretions Ondansetron (ZOFRAN) prn for nausea Polyvinyl alcohol (LIQUIFILM TEARS) prn for dry eyes Antiseptic oral rinse (BIOTENE) prn for dry mouth   Code Status/Advance Care Planning: DNR  Additional Recommendations (Limitations, Scope, Preferences): Full Comfort Care  Prognosis:  < 2 weeks  Discharge Planning: Hospice facility      Primary Diagnoses: Present on Admission:  Acute CVA (cerebrovascular accident) ([place])  Essential hypertension  Hyperlipidemia  PAF (paroxysmal atrial fibrillation) (HCC)  BPH (benign prostatic hyperplasia)  DNR (do not resuscitate)   I have reviewed the medical record, interviewed the patient and family, and examined the patient. The following aspects are pertinent.  Past Medical History:  Diagnosis Date   AAA (abdominal aortic aneurysm) ([place])    Aneurysm of artery (HCC)    popliteal   Atrial fibrillation (HCC)    Dementia (HCC)    Depression    Diabetes mellitus without complication ([place])    Diverticulosis large intestine w/o perforation or abscess w/bleeding    GERD without esophagitis    Gross hematuria    Hyperlipidemia    Hypertension    Ventricular tachycardia ([place])     No family history on file. Scheduled Meds:  aspirin  300 mg Rectal Daily   Or   aspirin  325 mg Oral Daily   lidocaine  1 application Urethral Once   Continuous Infusions:  sodium chloride 50 mL/hr at 12/10/21 1346   PRN Meds:.acetaminophen **OR** acetaminophen (TYLENOL) oral  liquid 160 mg/5 mL **OR** acetaminophen, antiseptic oral rinse, diphenhydrAMINE, glycopyrrolate **OR** glycopyrrolate **OR** glycopyrrolate, haloperidol **OR** haloperidol **OR** haloperidol lactate, magic mouthwash, morphine injection, morphine CONCENTRATE **OR** morphine CONCENTRATE, ondansetron **OR** ondansetron (ZOFRAN) IV, polyvinyl alcohol   Allergies  Allergen Reactions   Penicillins     Per MAR   Review of Systems  Unable to perform ROS: Mental status change   Physical Exam Vitals reviewed.  Constitutional:      General: He is not in acute distress.    Appearance: He is ill-appearing.  Pulmonary:     Effort: Pulmonary effort is normal.  Neurological:     Mental Status: He is lethargic.     Motor: Weakness present.  Psychiatric:        Cognition and Memory: Cognition is impaired.    Vital Signs: BP (!) 172/116    Pulse (!) 129    Temp 97.7 F (36.5 C) (Oral)    Resp (!) 31    Ht 6' (1.829 m)    Wt 82 kg    SpO2 95%  BMI 24.52 kg/m  Pain Scale: 0-10   Pain Score: Asleep   SpO2: SpO2: 95 % O2 Device:SpO2: 95 % O2 Flow Rate: .      Palliative Assessment/Data: PPS 10%     Time In: 1220 Time Out: 1332 Time Total: 72 minutes Greater than 50%  of this time was spent counseling and coordinating care related to the above assessment and plan.  Signed by: Lavena Bullion, NP   Please contact Palliative Medicine Team phone at [phone] for questions and concerns.  For individual provider: See Shea Evans

## 2021-12-11 NOTE — ED Notes (Signed)
Family at the bedside.

## 2021-12-11 NOTE — Progress Notes (Signed)
CSW contacted [facility] to notify them of patients return. The nurse will need to call report prior to discharge

## 2021-12-11 NOTE — ED Notes (Signed)
Breakfast orders placed 

## 2021-12-11 NOTE — Telephone Encounter (Signed)
Benjamin Grant has called the office to make Dr.Letvak aware that the patient has been admitted to the ED for stroke.  Having concerns of secondary stroke & pt is still in the ER.  Benjamin- [phone]

## 2021-12-12 DIAGNOSIS — I480 Paroxysmal atrial fibrillation: Secondary | ICD-10-CM | POA: Diagnosis not present

## 2021-12-12 DIAGNOSIS — E1159 Type 2 diabetes mellitus with other circulatory complications: Secondary | ICD-10-CM | POA: Diagnosis not present

## 2021-12-12 DIAGNOSIS — G8194 Hemiplegia, unspecified affecting left nondominant side: Secondary | ICD-10-CM | POA: Diagnosis not present

## 2021-12-12 NOTE — ED Notes (Signed)
Patient is currently 3rd on the list for pick up through [facility]

## 2021-12-12 NOTE — ED Notes (Signed)
[facility] arrived. Upon removing Primo, pt urinated. RN facilitated brief and linen change. Report given w/ facesheet, CMN, and AVS paperwork. DNR document and pt's belongings given to [facility] staff.

## 2021-12-24 DEATH — deceased

## 2022-11-26 ENCOUNTER — Encounter (INDEPENDENT_AMBULATORY_CARE_PROVIDER_SITE_OTHER): Payer: Medicare Other

## 2022-11-26 ENCOUNTER — Ambulatory Visit (INDEPENDENT_AMBULATORY_CARE_PROVIDER_SITE_OTHER): Payer: Medicare Other | Admitting: Vascular Surgery
